# Patient Record
Sex: Female | Born: 1940 | Race: White | Hispanic: No | Smoking: Never smoker
Health system: Southern US, Community
[De-identification: ages and names within clinical notes are randomized; demographics above are authoritative.]

## PROBLEM LIST (undated history)

## (undated) DIAGNOSIS — E079 Disorder of thyroid, unspecified: Secondary | ICD-10-CM

## (undated) DIAGNOSIS — E785 Hyperlipidemia, unspecified: Secondary | ICD-10-CM

## (undated) DIAGNOSIS — J449 Chronic obstructive pulmonary disease, unspecified: Secondary | ICD-10-CM

## (undated) DIAGNOSIS — C55 Malignant neoplasm of uterus, part unspecified: Secondary | ICD-10-CM

## (undated) DIAGNOSIS — I10 Essential (primary) hypertension: Secondary | ICD-10-CM

## (undated) DIAGNOSIS — E039 Hypothyroidism, unspecified: Secondary | ICD-10-CM

## (undated) DIAGNOSIS — J45991 Cough variant asthma: Secondary | ICD-10-CM

## (undated) DIAGNOSIS — J45909 Unspecified asthma, uncomplicated: Secondary | ICD-10-CM

## (undated) DIAGNOSIS — J439 Emphysema, unspecified: Secondary | ICD-10-CM

## (undated) DIAGNOSIS — E119 Type 2 diabetes mellitus without complications: Secondary | ICD-10-CM

## (undated) HISTORY — PX: GANGLION CYST EXCISION: SHX1691

## (undated) HISTORY — PX: ABDOMINAL HYSTERECTOMY: SHX81

## (undated) HISTORY — DX: Type 2 diabetes mellitus without complications: E11.9

## (undated) HISTORY — DX: Unspecified asthma, uncomplicated: J45.909

## (undated) HISTORY — DX: Hyperlipidemia, unspecified: E78.5

## (undated) HISTORY — PX: CATARACT EXTRACTION, BILATERAL: SHX1313

## (undated) HISTORY — DX: Malignant neoplasm of uterus, part unspecified: C55

## (undated) HISTORY — PX: TONSILLECTOMY: SUR1361

## (undated) HISTORY — PX: TUBAL LIGATION: SHX77

## (undated) HISTORY — DX: Emphysema, unspecified: J43.9

## (undated) HISTORY — DX: Essential (primary) hypertension: I10

## (undated) HISTORY — DX: Cough variant asthma: J45.991

---

## 2005-07-07 ENCOUNTER — Other Ambulatory Visit: Payer: Self-pay

## 2005-07-07 ENCOUNTER — Ambulatory Visit: Payer: Self-pay | Admitting: Unknown Physician Specialty

## 2005-07-31 ENCOUNTER — Ambulatory Visit: Payer: Self-pay | Admitting: Unknown Physician Specialty

## 2006-12-15 IMAGING — MR MRI HEAD WITHOUT AND WITH CONTRAST
9 series · 48 of 48 positions shown · non-contrast
Comparison: none

REASON FOR EXAM: RIGHT-sided extremity numbness
COMMENTS:

[Series 2: t1_sag · axial · 10.0mm · 0.55mm/px · z∈[+0,+147]mm · 4 of 20 slices shown]
[im 1/20]
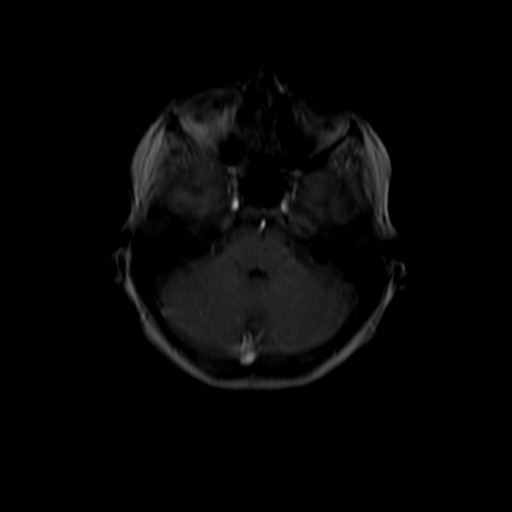
[im 7/20]
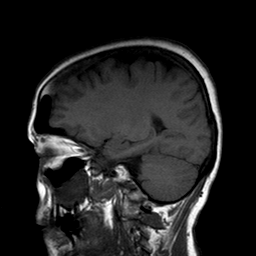
[im 13/20]
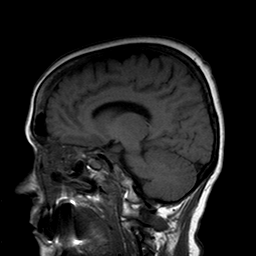
[im 20/20]
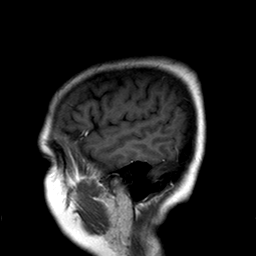

[Series 10: T1 · axial · 5.0mm · 0.90mm/px · z∈[-16,+147]mm · 5 of 24 slices shown (1 of 3)]
[im 1/24]
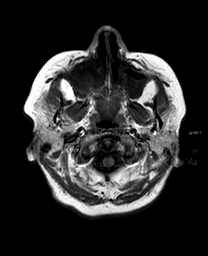
[im 6/24]
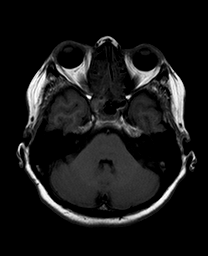
[im 12/24]
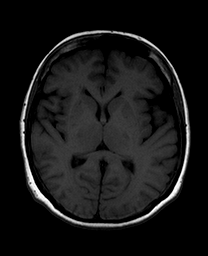
[im 18/24]
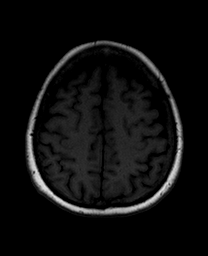
[im 24/24]
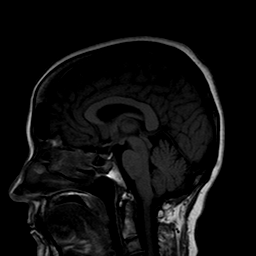

[Series 12: T2 · axial · 5.0mm · 0.45mm/px · z∈[-16,+147]mm · 5 of 24 slices shown]
[im 1/24]
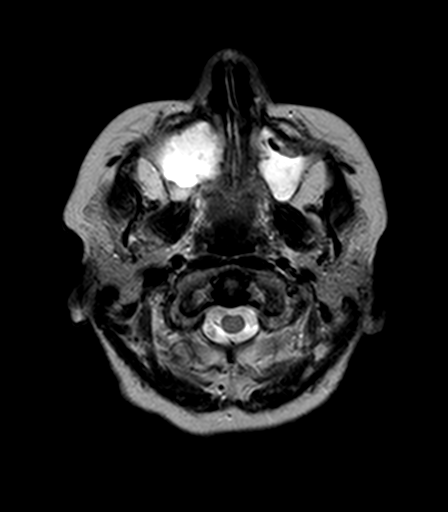
[im 6/24]
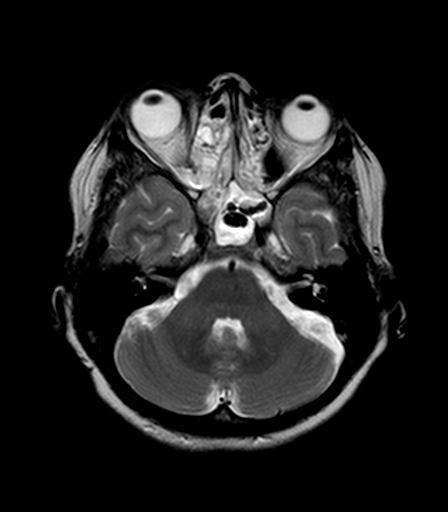
[im 12/24]
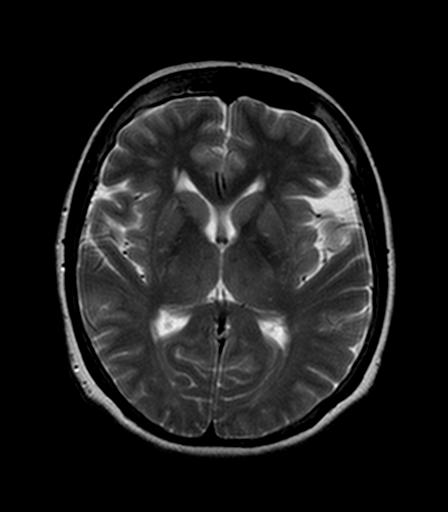
[im 18/24]
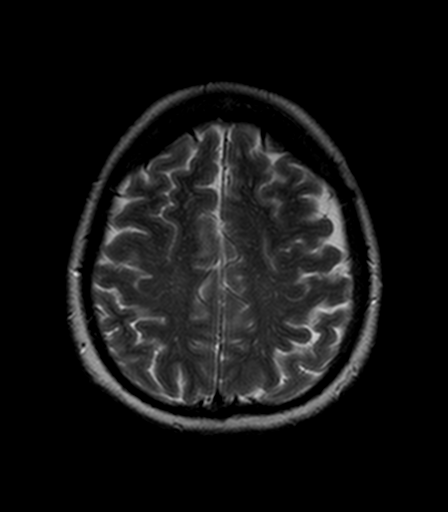
[im 24/24]
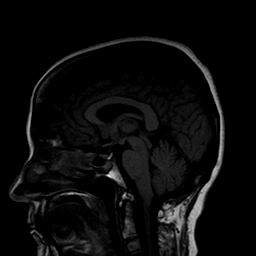

[Series 14: FLAIR · axial · 5.0mm · 0.90mm/px · z∈[-16,+147]mm · 6 of 24 slices shown]
[im 1/24]
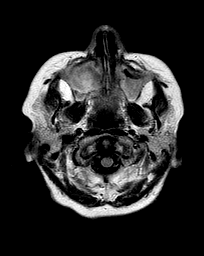
[im 5/24]
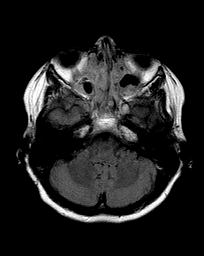
[im 10/24]
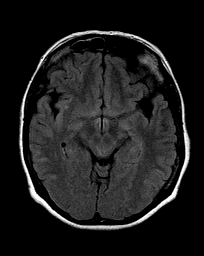
[im 14/24]
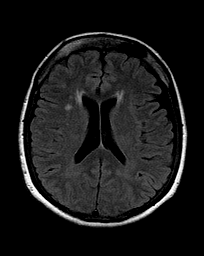
[im 19/24]
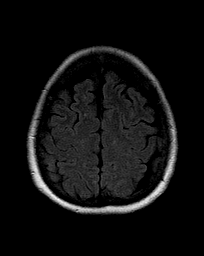
[im 24/24]
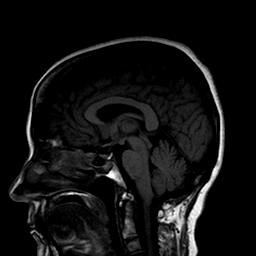

[Series 16: t1_cor · sagittal · 5.0mm · 0.90mm/px · 6 of 24 slices shown]
[im 1/24]
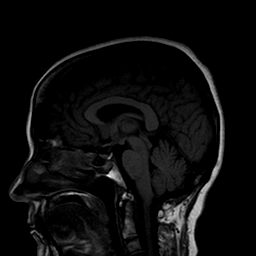
[im 5/24]
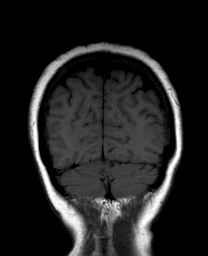
[im 10/24]
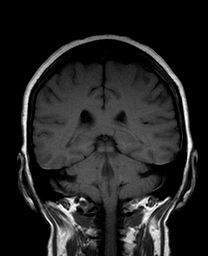
[im 14/24]
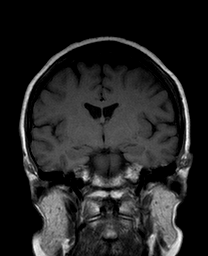
[im 19/24]
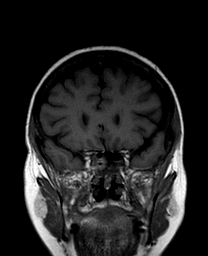
[im 24/24]
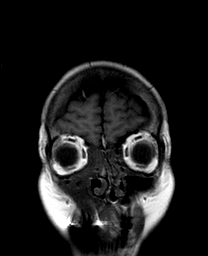

[Series 18: T1 · axial · 5.0mm · 0.45mm/px · z∈[-16,+147]mm · 6 of 24 slices shown (2 of 3)]
[im 1/24]
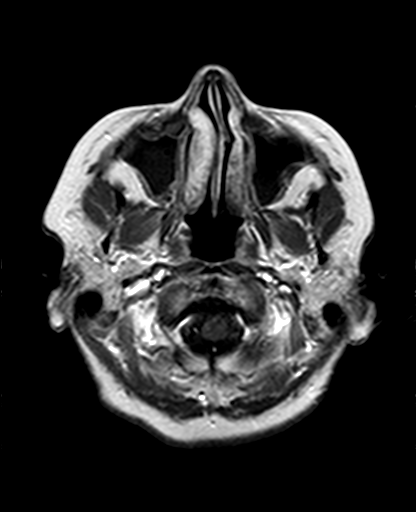
[im 5/24]
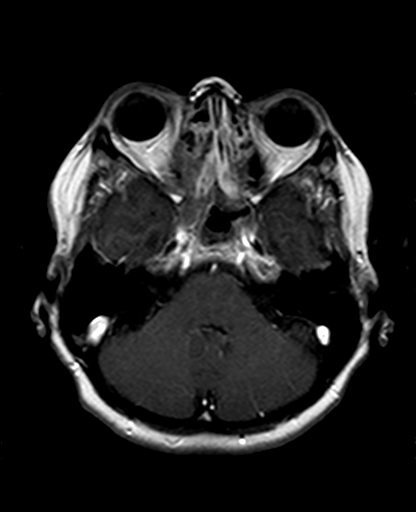
[im 10/24]
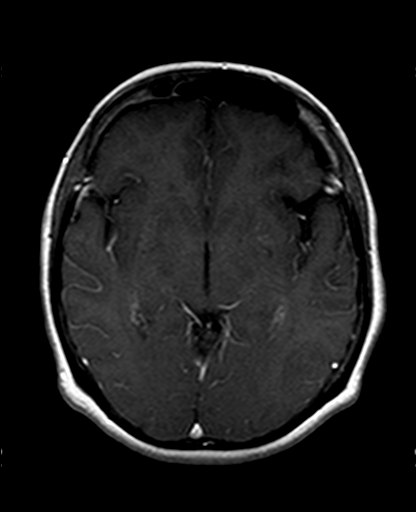
[im 14/24]
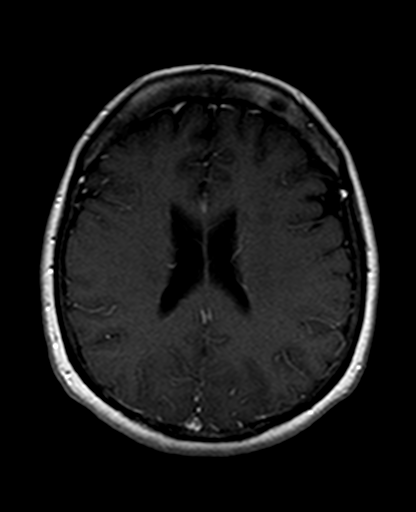
[im 19/24]
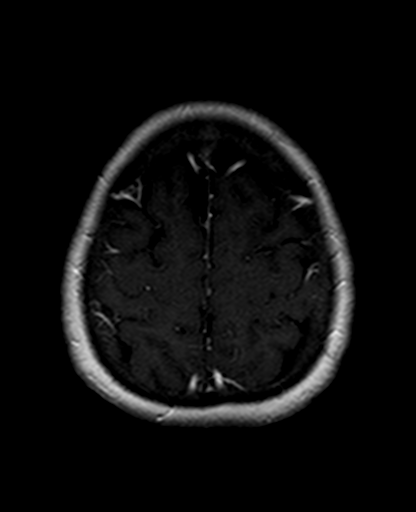
[im 24/24]
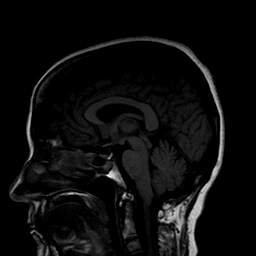

[Series 20: T1 · sagittal · 5.0mm · 0.90mm/px · 6 of 24 slices shown (3 of 3)]
[im 1/24]
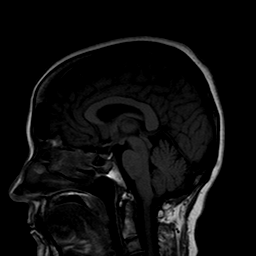
[im 5/24]
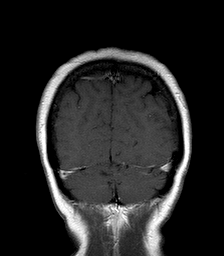
[im 10/24]
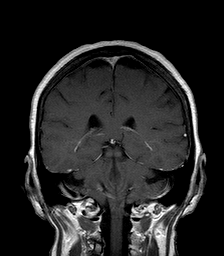
[im 14/24]
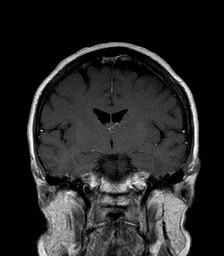
[im 19/24]
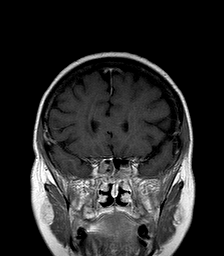
[im 24/24]
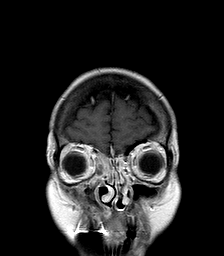

[Series 5002: DWI · axial · 5.0mm · 1.80mm/px · z∈[-16,+147]mm · 5 of 22 slices shown]
[im 1/22]
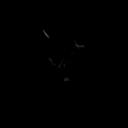
[im 6/22]
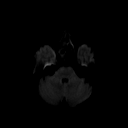
[im 11/22]
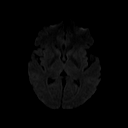
[im 16/22]
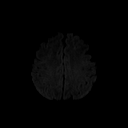
[im 22/22]
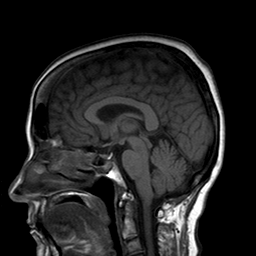

[Series 5003: ADC · axial · 5.0mm · 1.80mm/px · z∈[-16,+147]mm · 5 of 22 slices shown]
[im 1/22]
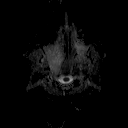
[im 6/22]
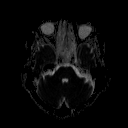
[im 11/22]
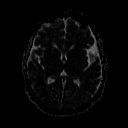
[im 16/22]
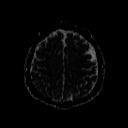
[im 22/22]
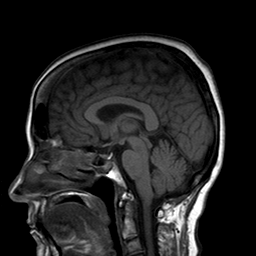

[48 of 48 positions shown; findings below may reference images not displayed]

PROCEDURE:     MR  - MR BRAIN WO/W CONTRAST  - July 07, 2005  [DATE]

RESULT:     Multiplanar/multisequence imaging of the brain was obtained
post-intravenous administration of 13 ml of IV Magnevist.

Evaluation of diffusion-weighted images demonstrates no evidence of
increased signal intensity to suggest sequela of an acute or subacute
infarct.  There is no evidence of intraaxial or extraaxial fluid collections
or evidence of abnormal parenchymal enhancement or enhancing masses.
Involutional changes are appreciated demonstrated by diffuse cortical
atrophy and areas of small vessel deep white matter and periventricular
white matter ischemia.  Considering the patient's age there does not appear
to be evidence of demyelinating or dysmyelinating disorders.  Diffuse T1
signal with concomitant decreased T2 signal is appreciated nearly completely
encompassing the RIGHT maxillary sinus.  A central area of enhancement is
identified and these findings appear to be consistent with RIGHT maxillary
sinusitis.  An air-fluid level is demonstrated within the LEFT maxillary
sinus with concomitant decreased T1, increased T2 signal and no evidence of
enhancement.  Air-fluid levels are also demonstrated within the sphenoid
sinuses and there is an opacification within the ethmoid air cells.

Evaluation of the sella and parasellar regions and structures demonstrate no
signal or enhancement abnormalities.  The cerebellum and midbrain regions as
well as cerebellopontine angle region and visualized portion of the seventh
and eighth cranial nerves demonstrate no signal or enhancement
abnormalities.  There is no evidence of free fluid or enhancing masses in
the region of the cerebellopontine angle regions. There does not appear to
be MR evidence to suggest sequela of mastoiditis.
IMPRESSION: Involutional changes as described above.

Findings which appear to be consistent with pansinusitis.

## 2011-04-02 DIAGNOSIS — H2510 Age-related nuclear cataract, unspecified eye: Secondary | ICD-10-CM | POA: Diagnosis not present

## 2011-04-13 ENCOUNTER — Ambulatory Visit: Payer: Self-pay | Admitting: Ophthalmology

## 2011-04-13 DIAGNOSIS — Z01810 Encounter for preprocedural cardiovascular examination: Secondary | ICD-10-CM | POA: Diagnosis not present

## 2011-04-13 DIAGNOSIS — I10 Essential (primary) hypertension: Secondary | ICD-10-CM

## 2011-04-13 DIAGNOSIS — Z01812 Encounter for preprocedural laboratory examination: Secondary | ICD-10-CM | POA: Diagnosis not present

## 2011-04-13 DIAGNOSIS — H2510 Age-related nuclear cataract, unspecified eye: Secondary | ICD-10-CM | POA: Diagnosis not present

## 2011-04-13 LAB — POTASSIUM: Potassium: 4.8 mmol/L (ref 3.5–5.1)

## 2011-04-21 ENCOUNTER — Ambulatory Visit: Payer: Self-pay | Admitting: Ophthalmology

## 2011-04-21 DIAGNOSIS — I10 Essential (primary) hypertension: Secondary | ICD-10-CM | POA: Diagnosis not present

## 2011-04-21 DIAGNOSIS — J45909 Unspecified asthma, uncomplicated: Secondary | ICD-10-CM | POA: Diagnosis not present

## 2011-04-21 DIAGNOSIS — Z9851 Tubal ligation status: Secondary | ICD-10-CM | POA: Diagnosis not present

## 2011-04-21 DIAGNOSIS — Z79899 Other long term (current) drug therapy: Secondary | ICD-10-CM | POA: Diagnosis not present

## 2011-04-21 DIAGNOSIS — Z9889 Other specified postprocedural states: Secondary | ICD-10-CM | POA: Diagnosis not present

## 2011-04-21 DIAGNOSIS — H269 Unspecified cataract: Secondary | ICD-10-CM | POA: Diagnosis not present

## 2011-04-21 DIAGNOSIS — H2510 Age-related nuclear cataract, unspecified eye: Secondary | ICD-10-CM | POA: Diagnosis not present

## 2011-06-02 DIAGNOSIS — H2510 Age-related nuclear cataract, unspecified eye: Secondary | ICD-10-CM | POA: Diagnosis not present

## 2011-06-04 DIAGNOSIS — J330 Polyp of nasal cavity: Secondary | ICD-10-CM | POA: Diagnosis not present

## 2011-06-04 DIAGNOSIS — R05 Cough: Secondary | ICD-10-CM | POA: Diagnosis not present

## 2011-06-04 DIAGNOSIS — R059 Cough, unspecified: Secondary | ICD-10-CM | POA: Diagnosis not present

## 2011-06-04 DIAGNOSIS — J45909 Unspecified asthma, uncomplicated: Secondary | ICD-10-CM | POA: Diagnosis not present

## 2011-06-09 ENCOUNTER — Ambulatory Visit: Payer: Self-pay | Admitting: Ophthalmology

## 2011-06-09 DIAGNOSIS — H2510 Age-related nuclear cataract, unspecified eye: Secondary | ICD-10-CM | POA: Diagnosis not present

## 2011-06-09 DIAGNOSIS — H269 Unspecified cataract: Secondary | ICD-10-CM | POA: Diagnosis not present

## 2011-06-09 DIAGNOSIS — I10 Essential (primary) hypertension: Secondary | ICD-10-CM | POA: Diagnosis not present

## 2011-06-09 DIAGNOSIS — I498 Other specified cardiac arrhythmias: Secondary | ICD-10-CM | POA: Diagnosis not present

## 2011-06-09 DIAGNOSIS — Z79899 Other long term (current) drug therapy: Secondary | ICD-10-CM | POA: Diagnosis not present

## 2011-06-09 DIAGNOSIS — Z9109 Other allergy status, other than to drugs and biological substances: Secondary | ICD-10-CM | POA: Diagnosis not present

## 2011-06-09 DIAGNOSIS — J438 Other emphysema: Secondary | ICD-10-CM | POA: Diagnosis not present

## 2011-06-11 DIAGNOSIS — R059 Cough, unspecified: Secondary | ICD-10-CM | POA: Diagnosis not present

## 2011-06-11 DIAGNOSIS — J330 Polyp of nasal cavity: Secondary | ICD-10-CM | POA: Diagnosis not present

## 2011-06-11 DIAGNOSIS — R05 Cough: Secondary | ICD-10-CM | POA: Diagnosis not present

## 2011-06-11 DIAGNOSIS — J45909 Unspecified asthma, uncomplicated: Secondary | ICD-10-CM | POA: Diagnosis not present

## 2011-06-18 DIAGNOSIS — L989 Disorder of the skin and subcutaneous tissue, unspecified: Secondary | ICD-10-CM | POA: Diagnosis not present

## 2011-06-18 DIAGNOSIS — B354 Tinea corporis: Secondary | ICD-10-CM | POA: Diagnosis not present

## 2011-06-18 DIAGNOSIS — D485 Neoplasm of uncertain behavior of skin: Secondary | ICD-10-CM | POA: Diagnosis not present

## 2011-06-25 DIAGNOSIS — S82853A Displaced trimalleolar fracture of unspecified lower leg, initial encounter for closed fracture: Secondary | ICD-10-CM | POA: Diagnosis not present

## 2011-06-26 ENCOUNTER — Inpatient Hospital Stay: Payer: Self-pay | Admitting: Orthopedic Surgery

## 2011-06-26 DIAGNOSIS — S82853A Displaced trimalleolar fracture of unspecified lower leg, initial encounter for closed fracture: Secondary | ICD-10-CM | POA: Diagnosis not present

## 2011-06-26 DIAGNOSIS — I10 Essential (primary) hypertension: Secondary | ICD-10-CM | POA: Diagnosis not present

## 2011-06-26 DIAGNOSIS — IMO0001 Reserved for inherently not codable concepts without codable children: Secondary | ICD-10-CM | POA: Diagnosis not present

## 2011-06-26 DIAGNOSIS — M25579 Pain in unspecified ankle and joints of unspecified foot: Secondary | ICD-10-CM | POA: Diagnosis not present

## 2011-06-26 DIAGNOSIS — Z79899 Other long term (current) drug therapy: Secondary | ICD-10-CM | POA: Diagnosis not present

## 2011-06-26 DIAGNOSIS — R269 Unspecified abnormalities of gait and mobility: Secondary | ICD-10-CM | POA: Diagnosis not present

## 2011-06-26 DIAGNOSIS — Z4789 Encounter for other orthopedic aftercare: Secondary | ICD-10-CM | POA: Diagnosis not present

## 2011-06-26 DIAGNOSIS — Z9889 Other specified postprocedural states: Secondary | ICD-10-CM | POA: Diagnosis not present

## 2011-06-26 DIAGNOSIS — M810 Age-related osteoporosis without current pathological fracture: Secondary | ICD-10-CM | POA: Diagnosis not present

## 2011-06-26 DIAGNOSIS — Z5189 Encounter for other specified aftercare: Secondary | ICD-10-CM | POA: Diagnosis not present

## 2011-06-26 DIAGNOSIS — J45909 Unspecified asthma, uncomplicated: Secondary | ICD-10-CM | POA: Diagnosis present

## 2011-06-26 DIAGNOSIS — S82899A Other fracture of unspecified lower leg, initial encounter for closed fracture: Secondary | ICD-10-CM | POA: Diagnosis not present

## 2011-06-26 DIAGNOSIS — M6281 Muscle weakness (generalized): Secondary | ICD-10-CM | POA: Diagnosis not present

## 2011-06-26 DIAGNOSIS — R112 Nausea with vomiting, unspecified: Secondary | ICD-10-CM | POA: Diagnosis not present

## 2011-06-26 DIAGNOSIS — Z043 Encounter for examination and observation following other accident: Secondary | ICD-10-CM | POA: Diagnosis not present

## 2011-06-26 DIAGNOSIS — Z9181 History of falling: Secondary | ICD-10-CM | POA: Diagnosis not present

## 2011-06-26 LAB — CBC WITH DIFFERENTIAL/PLATELET
Basophil #: 0 10*3/uL (ref 0.0–0.1)
Basophil %: 0.4 %
Eosinophil #: 0.1 10*3/uL (ref 0.0–0.7)
Eosinophil %: 2.3 %
HCT: 32.4 % — ABNORMAL LOW (ref 35.0–47.0)
HGB: 10.7 g/dL — ABNORMAL LOW (ref 12.0–16.0)
Lymphocyte #: 0.8 10*3/uL — ABNORMAL LOW (ref 1.0–3.6)
Lymphocyte %: 12.6 %
MCH: 29.1 pg (ref 26.0–34.0)
MCHC: 33.1 g/dL (ref 32.0–36.0)
MCV: 88 fL (ref 80–100)
Monocyte #: 0.5 x10 3/mm (ref 0.2–0.9)
Monocyte %: 7.5 %
Neutrophil #: 4.8 10*3/uL (ref 1.4–6.5)
Neutrophil %: 77.2 %
Platelet: 239 10*3/uL (ref 150–440)
RBC: 3.69 10*6/uL — ABNORMAL LOW (ref 3.80–5.20)
RDW: 14.3 % (ref 11.5–14.5)
WBC: 6.2 10*3/uL (ref 3.6–11.0)

## 2011-06-26 LAB — COMPREHENSIVE METABOLIC PANEL
Albumin: 3.1 g/dL — ABNORMAL LOW (ref 3.4–5.0)
Alkaline Phosphatase: 71 U/L (ref 50–136)
Anion Gap: 9 (ref 7–16)
BUN: 17 mg/dL (ref 7–18)
Bilirubin,Total: 0.3 mg/dL (ref 0.2–1.0)
Calcium, Total: 8.7 mg/dL (ref 8.5–10.1)
Chloride: 101 mmol/L (ref 98–107)
Co2: 26 mmol/L (ref 21–32)
Creatinine: 0.98 mg/dL (ref 0.60–1.30)
EGFR (African American): >60
EGFR (Non-African Amer.): 58 — ABNORMAL LOW
Glucose: 126 mg/dL — ABNORMAL HIGH (ref 65–99)
Osmolality: 275 (ref 275–301)
Potassium: 3.7 mmol/L (ref 3.5–5.1)
SGOT(AST): 20 U/L (ref 15–37)
SGPT (ALT): 24 U/L
Sodium: 136 mmol/L (ref 136–145)
Total Protein: 6.6 g/dL (ref 6.4–8.2)

## 2011-06-26 LAB — PROTIME-INR
INR: 0.8
Prothrombin Time: 11.7 secs (ref 11.5–14.7)

## 2011-06-29 DIAGNOSIS — S82899A Other fracture of unspecified lower leg, initial encounter for closed fracture: Secondary | ICD-10-CM | POA: Diagnosis not present

## 2011-06-29 DIAGNOSIS — M810 Age-related osteoporosis without current pathological fracture: Secondary | ICD-10-CM | POA: Diagnosis not present

## 2011-06-29 DIAGNOSIS — Z4789 Encounter for other orthopedic aftercare: Secondary | ICD-10-CM | POA: Diagnosis not present

## 2011-06-29 DIAGNOSIS — Z5189 Encounter for other specified aftercare: Secondary | ICD-10-CM | POA: Diagnosis not present

## 2011-06-29 DIAGNOSIS — J45902 Unspecified asthma with status asthmaticus: Secondary | ICD-10-CM | POA: Diagnosis not present

## 2011-06-29 DIAGNOSIS — Z9181 History of falling: Secondary | ICD-10-CM | POA: Diagnosis not present

## 2011-06-29 DIAGNOSIS — M6281 Muscle weakness (generalized): Secondary | ICD-10-CM | POA: Diagnosis not present

## 2011-06-29 DIAGNOSIS — I10 Essential (primary) hypertension: Secondary | ICD-10-CM | POA: Diagnosis not present

## 2011-06-29 DIAGNOSIS — J45909 Unspecified asthma, uncomplicated: Secondary | ICD-10-CM | POA: Diagnosis not present

## 2011-06-29 DIAGNOSIS — IMO0001 Reserved for inherently not codable concepts without codable children: Secondary | ICD-10-CM | POA: Diagnosis not present

## 2011-06-29 DIAGNOSIS — R269 Unspecified abnormalities of gait and mobility: Secondary | ICD-10-CM | POA: Diagnosis not present

## 2011-06-30 ENCOUNTER — Encounter: Payer: Self-pay | Admitting: Internal Medicine

## 2011-07-01 DIAGNOSIS — J45909 Unspecified asthma, uncomplicated: Secondary | ICD-10-CM | POA: Diagnosis not present

## 2011-07-01 DIAGNOSIS — M810 Age-related osteoporosis without current pathological fracture: Secondary | ICD-10-CM | POA: Diagnosis not present

## 2011-07-01 DIAGNOSIS — I10 Essential (primary) hypertension: Secondary | ICD-10-CM | POA: Diagnosis not present

## 2011-07-08 DIAGNOSIS — S82899A Other fracture of unspecified lower leg, initial encounter for closed fracture: Secondary | ICD-10-CM | POA: Diagnosis not present

## 2011-07-18 ENCOUNTER — Encounter: Payer: Self-pay | Admitting: Internal Medicine

## 2011-07-28 DIAGNOSIS — J45902 Unspecified asthma with status asthmaticus: Secondary | ICD-10-CM | POA: Diagnosis not present

## 2011-07-29 DIAGNOSIS — S82899A Other fracture of unspecified lower leg, initial encounter for closed fracture: Secondary | ICD-10-CM | POA: Diagnosis not present

## 2011-08-06 DIAGNOSIS — I10 Essential (primary) hypertension: Secondary | ICD-10-CM | POA: Diagnosis not present

## 2011-08-06 DIAGNOSIS — IMO0001 Reserved for inherently not codable concepts without codable children: Secondary | ICD-10-CM | POA: Diagnosis not present

## 2011-08-06 DIAGNOSIS — S8290XD Unspecified fracture of unspecified lower leg, subsequent encounter for closed fracture with routine healing: Secondary | ICD-10-CM | POA: Diagnosis not present

## 2011-08-06 DIAGNOSIS — R269 Unspecified abnormalities of gait and mobility: Secondary | ICD-10-CM | POA: Diagnosis not present

## 2011-08-11 DIAGNOSIS — I10 Essential (primary) hypertension: Secondary | ICD-10-CM | POA: Diagnosis not present

## 2011-08-11 DIAGNOSIS — S8290XD Unspecified fracture of unspecified lower leg, subsequent encounter for closed fracture with routine healing: Secondary | ICD-10-CM | POA: Diagnosis not present

## 2011-08-11 DIAGNOSIS — R269 Unspecified abnormalities of gait and mobility: Secondary | ICD-10-CM | POA: Diagnosis not present

## 2011-08-11 DIAGNOSIS — IMO0001 Reserved for inherently not codable concepts without codable children: Secondary | ICD-10-CM | POA: Diagnosis not present

## 2011-08-12 DIAGNOSIS — IMO0001 Reserved for inherently not codable concepts without codable children: Secondary | ICD-10-CM | POA: Diagnosis not present

## 2011-08-12 DIAGNOSIS — R269 Unspecified abnormalities of gait and mobility: Secondary | ICD-10-CM | POA: Diagnosis not present

## 2011-08-12 DIAGNOSIS — S8290XD Unspecified fracture of unspecified lower leg, subsequent encounter for closed fracture with routine healing: Secondary | ICD-10-CM | POA: Diagnosis not present

## 2011-08-12 DIAGNOSIS — I10 Essential (primary) hypertension: Secondary | ICD-10-CM | POA: Diagnosis not present

## 2011-08-12 DIAGNOSIS — S82899A Other fracture of unspecified lower leg, initial encounter for closed fracture: Secondary | ICD-10-CM | POA: Diagnosis not present

## 2011-08-14 DIAGNOSIS — IMO0001 Reserved for inherently not codable concepts without codable children: Secondary | ICD-10-CM | POA: Diagnosis not present

## 2011-08-14 DIAGNOSIS — I10 Essential (primary) hypertension: Secondary | ICD-10-CM | POA: Diagnosis not present

## 2011-08-14 DIAGNOSIS — S8290XD Unspecified fracture of unspecified lower leg, subsequent encounter for closed fracture with routine healing: Secondary | ICD-10-CM | POA: Diagnosis not present

## 2011-08-14 DIAGNOSIS — R269 Unspecified abnormalities of gait and mobility: Secondary | ICD-10-CM | POA: Diagnosis not present

## 2011-08-17 DIAGNOSIS — R269 Unspecified abnormalities of gait and mobility: Secondary | ICD-10-CM | POA: Diagnosis not present

## 2011-08-17 DIAGNOSIS — I10 Essential (primary) hypertension: Secondary | ICD-10-CM | POA: Diagnosis not present

## 2011-08-17 DIAGNOSIS — S8290XD Unspecified fracture of unspecified lower leg, subsequent encounter for closed fracture with routine healing: Secondary | ICD-10-CM | POA: Diagnosis not present

## 2011-08-17 DIAGNOSIS — IMO0001 Reserved for inherently not codable concepts without codable children: Secondary | ICD-10-CM | POA: Diagnosis not present

## 2011-08-19 DIAGNOSIS — S8290XD Unspecified fracture of unspecified lower leg, subsequent encounter for closed fracture with routine healing: Secondary | ICD-10-CM | POA: Diagnosis not present

## 2011-08-19 DIAGNOSIS — I10 Essential (primary) hypertension: Secondary | ICD-10-CM | POA: Diagnosis not present

## 2011-08-19 DIAGNOSIS — IMO0001 Reserved for inherently not codable concepts without codable children: Secondary | ICD-10-CM | POA: Diagnosis not present

## 2011-08-19 DIAGNOSIS — R269 Unspecified abnormalities of gait and mobility: Secondary | ICD-10-CM | POA: Diagnosis not present

## 2011-08-21 DIAGNOSIS — S8290XD Unspecified fracture of unspecified lower leg, subsequent encounter for closed fracture with routine healing: Secondary | ICD-10-CM | POA: Diagnosis not present

## 2011-08-21 DIAGNOSIS — R269 Unspecified abnormalities of gait and mobility: Secondary | ICD-10-CM | POA: Diagnosis not present

## 2011-08-21 DIAGNOSIS — I10 Essential (primary) hypertension: Secondary | ICD-10-CM | POA: Diagnosis not present

## 2011-08-21 DIAGNOSIS — IMO0001 Reserved for inherently not codable concepts without codable children: Secondary | ICD-10-CM | POA: Diagnosis not present

## 2011-08-26 DIAGNOSIS — S8290XD Unspecified fracture of unspecified lower leg, subsequent encounter for closed fracture with routine healing: Secondary | ICD-10-CM | POA: Diagnosis not present

## 2011-08-27 DIAGNOSIS — S8290XD Unspecified fracture of unspecified lower leg, subsequent encounter for closed fracture with routine healing: Secondary | ICD-10-CM | POA: Diagnosis not present

## 2011-08-27 DIAGNOSIS — R269 Unspecified abnormalities of gait and mobility: Secondary | ICD-10-CM | POA: Diagnosis not present

## 2011-08-27 DIAGNOSIS — IMO0001 Reserved for inherently not codable concepts without codable children: Secondary | ICD-10-CM | POA: Diagnosis not present

## 2011-08-27 DIAGNOSIS — I10 Essential (primary) hypertension: Secondary | ICD-10-CM | POA: Diagnosis not present

## 2011-11-27 DIAGNOSIS — Z0000 Encounter for general adult medical examination without abnormal findings: Secondary | ICD-10-CM | POA: Diagnosis not present

## 2011-11-27 DIAGNOSIS — M899 Disorder of bone, unspecified: Secondary | ICD-10-CM | POA: Diagnosis not present

## 2011-11-27 DIAGNOSIS — J45909 Unspecified asthma, uncomplicated: Secondary | ICD-10-CM | POA: Diagnosis not present

## 2011-11-27 DIAGNOSIS — Z01419 Encounter for gynecological examination (general) (routine) without abnormal findings: Secondary | ICD-10-CM | POA: Diagnosis not present

## 2011-11-27 DIAGNOSIS — E782 Mixed hyperlipidemia: Secondary | ICD-10-CM | POA: Diagnosis not present

## 2011-11-27 DIAGNOSIS — I10 Essential (primary) hypertension: Secondary | ICD-10-CM | POA: Diagnosis not present

## 2012-01-01 DIAGNOSIS — Z1231 Encounter for screening mammogram for malignant neoplasm of breast: Secondary | ICD-10-CM | POA: Diagnosis not present

## 2012-02-24 DIAGNOSIS — R0602 Shortness of breath: Secondary | ICD-10-CM | POA: Diagnosis not present

## 2012-02-24 DIAGNOSIS — J330 Polyp of nasal cavity: Secondary | ICD-10-CM | POA: Diagnosis not present

## 2012-02-24 DIAGNOSIS — J45909 Unspecified asthma, uncomplicated: Secondary | ICD-10-CM | POA: Diagnosis not present

## 2012-03-03 DIAGNOSIS — R0602 Shortness of breath: Secondary | ICD-10-CM | POA: Diagnosis not present

## 2012-03-03 DIAGNOSIS — H04129 Dry eye syndrome of unspecified lacrimal gland: Secondary | ICD-10-CM | POA: Diagnosis not present

## 2012-03-03 DIAGNOSIS — J45909 Unspecified asthma, uncomplicated: Secondary | ICD-10-CM | POA: Diagnosis not present

## 2012-04-14 DIAGNOSIS — J45909 Unspecified asthma, uncomplicated: Secondary | ICD-10-CM | POA: Diagnosis not present

## 2012-04-14 DIAGNOSIS — J328 Other chronic sinusitis: Secondary | ICD-10-CM | POA: Diagnosis not present

## 2012-04-14 DIAGNOSIS — R0602 Shortness of breath: Secondary | ICD-10-CM | POA: Diagnosis not present

## 2012-05-26 DIAGNOSIS — J330 Polyp of nasal cavity: Secondary | ICD-10-CM | POA: Diagnosis not present

## 2012-05-26 DIAGNOSIS — J328 Other chronic sinusitis: Secondary | ICD-10-CM | POA: Diagnosis not present

## 2012-05-26 DIAGNOSIS — J45909 Unspecified asthma, uncomplicated: Secondary | ICD-10-CM | POA: Diagnosis not present

## 2012-08-05 ENCOUNTER — Ambulatory Visit: Payer: Self-pay | Admitting: Internal Medicine

## 2012-08-05 DIAGNOSIS — Z79899 Other long term (current) drug therapy: Secondary | ICD-10-CM | POA: Diagnosis not present

## 2012-08-05 DIAGNOSIS — J45909 Unspecified asthma, uncomplicated: Secondary | ICD-10-CM | POA: Diagnosis not present

## 2012-08-05 DIAGNOSIS — I10 Essential (primary) hypertension: Secondary | ICD-10-CM | POA: Diagnosis not present

## 2012-08-05 DIAGNOSIS — K5289 Other specified noninfective gastroenteritis and colitis: Secondary | ICD-10-CM | POA: Diagnosis not present

## 2012-08-05 LAB — CBC WITH DIFFERENTIAL/PLATELET
Basophil #: 0.1 10*3/uL (ref 0.0–0.1)
Basophil %: 0.7 %
Eosinophil #: 0.5 10*3/uL (ref 0.0–0.7)
Eosinophil %: 5.1 %
HCT: 40 % (ref 35.0–47.0)
HGB: 13.1 g/dL (ref 12.0–16.0)
Lymphocyte #: 1.5 10*3/uL (ref 1.0–3.6)
Lymphocyte %: 16.4 %
MCH: 27.8 pg (ref 26.0–34.0)
MCHC: 32.6 g/dL (ref 32.0–36.0)
MCV: 85 fL (ref 80–100)
Monocyte #: 0.6 x10 3/mm (ref 0.2–0.9)
Monocyte %: 6.3 %
Neutrophil #: 6.5 10*3/uL (ref 1.4–6.5)
Neutrophil %: 71.5 %
Platelet: 479 10*3/uL — ABNORMAL HIGH (ref 150–440)
RBC: 4.7 10*6/uL (ref 3.80–5.20)
RDW: 15.2 % — ABNORMAL HIGH (ref 11.5–14.5)
WBC: 9.2 10*3/uL (ref 3.6–11.0)

## 2012-08-05 LAB — COMPREHENSIVE METABOLIC PANEL
Albumin: 3.6 g/dL (ref 3.4–5.0)
Alkaline Phosphatase: 97 U/L (ref 50–136)
Anion Gap: 11 (ref 7–16)
BUN: 16 mg/dL (ref 7–18)
Bilirubin,Total: 0.3 mg/dL (ref 0.2–1.0)
Calcium, Total: 9.2 mg/dL (ref 8.5–10.1)
Chloride: 101 mmol/L (ref 98–107)
Co2: 25 mmol/L (ref 21–32)
Creatinine: 0.93 mg/dL (ref 0.60–1.30)
EGFR (African American): >60
EGFR (Non-African Amer.): >60
Glucose: 113 mg/dL — ABNORMAL HIGH (ref 65–99)
Osmolality: 276 (ref 275–301)
Potassium: 3.4 mmol/L — ABNORMAL LOW (ref 3.5–5.1)
SGOT(AST): 20 U/L (ref 15–37)
SGPT (ALT): 24 U/L (ref 12–78)
Sodium: 137 mmol/L (ref 136–145)
Total Protein: 7.1 g/dL (ref 6.4–8.2)

## 2012-08-05 LAB — URINALYSIS, COMPLETE
Bilirubin,UR: NEGATIVE
Blood: NEGATIVE
Glucose,UR: NEGATIVE mg/dL (ref 0–75)
Ketone: NEGATIVE
Leukocyte Esterase: NEGATIVE
Nitrite: NEGATIVE
Ph: 6.5 (ref 4.5–8.0)
Specific Gravity: 1.025 (ref 1.003–1.030)

## 2012-08-07 LAB — URINE CULTURE

## 2012-08-09 ENCOUNTER — Ambulatory Visit: Payer: Self-pay | Admitting: Family Medicine

## 2012-08-09 DIAGNOSIS — R0602 Shortness of breath: Secondary | ICD-10-CM | POA: Diagnosis not present

## 2012-08-09 DIAGNOSIS — E860 Dehydration: Secondary | ICD-10-CM | POA: Diagnosis not present

## 2012-08-09 DIAGNOSIS — J45909 Unspecified asthma, uncomplicated: Secondary | ICD-10-CM | POA: Diagnosis not present

## 2012-08-09 DIAGNOSIS — I10 Essential (primary) hypertension: Secondary | ICD-10-CM | POA: Diagnosis not present

## 2012-08-09 DIAGNOSIS — E878 Other disorders of electrolyte and fluid balance, not elsewhere classified: Secondary | ICD-10-CM | POA: Diagnosis not present

## 2012-08-09 DIAGNOSIS — R197 Diarrhea, unspecified: Secondary | ICD-10-CM | POA: Diagnosis not present

## 2012-08-09 LAB — CBC WITH DIFFERENTIAL/PLATELET
Basophil #: 0 10*3/uL (ref 0.0–0.1)
Basophil %: 0.2 %
Eosinophil #: 1 10*3/uL — ABNORMAL HIGH (ref 0.0–0.7)
Eosinophil %: 9.2 %
HCT: 40.1 % (ref 35.0–47.0)
HGB: 13.2 g/dL (ref 12.0–16.0)
Lymphocyte #: 1 10*3/uL (ref 1.0–3.6)
Lymphocyte %: 9.2 %
MCH: 28 pg (ref 26.0–34.0)
MCHC: 32.9 g/dL (ref 32.0–36.0)
MCV: 85 fL (ref 80–100)
Monocyte #: 0.6 x10 3/mm (ref 0.2–0.9)
Monocyte %: 5.5 %
Neutrophil #: 8.3 10*3/uL — ABNORMAL HIGH (ref 1.4–6.5)
Neutrophil %: 75.9 %
Platelet: 417 10*3/uL (ref 150–440)
RBC: 4.71 10*6/uL (ref 3.80–5.20)
RDW: 15.2 % — ABNORMAL HIGH (ref 11.5–14.5)
WBC: 10.9 10*3/uL (ref 3.6–11.0)

## 2012-08-09 LAB — COMPREHENSIVE METABOLIC PANEL
Albumin: 3.8 g/dL (ref 3.4–5.0)
Alkaline Phosphatase: 129 U/L (ref 50–136)
Anion Gap: 14 (ref 7–16)
BUN: 19 mg/dL — ABNORMAL HIGH (ref 7–18)
Bilirubin,Total: 0.3 mg/dL (ref 0.2–1.0)
Calcium, Total: 10 mg/dL (ref 8.5–10.1)
Chloride: 97 mmol/L — ABNORMAL LOW (ref 98–107)
Co2: 21 mmol/L (ref 21–32)
Creatinine: 1.58 mg/dL — ABNORMAL HIGH (ref 0.60–1.30)
EGFR (African American): 37 — ABNORMAL LOW
EGFR (Non-African Amer.): 32 — ABNORMAL LOW
Glucose: 112 mg/dL — ABNORMAL HIGH (ref 65–99)
Osmolality: 268 (ref 275–301)
Potassium: 3.3 mmol/L — ABNORMAL LOW (ref 3.5–5.1)
SGOT(AST): 21 U/L (ref 15–37)
SGPT (ALT): 23 U/L (ref 12–78)
Sodium: 132 mmol/L — ABNORMAL LOW (ref 136–145)
Total Protein: 7.3 g/dL (ref 6.4–8.2)

## 2012-08-12 ENCOUNTER — Ambulatory Visit: Payer: Self-pay | Admitting: Family Medicine

## 2012-08-12 DIAGNOSIS — R197 Diarrhea, unspecified: Secondary | ICD-10-CM | POA: Diagnosis not present

## 2012-08-13 ENCOUNTER — Ambulatory Visit: Payer: Self-pay | Admitting: Family Medicine

## 2012-08-13 DIAGNOSIS — R197 Diarrhea, unspecified: Secondary | ICD-10-CM | POA: Diagnosis not present

## 2012-08-13 DIAGNOSIS — I10 Essential (primary) hypertension: Secondary | ICD-10-CM | POA: Diagnosis not present

## 2012-08-13 DIAGNOSIS — Z79899 Other long term (current) drug therapy: Secondary | ICD-10-CM | POA: Diagnosis not present

## 2012-08-13 DIAGNOSIS — J45909 Unspecified asthma, uncomplicated: Secondary | ICD-10-CM | POA: Diagnosis not present

## 2012-08-13 LAB — COMPREHENSIVE METABOLIC PANEL
Albumin: 3.5 g/dL (ref 3.4–5.0)
Alkaline Phosphatase: 84 U/L (ref 50–136)
Anion Gap: 11 (ref 7–16)
BUN: 23 mg/dL — ABNORMAL HIGH (ref 7–18)
Bilirubin,Total: 0.2 mg/dL (ref 0.2–1.0)
Calcium, Total: 9 mg/dL (ref 8.5–10.1)
Chloride: 102 mmol/L (ref 98–107)
Co2: 25 mmol/L (ref 21–32)
Creatinine: 0.98 mg/dL (ref 0.60–1.30)
EGFR (African American): >60
EGFR (Non-African Amer.): 57 — ABNORMAL LOW
Glucose: 95 mg/dL (ref 65–99)
Osmolality: 279 (ref 275–301)
Potassium: 3.6 mmol/L (ref 3.5–5.1)
SGOT(AST): 14 U/L — ABNORMAL LOW (ref 15–37)
SGPT (ALT): 23 U/L (ref 12–78)
Sodium: 138 mmol/L (ref 136–145)
Total Protein: 6.6 g/dL (ref 6.4–8.2)

## 2012-08-13 LAB — CBC WITH DIFFERENTIAL/PLATELET
Basophil #: 0.1 10*3/uL (ref 0.0–0.1)
Basophil %: 0.6 %
Eosinophil #: 0.4 10*3/uL (ref 0.0–0.7)
Eosinophil %: 3.8 %
HCT: 37.7 % (ref 35.0–47.0)
HGB: 12.3 g/dL (ref 12.0–16.0)
Lymphocyte #: 2.7 10*3/uL (ref 1.0–3.6)
Lymphocyte %: 28.8 %
MCH: 27.9 pg (ref 26.0–34.0)
MCHC: 32.5 g/dL (ref 32.0–36.0)
MCV: 86 fL (ref 80–100)
Monocyte #: 0.7 x10 3/mm (ref 0.2–0.9)
Monocyte %: 6.9 %
Neutrophil #: 5.7 10*3/uL (ref 1.4–6.5)
Neutrophil %: 59.9 %
Platelet: 360 10*3/uL (ref 150–440)
RBC: 4.39 10*6/uL (ref 3.80–5.20)
RDW: 15.1 % — ABNORMAL HIGH (ref 11.5–14.5)
WBC: 9.5 10*3/uL (ref 3.6–11.0)

## 2012-08-13 LAB — WBCS, STOOL

## 2012-08-15 LAB — STOOL CULTURE

## 2012-09-29 DIAGNOSIS — I10 Essential (primary) hypertension: Secondary | ICD-10-CM | POA: Diagnosis not present

## 2012-09-29 DIAGNOSIS — R0609 Other forms of dyspnea: Secondary | ICD-10-CM | POA: Diagnosis not present

## 2012-09-29 DIAGNOSIS — R0989 Other specified symptoms and signs involving the circulatory and respiratory systems: Secondary | ICD-10-CM | POA: Diagnosis not present

## 2012-09-29 DIAGNOSIS — J45901 Unspecified asthma with (acute) exacerbation: Secondary | ICD-10-CM | POA: Diagnosis not present

## 2012-09-30 DIAGNOSIS — F411 Generalized anxiety disorder: Secondary | ICD-10-CM | POA: Diagnosis not present

## 2012-09-30 DIAGNOSIS — R062 Wheezing: Secondary | ICD-10-CM | POA: Diagnosis not present

## 2012-09-30 DIAGNOSIS — J9600 Acute respiratory failure, unspecified whether with hypoxia or hypercapnia: Secondary | ICD-10-CM | POA: Diagnosis not present

## 2012-09-30 DIAGNOSIS — I059 Rheumatic mitral valve disease, unspecified: Secondary | ICD-10-CM | POA: Diagnosis not present

## 2012-09-30 DIAGNOSIS — R059 Cough, unspecified: Secondary | ICD-10-CM | POA: Diagnosis not present

## 2012-09-30 DIAGNOSIS — I447 Left bundle-branch block, unspecified: Secondary | ICD-10-CM | POA: Diagnosis not present

## 2012-09-30 DIAGNOSIS — Z9109 Other allergy status, other than to drugs and biological substances: Secondary | ICD-10-CM | POA: Diagnosis not present

## 2012-09-30 DIAGNOSIS — Z91010 Allergy to peanuts: Secondary | ICD-10-CM | POA: Diagnosis not present

## 2012-09-30 DIAGNOSIS — J209 Acute bronchitis, unspecified: Secondary | ICD-10-CM | POA: Diagnosis not present

## 2012-09-30 DIAGNOSIS — I10 Essential (primary) hypertension: Secondary | ICD-10-CM | POA: Diagnosis not present

## 2012-09-30 DIAGNOSIS — J449 Chronic obstructive pulmonary disease, unspecified: Secondary | ICD-10-CM | POA: Diagnosis not present

## 2012-09-30 DIAGNOSIS — R05 Cough: Secondary | ICD-10-CM | POA: Diagnosis not present

## 2012-09-30 DIAGNOSIS — I079 Rheumatic tricuspid valve disease, unspecified: Secondary | ICD-10-CM | POA: Diagnosis not present

## 2012-09-30 DIAGNOSIS — J45901 Unspecified asthma with (acute) exacerbation: Secondary | ICD-10-CM | POA: Diagnosis not present

## 2012-09-30 DIAGNOSIS — R0602 Shortness of breath: Secondary | ICD-10-CM | POA: Diagnosis not present

## 2012-09-30 DIAGNOSIS — I270 Primary pulmonary hypertension: Secondary | ICD-10-CM | POA: Diagnosis not present

## 2012-09-30 DIAGNOSIS — I2789 Other specified pulmonary heart diseases: Secondary | ICD-10-CM | POA: Diagnosis not present

## 2012-09-30 DIAGNOSIS — R011 Cardiac murmur, unspecified: Secondary | ICD-10-CM | POA: Diagnosis not present

## 2012-10-11 DIAGNOSIS — J330 Polyp of nasal cavity: Secondary | ICD-10-CM | POA: Diagnosis not present

## 2012-10-11 DIAGNOSIS — J45909 Unspecified asthma, uncomplicated: Secondary | ICD-10-CM | POA: Diagnosis not present

## 2012-11-10 ENCOUNTER — Ambulatory Visit: Payer: Self-pay

## 2012-11-10 DIAGNOSIS — I10 Essential (primary) hypertension: Secondary | ICD-10-CM | POA: Diagnosis not present

## 2012-11-10 DIAGNOSIS — J45909 Unspecified asthma, uncomplicated: Secondary | ICD-10-CM | POA: Diagnosis not present

## 2012-11-10 DIAGNOSIS — Z9849 Cataract extraction status, unspecified eye: Secondary | ICD-10-CM | POA: Diagnosis not present

## 2012-11-18 DIAGNOSIS — J45909 Unspecified asthma, uncomplicated: Secondary | ICD-10-CM | POA: Diagnosis not present

## 2012-11-18 DIAGNOSIS — R0609 Other forms of dyspnea: Secondary | ICD-10-CM | POA: Diagnosis not present

## 2012-12-02 DIAGNOSIS — J449 Chronic obstructive pulmonary disease, unspecified: Secondary | ICD-10-CM | POA: Diagnosis not present

## 2012-12-02 DIAGNOSIS — J45909 Unspecified asthma, uncomplicated: Secondary | ICD-10-CM | POA: Diagnosis not present

## 2012-12-02 DIAGNOSIS — J310 Chronic rhinitis: Secondary | ICD-10-CM | POA: Diagnosis not present

## 2012-12-02 IMAGING — CR RIGHT ANKLE - COMPLETE 3+ VIEW
1 series · 5 of 5 positions shown · non-contrast
Comparison: none

REASON FOR EXAM: gross deformity swelling sp fall
COMMENTS:

PROCEDURE:     DXR - DXR ANKLE RIGHT COMPLETE  - June 26, 2011  [DATE]
RESULT:     Five views of the right ankle are submitted. The patient has
sustained a complex comminuted trimalleolar fracture-dislocation. The
calcaneus and talus are grossly intact.

[Series 1: x ankle ap left · 0.14mm/px · 5 of 5 slices shown]
[im 1/5]
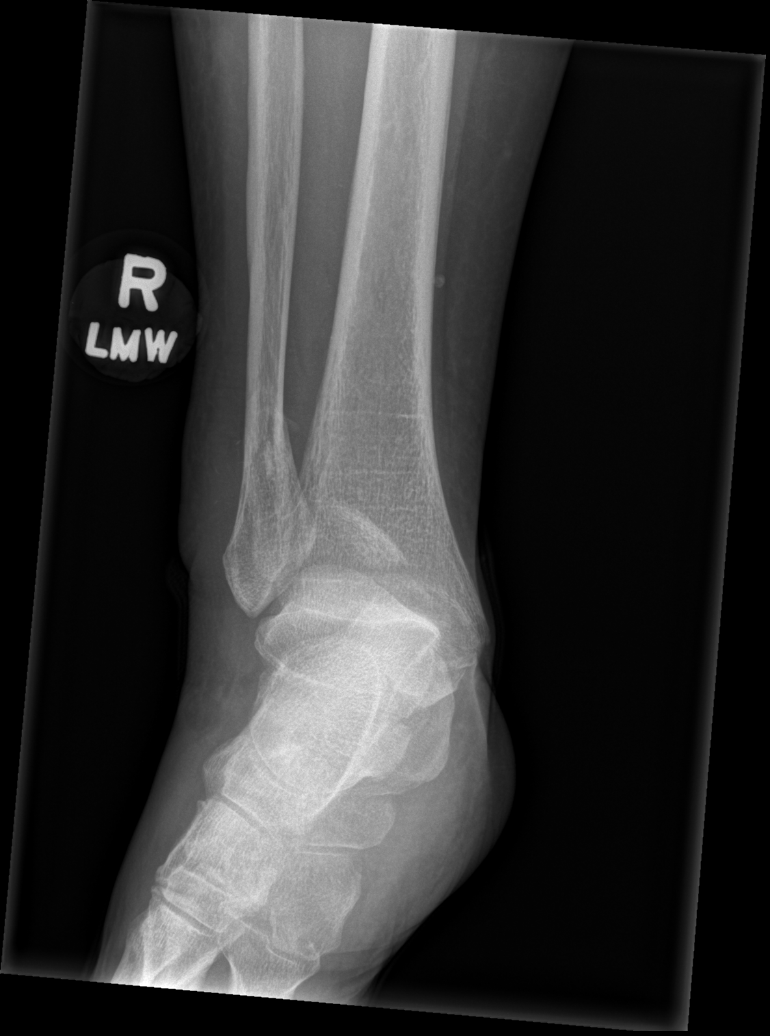
[im 2/5]
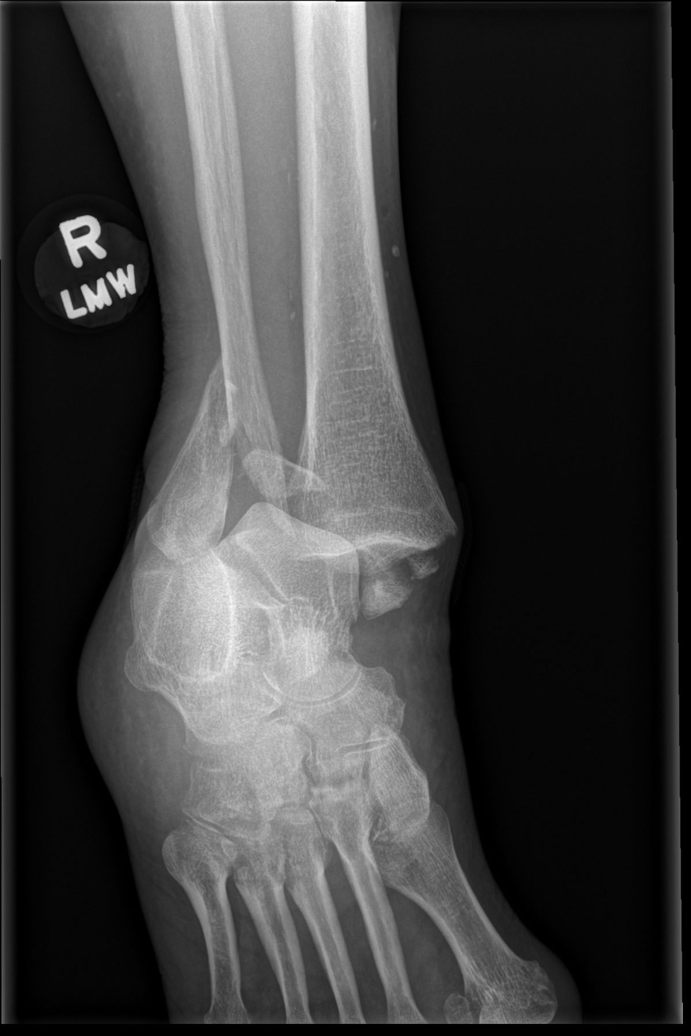
[im 3/5]
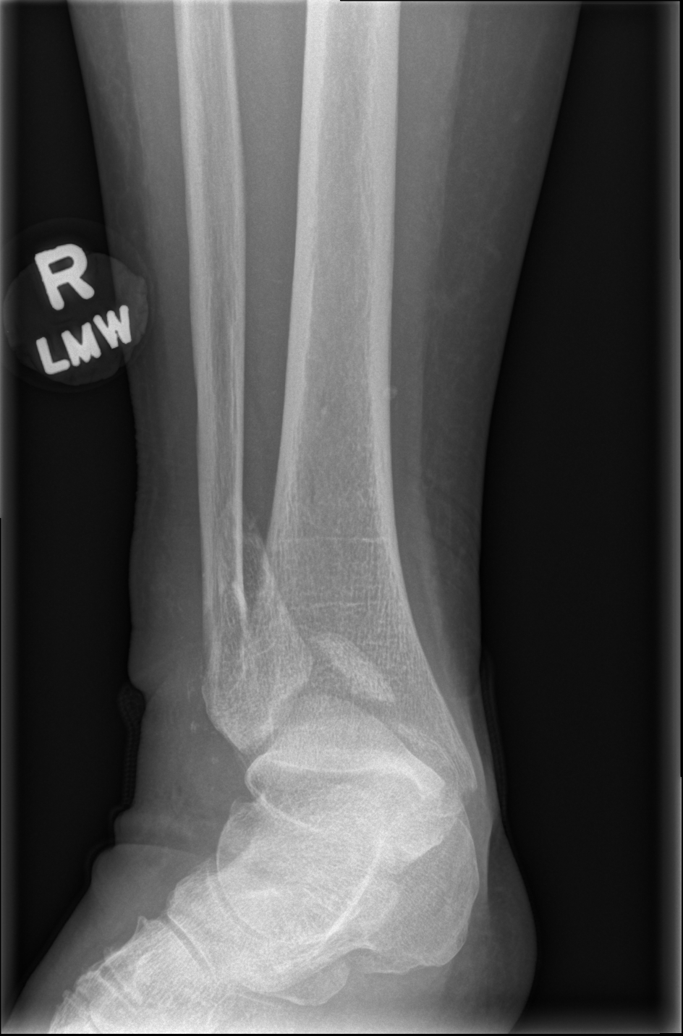
[im 4/5]
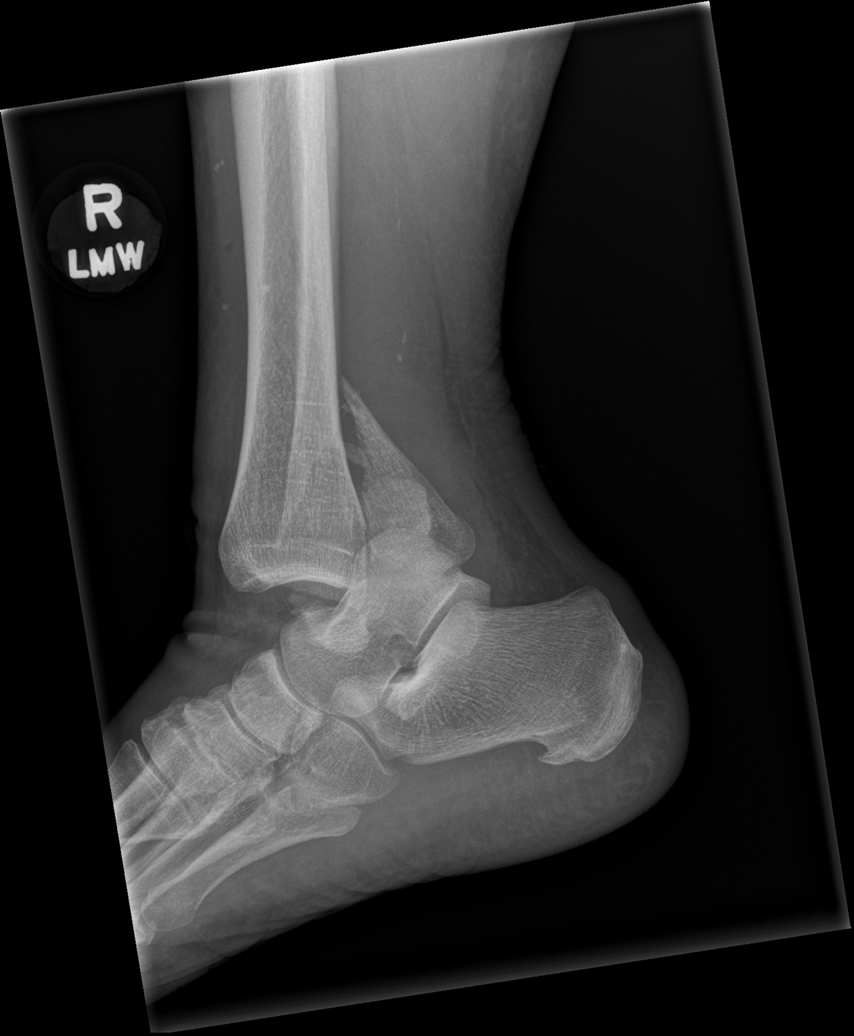
[im 5/5]
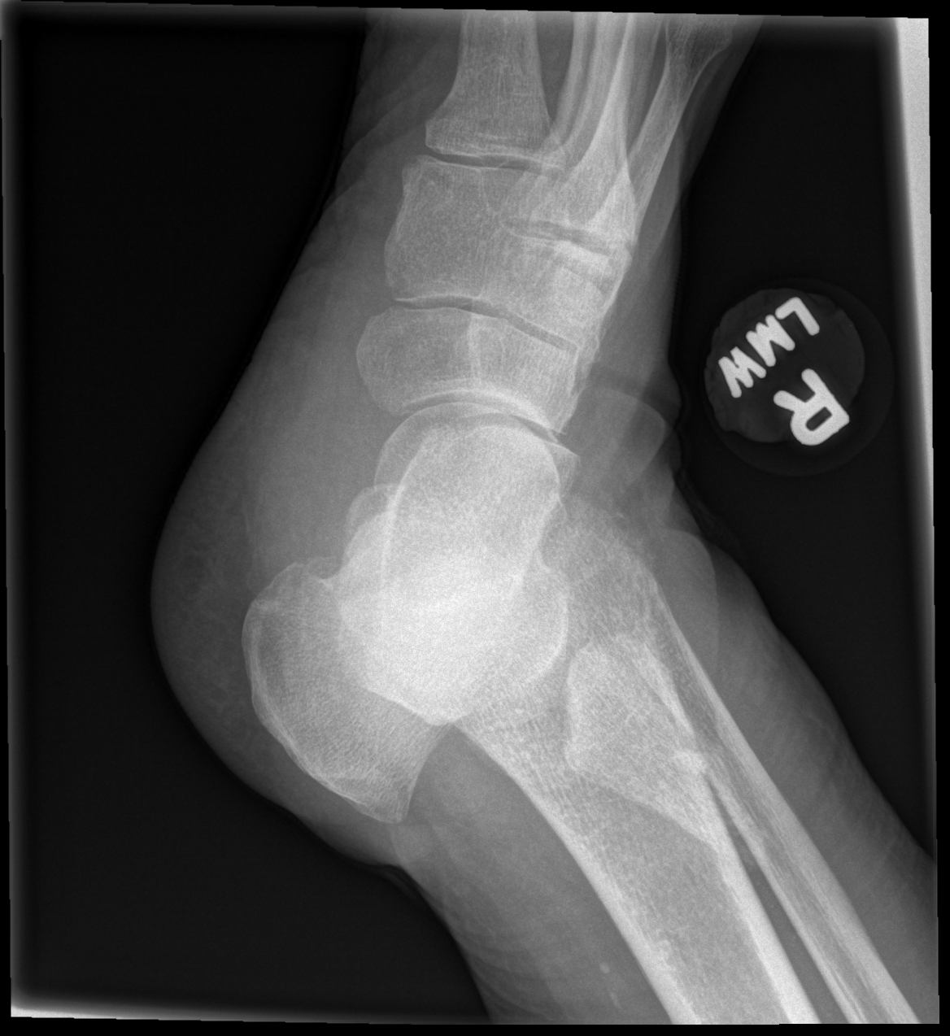

[5 of 5 positions shown; findings below may reference images not displayed]

IMPRESSION: The patient sustained a comminuted trimalleolar
fracture-dislocation of the right ankle.

## 2012-12-03 IMAGING — CR RIGHT TIBIA AND FIBULA - 2 VIEW
1 series · 2 of 2 positions shown · non-contrast
Comparison: none

REASON FOR EXAM: fx ankle
COMMENTS:

RESULT:     Comparison: None.

[Series 6: x tib-fib lat right · 0.14mm/px · 2 of 2 slices shown]
[im 1/2]
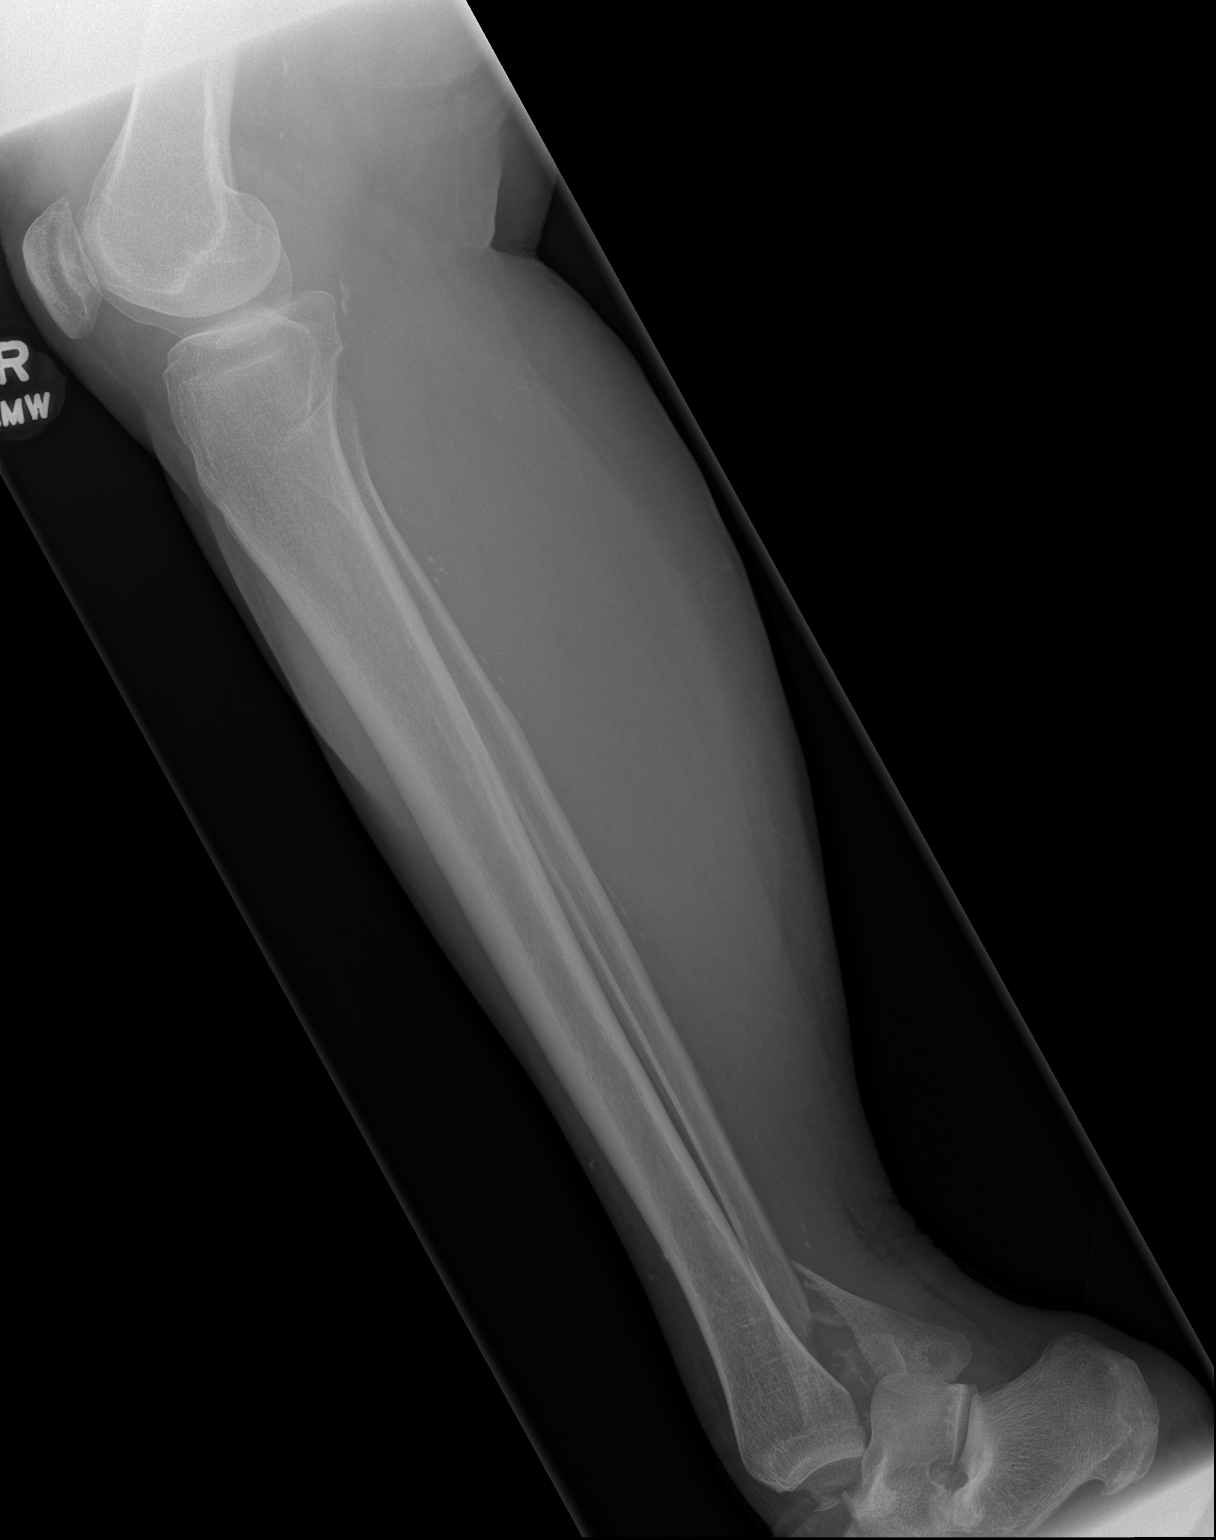
[im 2/2]
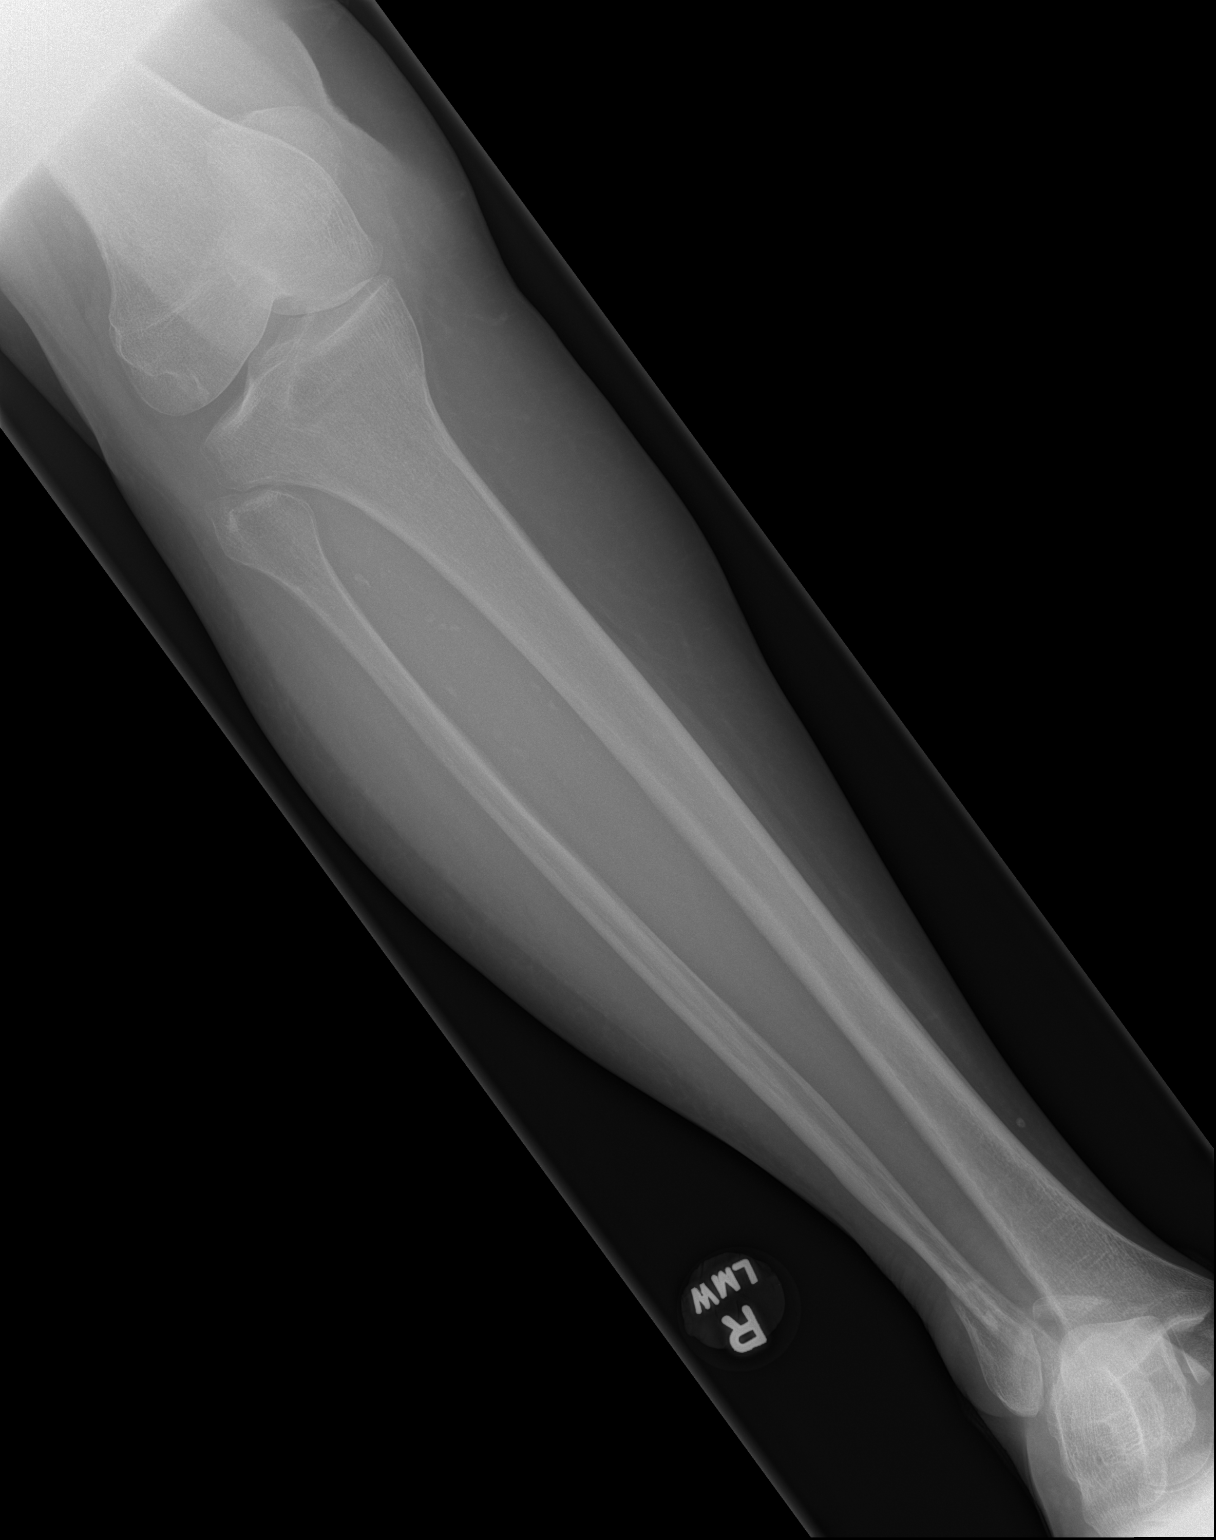

[2 of 2 positions shown; findings below may reference images not displayed]

FINDINGS: There is a complex fracture of the distal tibia and fibula, better
visualized on the dedicated ankle radiographs. Vascular calcifications are
present.
IMPRESSION: Please see above.

## 2012-12-03 IMAGING — CR RIGHT ANKLE - 2 VIEW
1 series · 3 of 3 positions shown · non-contrast
Comparison: none

REASON FOR EXAM: post op
COMMENTS:   LMP: Post-Menopausal

[Series 1: ap · 0.17mm/px · 3 of 3 slices shown]
[im 1/3]
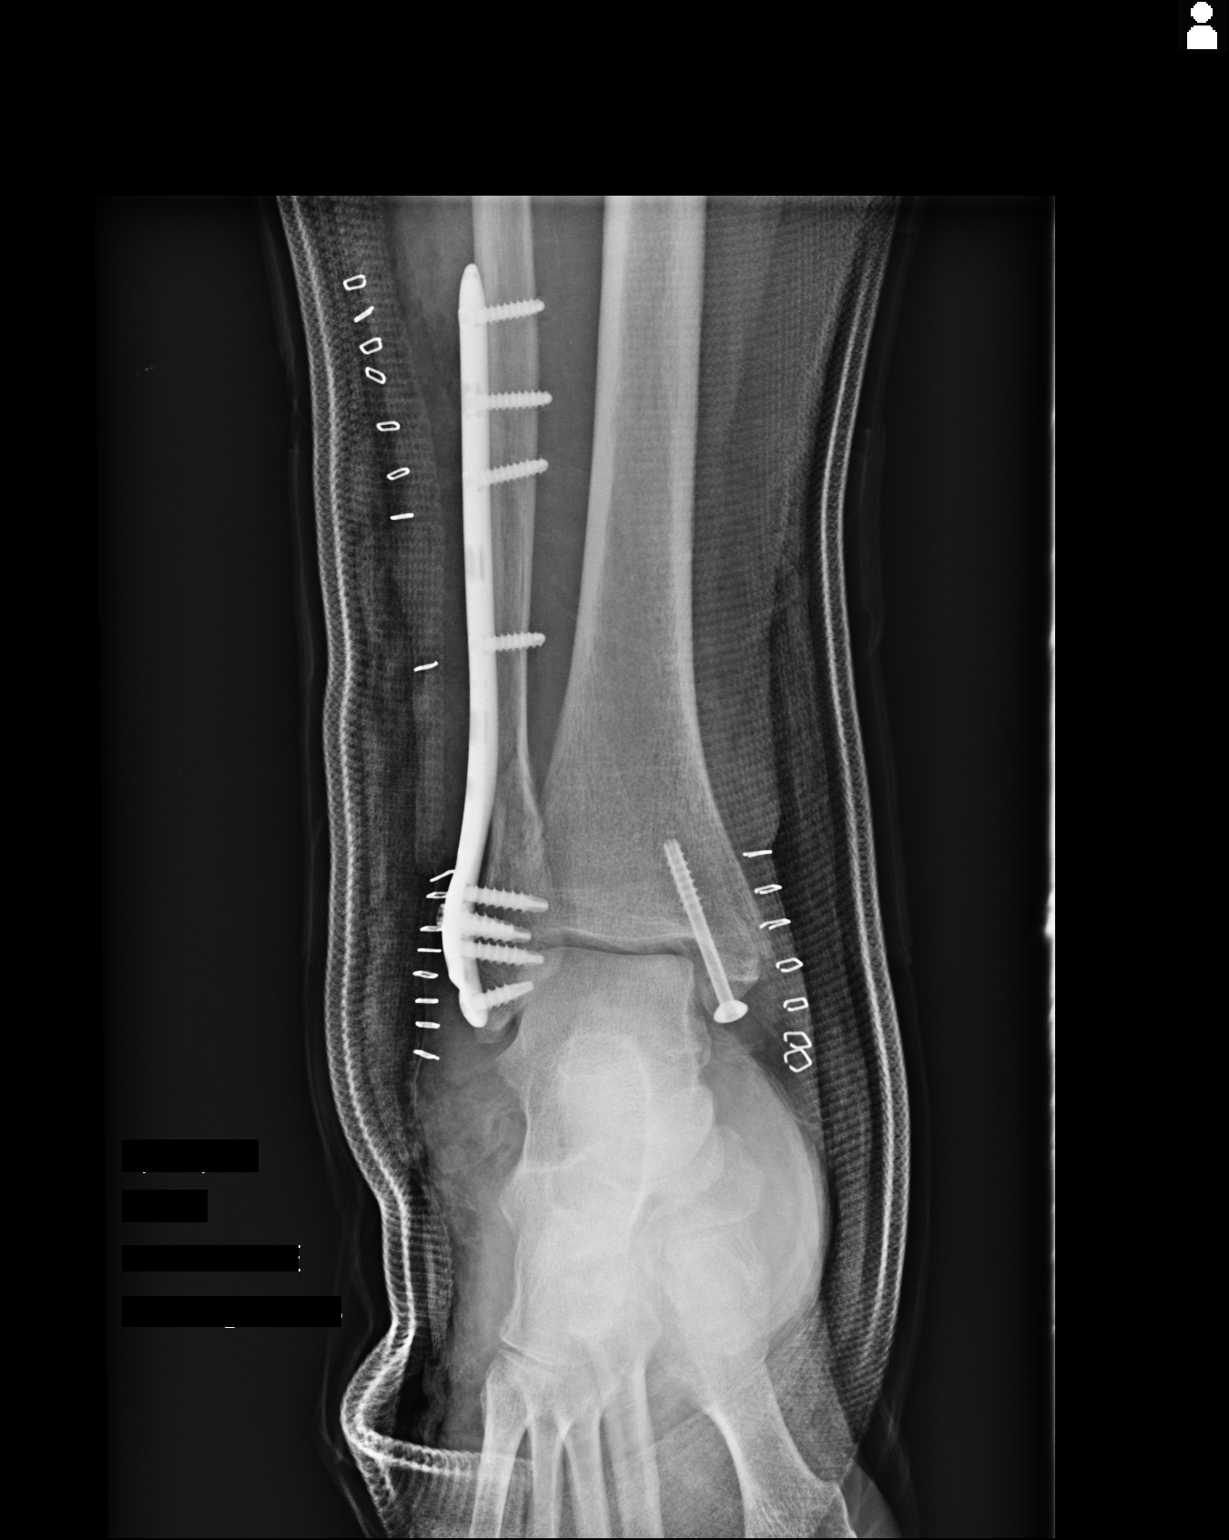
[im 2/3]
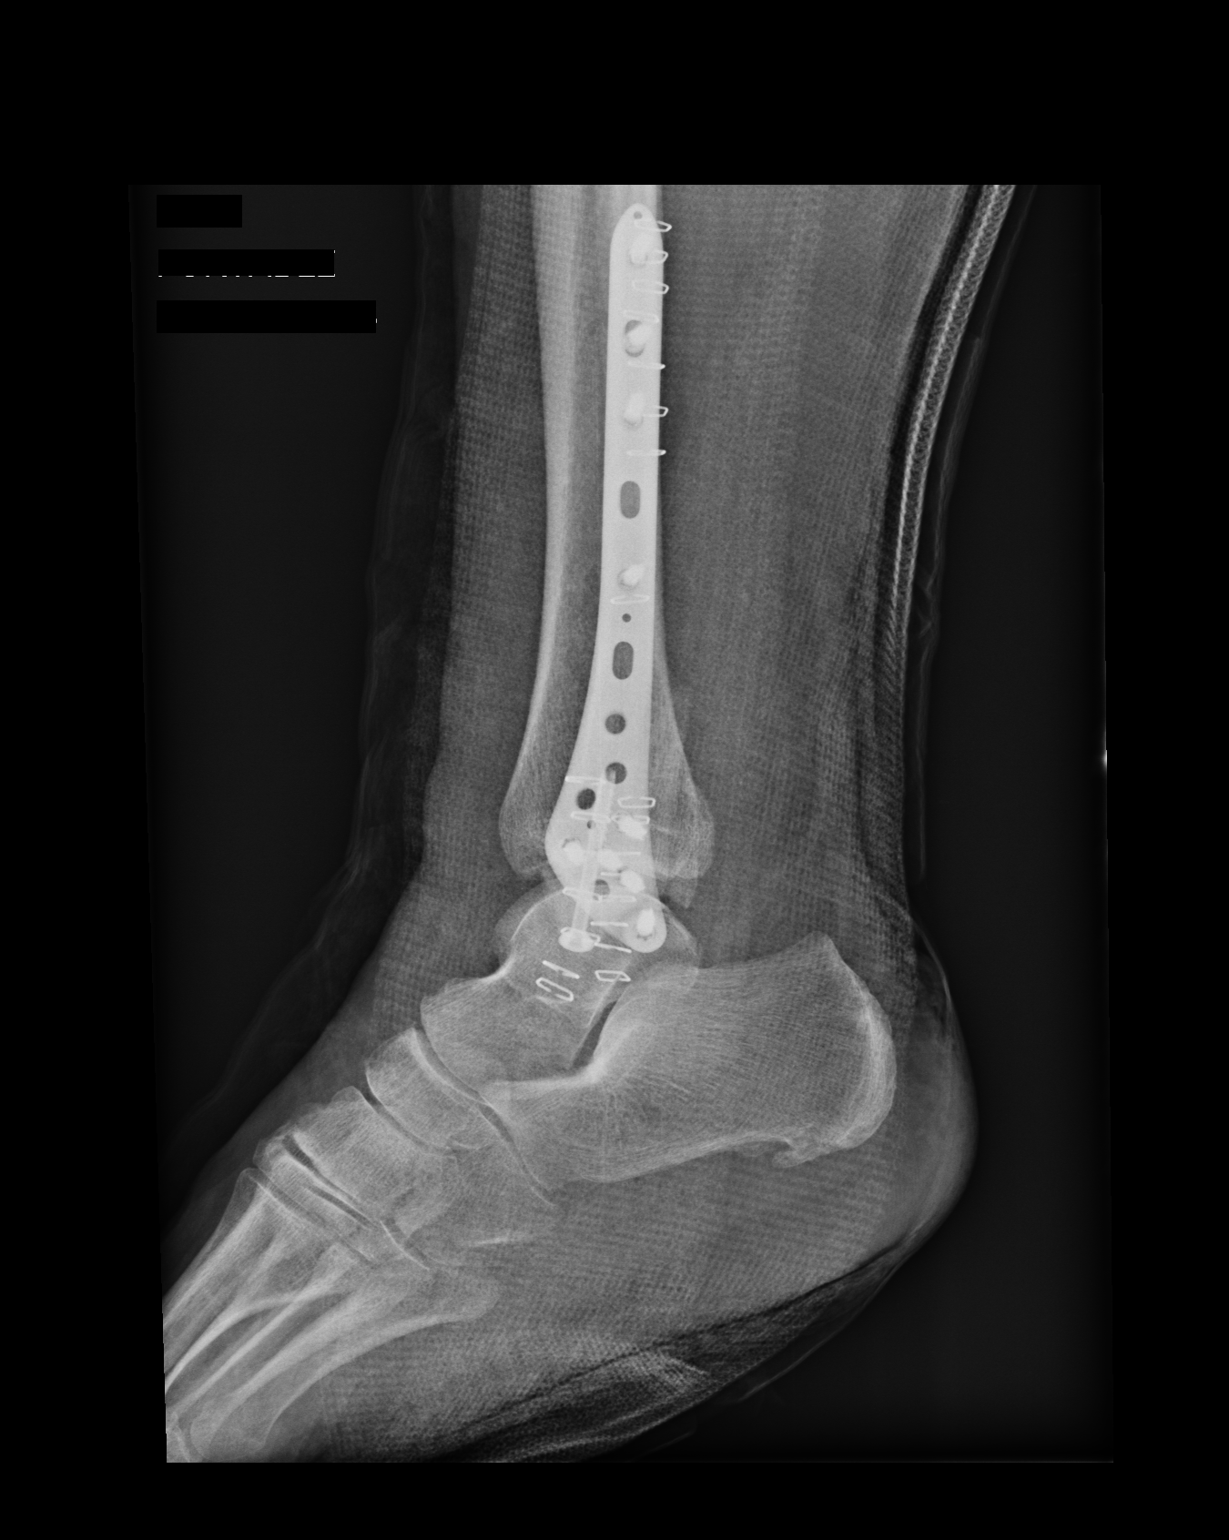
[im 3/3]
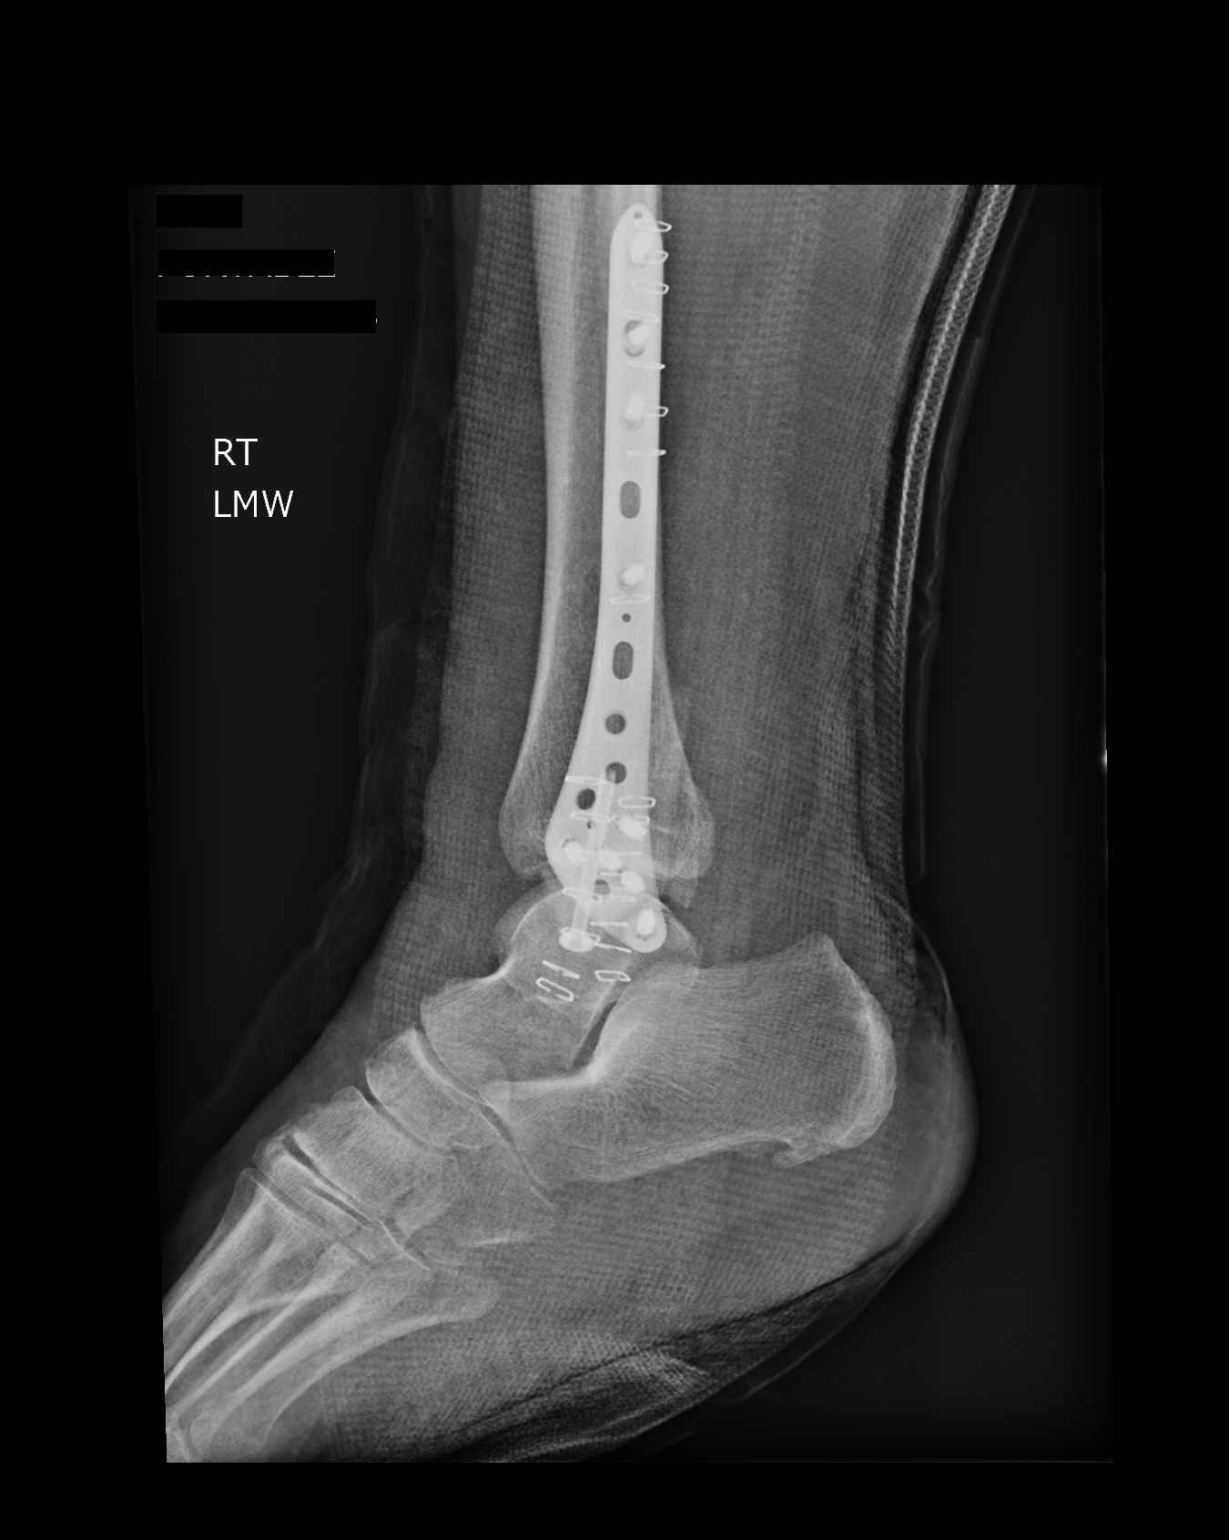

[3 of 3 positions shown; findings below may reference images not displayed]

PROCEDURE:     DXR - DXR ANKLE RIGHT AP AND LATERAL  - June 26, 2011  [DATE]

RESULT:     The patient has undergone ORIF of the fractures of the medial
and lateral malleoli with multiple screws and a lateral fibular plate. Skin
staples are present. Posterior malleolar fracture is present and appears
reduced. Anatomic alignment appears to be present.
IMPRESSION: Post operative changes with reduction and ORIF of the right
ankle fractures.

[REDACTED]

## 2012-12-03 IMAGING — CR RIGHT ANKLE - 2 VIEW
1 series · 2 of 2 positions shown · non-contrast
Comparison: none

REASON FOR EXAM: post reduction
COMMENTS:

[Series 1: ap · 0.17mm/px · 2 of 2 slices shown]
[im 1/2]
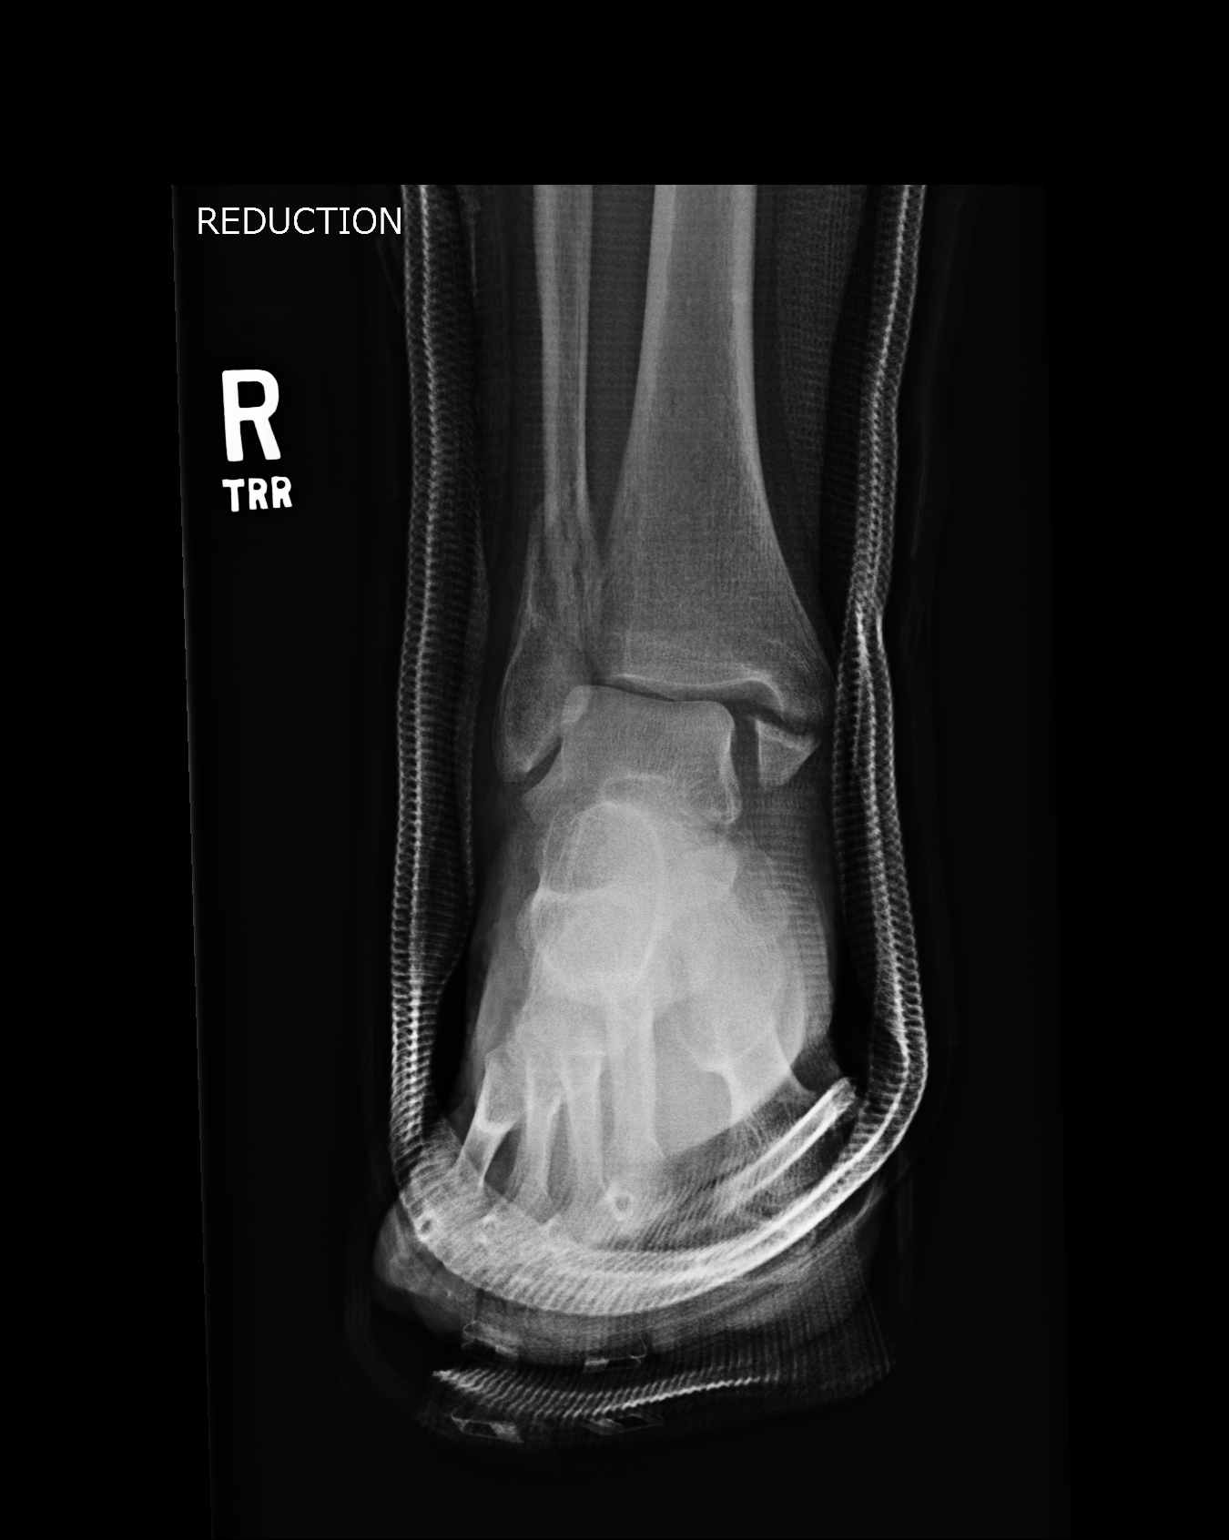
[im 2/2]
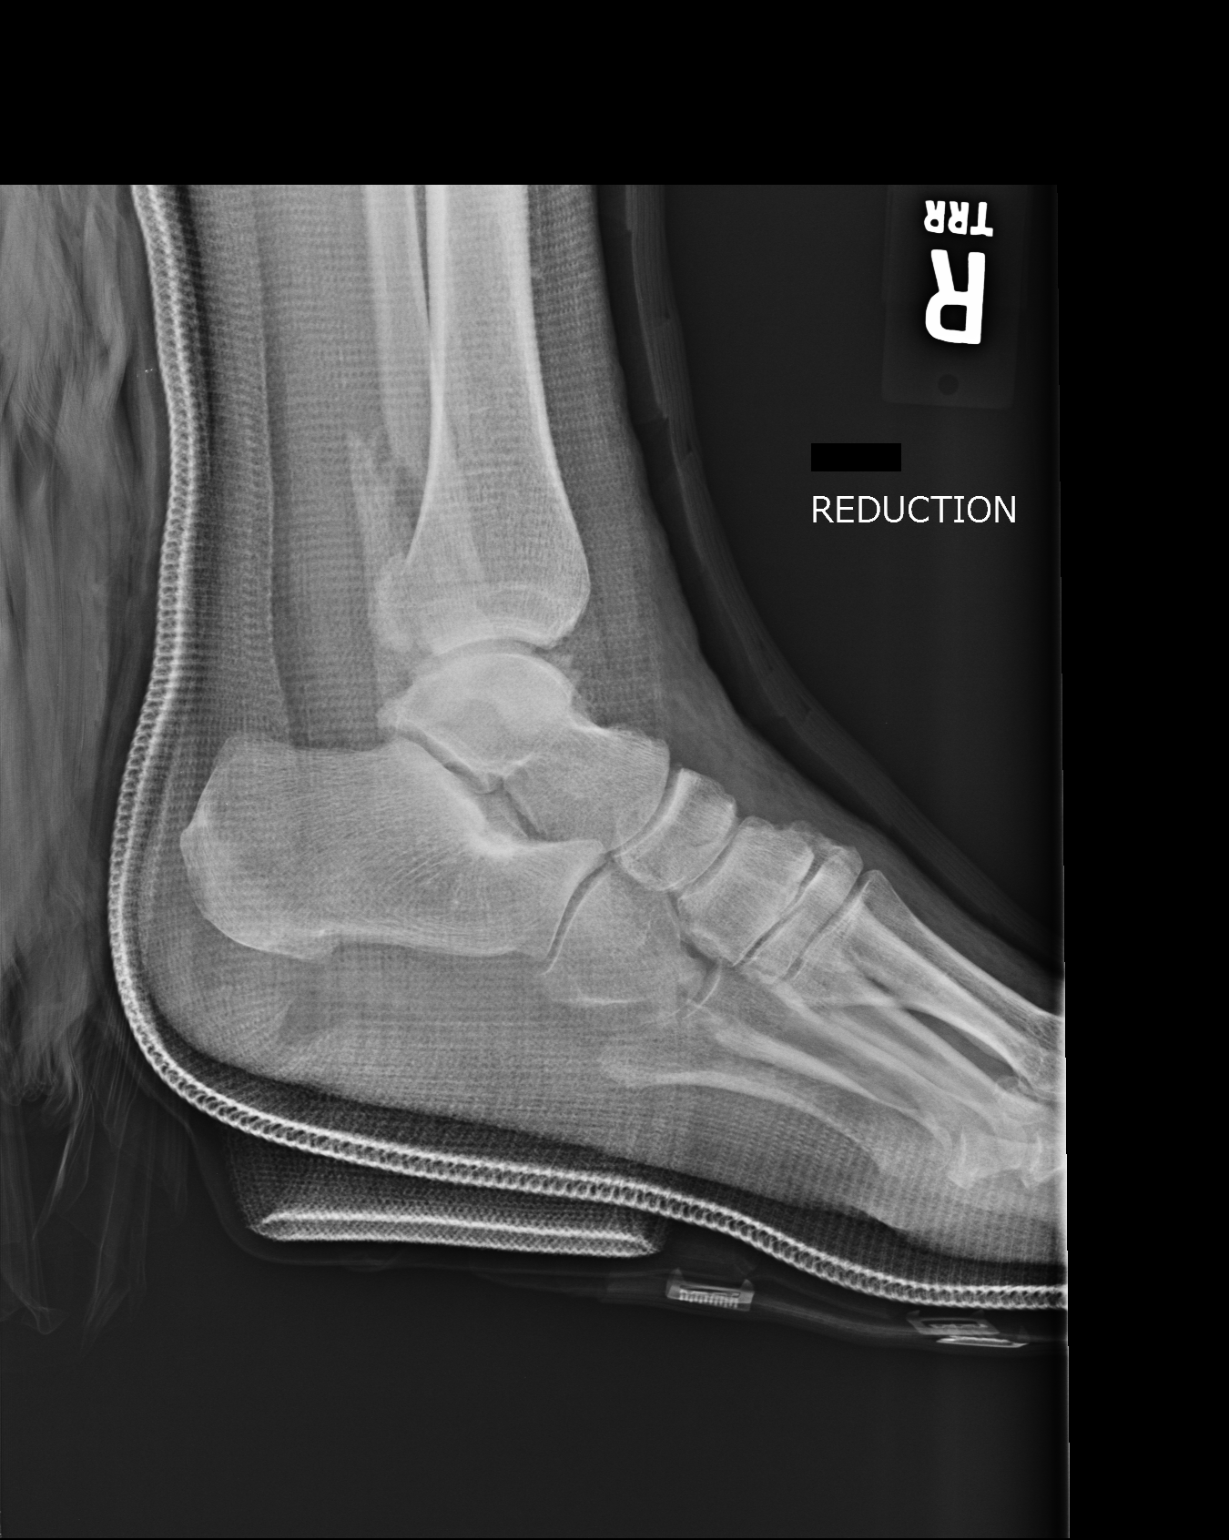

[2 of 2 positions shown; findings below may reference images not displayed]

PROCEDURE:     DXR - DXR ANKLE RIGHT AP AND LATERAL  - June 26, 2011  [DATE]

RESULT:     AP and lateral in plaster views reveal the patient to have
sustained a previous trimalleolar fracture-dislocation. The ankle joint
mortise remains disrupted. There is mild displacement of the distal fibular
fracture as well as of the medial malleolar fracture. The posterior
malleolar fracture is mildly displaced as well.
IMPRESSION: The patient has sustained a trimalleolar
fracture-dislocation and has undergone closed reduction. Further
interpretation is deferred to Dr. Indi.

[REDACTED]

## 2012-12-03 IMAGING — CR DG CHEST 1V
1 series · 1 of 1 positions shown · non-contrast
Comparison: none

REASON FOR EXAM: fall
COMMENTS:

PROCEDURE:     DXR - DXR CHEST 1 VIEWAP OR PA  - June 26, 2011 [DATE]
RESULT:     Comparison: None.

[t chest supine]
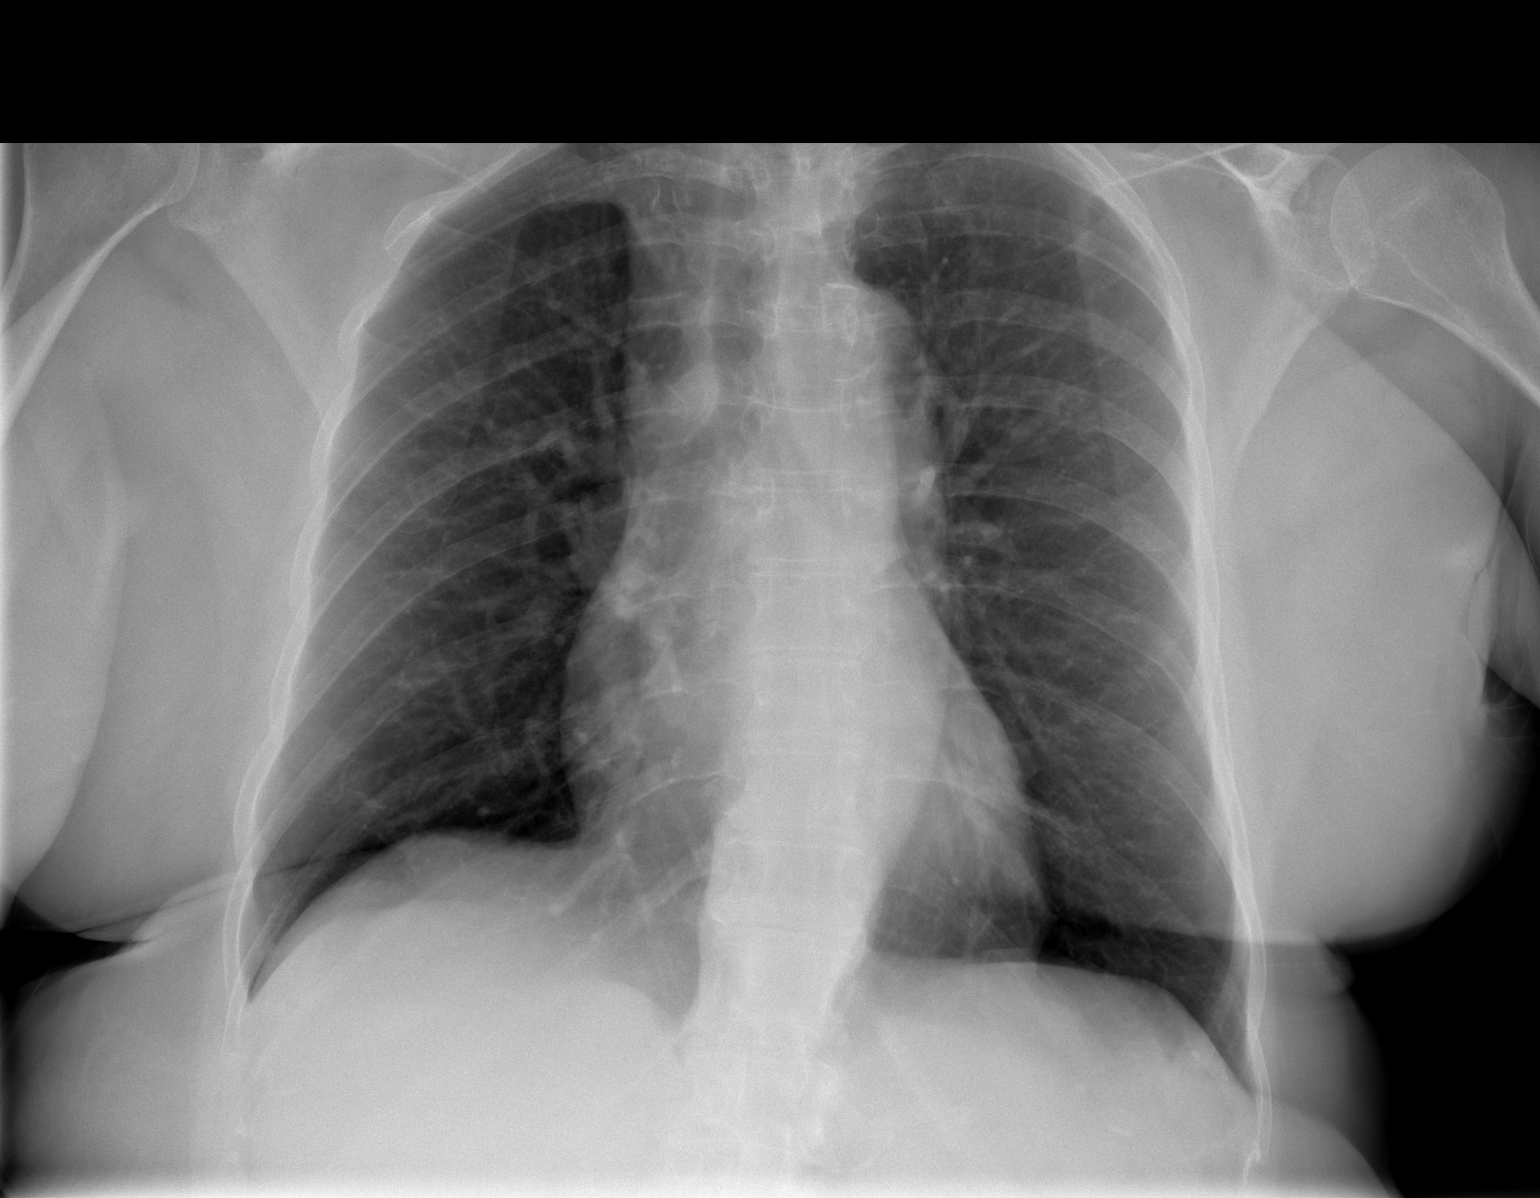

[1 of 1 positions shown; findings below may reference images not displayed]

FINDINGS: The heart and mediastinum are normal limits, given patient rotation to the
right. Mild prominence of the right superior mediastinum is likely secondary
to patient rotation. No focal pulmonary opacities.
IMPRESSION: 1. No acute cardio pulmonary disease.
2. Mild prominence of the right superior mediastinum is likely secondary to
patient rotation. However, followup PA and lateral chest radiographs are
recommended.

## 2012-12-19 DIAGNOSIS — J449 Chronic obstructive pulmonary disease, unspecified: Secondary | ICD-10-CM | POA: Diagnosis not present

## 2012-12-19 DIAGNOSIS — J310 Chronic rhinitis: Secondary | ICD-10-CM | POA: Diagnosis not present

## 2012-12-19 DIAGNOSIS — J45909 Unspecified asthma, uncomplicated: Secondary | ICD-10-CM | POA: Diagnosis not present

## 2013-01-19 DIAGNOSIS — R05 Cough: Secondary | ICD-10-CM | POA: Diagnosis not present

## 2013-01-19 DIAGNOSIS — R059 Cough, unspecified: Secondary | ICD-10-CM | POA: Diagnosis not present

## 2013-01-19 DIAGNOSIS — J310 Chronic rhinitis: Secondary | ICD-10-CM | POA: Diagnosis not present

## 2013-01-19 DIAGNOSIS — J45909 Unspecified asthma, uncomplicated: Secondary | ICD-10-CM | POA: Diagnosis not present

## 2013-02-28 DIAGNOSIS — J45909 Unspecified asthma, uncomplicated: Secondary | ICD-10-CM | POA: Diagnosis not present

## 2013-02-28 DIAGNOSIS — J330 Polyp of nasal cavity: Secondary | ICD-10-CM | POA: Diagnosis not present

## 2013-03-23 DIAGNOSIS — J45909 Unspecified asthma, uncomplicated: Secondary | ICD-10-CM | POA: Diagnosis not present

## 2013-03-23 DIAGNOSIS — H6120 Impacted cerumen, unspecified ear: Secondary | ICD-10-CM | POA: Diagnosis not present

## 2013-03-23 DIAGNOSIS — J330 Polyp of nasal cavity: Secondary | ICD-10-CM | POA: Diagnosis not present

## 2013-08-07 DIAGNOSIS — J018 Other acute sinusitis: Secondary | ICD-10-CM | POA: Diagnosis not present

## 2013-09-25 ENCOUNTER — Ambulatory Visit: Payer: Self-pay | Admitting: Family Medicine

## 2013-09-25 DIAGNOSIS — E876 Hypokalemia: Secondary | ICD-10-CM | POA: Diagnosis not present

## 2013-09-25 DIAGNOSIS — J45901 Unspecified asthma with (acute) exacerbation: Secondary | ICD-10-CM | POA: Diagnosis not present

## 2013-09-25 DIAGNOSIS — R0902 Hypoxemia: Secondary | ICD-10-CM | POA: Diagnosis not present

## 2013-09-25 DIAGNOSIS — R197 Diarrhea, unspecified: Secondary | ICD-10-CM | POA: Diagnosis not present

## 2013-09-25 DIAGNOSIS — K219 Gastro-esophageal reflux disease without esophagitis: Secondary | ICD-10-CM | POA: Diagnosis not present

## 2013-09-25 DIAGNOSIS — I700 Atherosclerosis of aorta: Secondary | ICD-10-CM | POA: Diagnosis not present

## 2013-09-25 LAB — BASIC METABOLIC PANEL
Anion Gap: 11 (ref 7–16)
BUN: 23 mg/dL — ABNORMAL HIGH (ref 7–18)
Calcium, Total: 9.6 mg/dL (ref 8.5–10.1)
Chloride: 100 mmol/L (ref 98–107)
Co2: 26 mmol/L (ref 21–32)
Creatinine: 1.02 mg/dL (ref 0.60–1.30)
EGFR (African American): >60
EGFR (Non-African Amer.): 54 — ABNORMAL LOW
Glucose: 138 mg/dL — ABNORMAL HIGH (ref 65–99)
Osmolality: 280 (ref 275–301)
Potassium: 3.2 mmol/L — ABNORMAL LOW (ref 3.5–5.1)
Sodium: 137 mmol/L (ref 136–145)

## 2013-09-25 LAB — CBC WITH DIFFERENTIAL/PLATELET
Basophil #: 0.1 10*3/uL (ref 0.0–0.1)
Basophil %: 0.5 %
Eosinophil #: 2.1 10*3/uL — ABNORMAL HIGH (ref 0.0–0.7)
Eosinophil %: 20.7 %
HCT: 37.9 % (ref 35.0–47.0)
HGB: 12.3 g/dL (ref 12.0–16.0)
Lymphocyte #: 1.4 10*3/uL (ref 1.0–3.6)
Lymphocyte %: 13.3 %
MCH: 28.1 pg (ref 26.0–34.0)
MCHC: 32.3 g/dL (ref 32.0–36.0)
MCV: 87 fL (ref 80–100)
Monocyte #: 0.7 x10 3/mm (ref 0.2–0.9)
Monocyte %: 7.3 %
Neutrophil #: 6 10*3/uL (ref 1.4–6.5)
Neutrophil %: 58.2 %
Platelet: 302 10*3/uL (ref 150–440)
RBC: 4.36 10*6/uL (ref 3.80–5.20)
RDW: 15.5 % — ABNORMAL HIGH (ref 11.5–14.5)
WBC: 10.2 10*3/uL (ref 3.6–11.0)

## 2013-10-12 DIAGNOSIS — H40009 Preglaucoma, unspecified, unspecified eye: Secondary | ICD-10-CM | POA: Diagnosis not present

## 2013-11-09 DIAGNOSIS — J42 Unspecified chronic bronchitis: Secondary | ICD-10-CM | POA: Diagnosis not present

## 2013-11-09 DIAGNOSIS — J329 Chronic sinusitis, unspecified: Secondary | ICD-10-CM | POA: Diagnosis not present

## 2013-11-09 DIAGNOSIS — J339 Nasal polyp, unspecified: Secondary | ICD-10-CM | POA: Diagnosis not present

## 2013-11-09 HISTORY — PX: OTHER SURGICAL HISTORY: SHX169

## 2013-11-29 DIAGNOSIS — J019 Acute sinusitis, unspecified: Secondary | ICD-10-CM | POA: Diagnosis not present

## 2013-11-29 DIAGNOSIS — J410 Simple chronic bronchitis: Secondary | ICD-10-CM | POA: Diagnosis not present

## 2013-11-29 DIAGNOSIS — J330 Polyp of nasal cavity: Secondary | ICD-10-CM | POA: Diagnosis not present

## 2013-11-29 DIAGNOSIS — J324 Chronic pansinusitis: Secondary | ICD-10-CM | POA: Diagnosis not present

## 2013-11-29 DIAGNOSIS — J339 Nasal polyp, unspecified: Secondary | ICD-10-CM | POA: Diagnosis not present

## 2013-12-25 ENCOUNTER — Ambulatory Visit: Payer: Self-pay | Admitting: Physician Assistant

## 2013-12-25 DIAGNOSIS — I10 Essential (primary) hypertension: Secondary | ICD-10-CM | POA: Diagnosis not present

## 2013-12-25 DIAGNOSIS — J45909 Unspecified asthma, uncomplicated: Secondary | ICD-10-CM | POA: Diagnosis not present

## 2014-01-04 DIAGNOSIS — Z79899 Other long term (current) drug therapy: Secondary | ICD-10-CM | POA: Diagnosis not present

## 2014-01-04 DIAGNOSIS — Z23 Encounter for immunization: Secondary | ICD-10-CM | POA: Diagnosis not present

## 2014-01-04 DIAGNOSIS — E559 Vitamin D deficiency, unspecified: Secondary | ICD-10-CM | POA: Diagnosis not present

## 2014-01-04 DIAGNOSIS — I10 Essential (primary) hypertension: Secondary | ICD-10-CM | POA: Diagnosis not present

## 2014-01-04 DIAGNOSIS — Z0000 Encounter for general adult medical examination without abnormal findings: Secondary | ICD-10-CM | POA: Diagnosis not present

## 2014-01-04 DIAGNOSIS — K219 Gastro-esophageal reflux disease without esophagitis: Secondary | ICD-10-CM | POA: Diagnosis not present

## 2014-01-04 DIAGNOSIS — Z8739 Personal history of other diseases of the musculoskeletal system and connective tissue: Secondary | ICD-10-CM | POA: Diagnosis not present

## 2014-01-04 DIAGNOSIS — J4550 Severe persistent asthma, uncomplicated: Secondary | ICD-10-CM | POA: Diagnosis not present

## 2014-01-17 IMAGING — CR DG CHEST 2V
1 series · 2 of 2 positions shown · non-contrast
Comparison: none

REASON FOR EXAM: SOB with exertion; low O2 sats
COMMENTS:   LMP: Post-Menopausal

[Series 1: pa · 0.17mm/px · 2 of 2 slices shown]
[im 1/2]
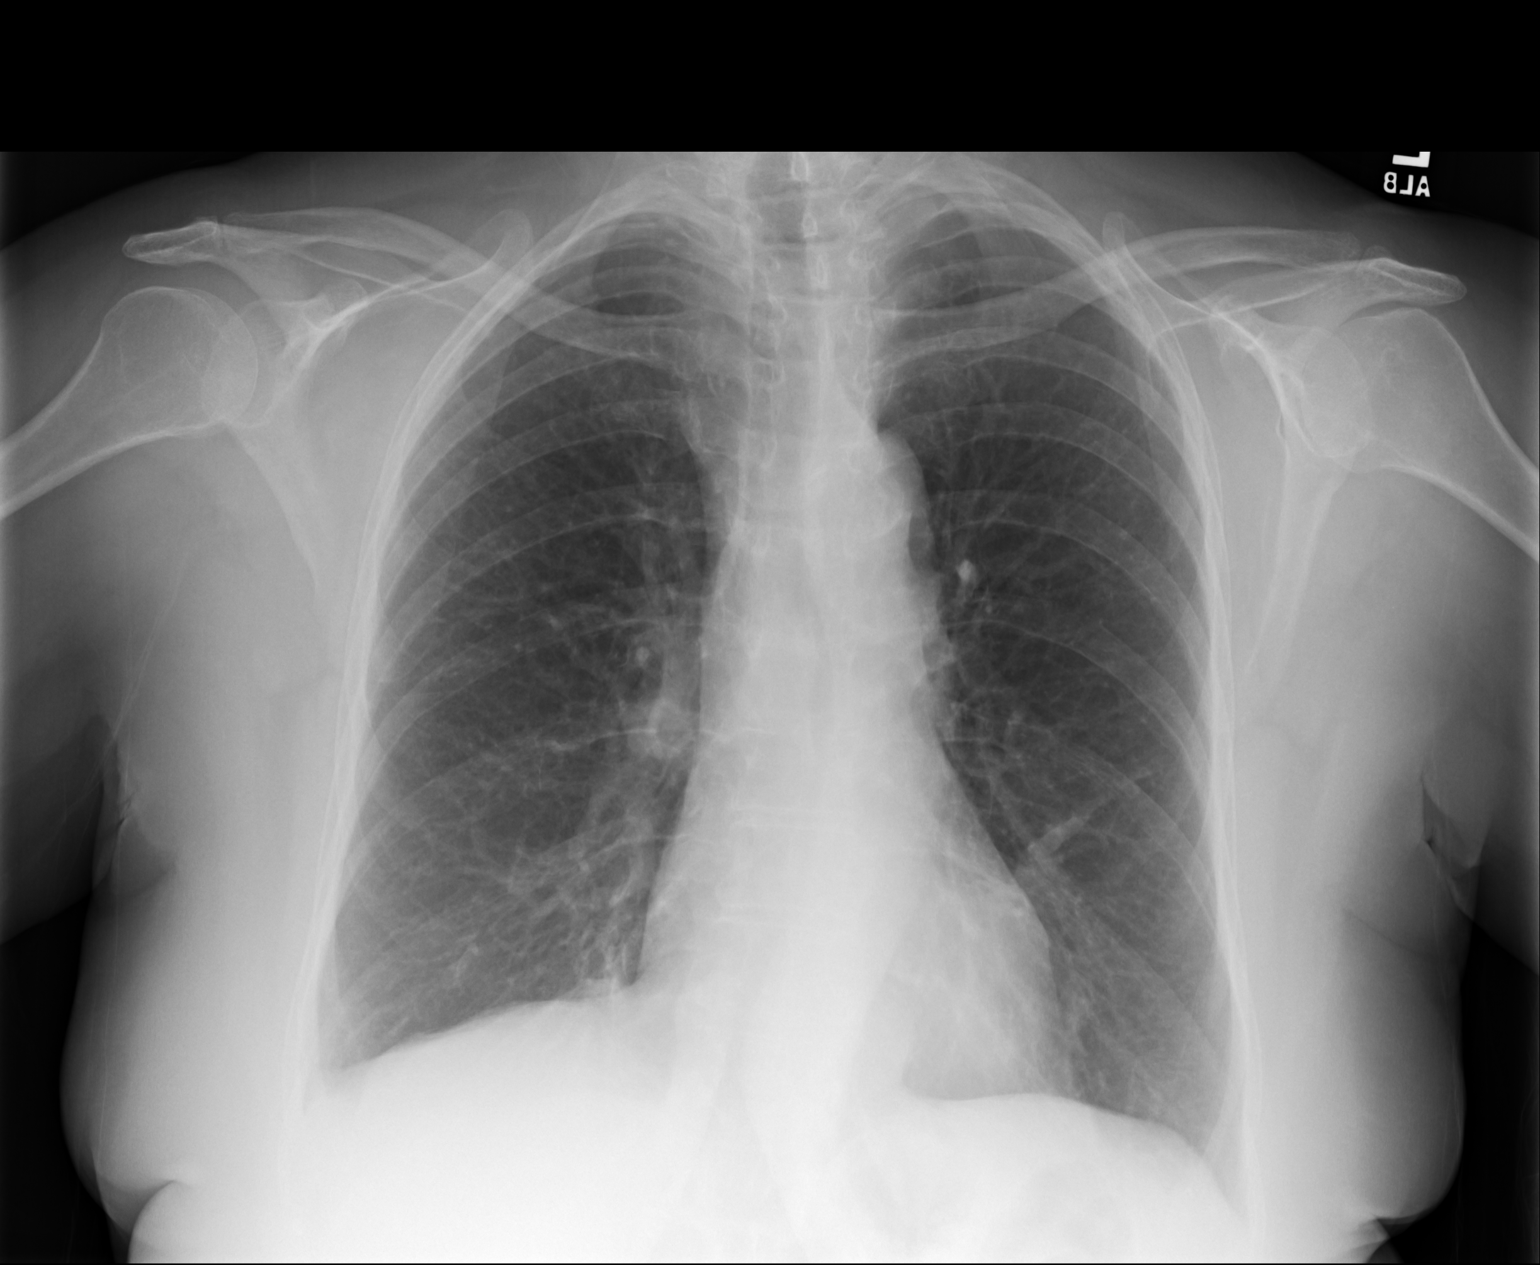
[im 2/2]
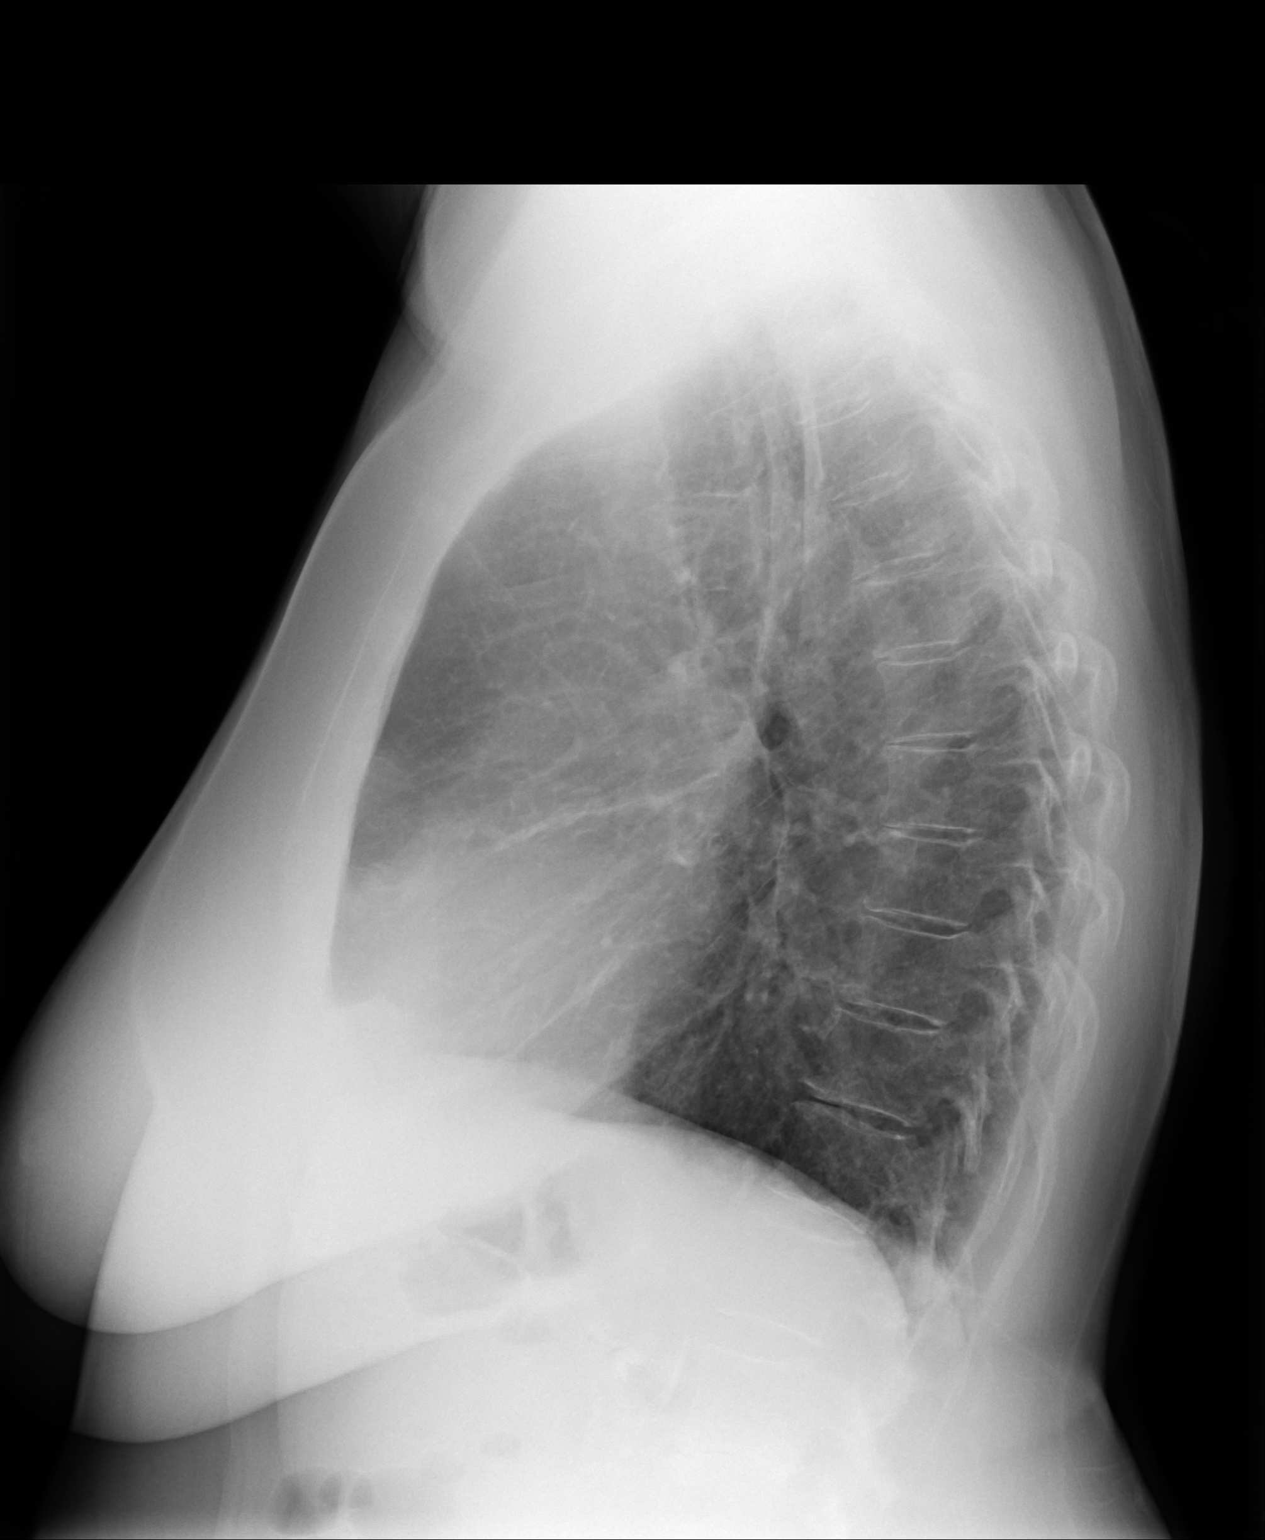

[2 of 2 positions shown; findings below may reference images not displayed]

PROCEDURE:     MDR - MDR CHEST PA(OR AP) AND LATERAL  - August 09, 2012  [DATE]

RESULT:     Comparison is made to the study of 06/26/2011.

There is mild hyperinflation. The lungs are clear. The heart and pulmonary
vessels are normal. The bony and mediastinal structures are unremarkable.
There is no effusion. There is no pneumothorax or evidence of congestive
failure.
IMPRESSION: No acute cardiopulmonary disease. Stable appearance.

[REDACTED]

## 2014-02-19 ENCOUNTER — Ambulatory Visit: Payer: Self-pay | Admitting: Physician Assistant

## 2014-02-19 DIAGNOSIS — E119 Type 2 diabetes mellitus without complications: Secondary | ICD-10-CM | POA: Diagnosis not present

## 2014-02-21 DIAGNOSIS — J4540 Moderate persistent asthma, uncomplicated: Secondary | ICD-10-CM | POA: Diagnosis not present

## 2014-02-21 DIAGNOSIS — J45991 Cough variant asthma: Secondary | ICD-10-CM | POA: Diagnosis not present

## 2014-02-22 DIAGNOSIS — E119 Type 2 diabetes mellitus without complications: Secondary | ICD-10-CM | POA: Diagnosis not present

## 2014-02-26 DIAGNOSIS — M67442 Ganglion, left hand: Secondary | ICD-10-CM | POA: Diagnosis not present

## 2014-02-27 DIAGNOSIS — Z780 Asymptomatic menopausal state: Secondary | ICD-10-CM | POA: Diagnosis not present

## 2014-02-27 DIAGNOSIS — Z7952 Long term (current) use of systemic steroids: Secondary | ICD-10-CM | POA: Diagnosis not present

## 2014-03-01 ENCOUNTER — Institutional Professional Consult (permissible substitution): Payer: PRIVATE HEALTH INSURANCE | Admitting: Internal Medicine

## 2014-03-01 DIAGNOSIS — E119 Type 2 diabetes mellitus without complications: Secondary | ICD-10-CM | POA: Diagnosis not present

## 2014-03-02 DIAGNOSIS — E782 Mixed hyperlipidemia: Secondary | ICD-10-CM | POA: Diagnosis not present

## 2014-03-02 DIAGNOSIS — I10 Essential (primary) hypertension: Secondary | ICD-10-CM | POA: Diagnosis not present

## 2014-03-02 DIAGNOSIS — R0602 Shortness of breath: Secondary | ICD-10-CM | POA: Diagnosis not present

## 2014-03-02 DIAGNOSIS — R9431 Abnormal electrocardiogram [ECG] [EKG]: Secondary | ICD-10-CM | POA: Diagnosis not present

## 2014-03-05 ENCOUNTER — Encounter: Payer: Self-pay | Admitting: Internal Medicine

## 2014-03-05 ENCOUNTER — Encounter: Payer: Self-pay | Admitting: *Deleted

## 2014-03-06 ENCOUNTER — Ambulatory Visit (INDEPENDENT_AMBULATORY_CARE_PROVIDER_SITE_OTHER): Payer: Medicare Other | Admitting: Internal Medicine

## 2014-03-06 ENCOUNTER — Encounter: Payer: Self-pay | Admitting: Internal Medicine

## 2014-03-06 VITALS — BP 126/66 | HR 71 | Temp 98.1°F | Ht 60.0 in | Wt 158.8 lb

## 2014-03-06 DIAGNOSIS — R05 Cough: Secondary | ICD-10-CM

## 2014-03-06 DIAGNOSIS — R059 Cough, unspecified: Secondary | ICD-10-CM | POA: Insufficient documentation

## 2014-03-06 DIAGNOSIS — J4540 Moderate persistent asthma, uncomplicated: Secondary | ICD-10-CM | POA: Diagnosis not present

## 2014-03-06 DIAGNOSIS — J45909 Unspecified asthma, uncomplicated: Secondary | ICD-10-CM | POA: Insufficient documentation

## 2014-03-06 NOTE — Assessment & Plan Note (Addendum)
Patient with known asthma, currently not optimized. Unknown etiology of her current asthma triggers, she has had the need for steroids at least 4 times in the past year.  Plan: -Further evaluate of obstruction with primary function testing, and obtain records from previous pulmonologist. -Continue control of allergic rhinitis/nasal congestion with as needed nasal steroid. -Frequent exacerbation of asthma is of concern for any underlying atypical residual infection from 2014 when she was hospitalized, will further evaluate with CAT scan of chest with contrast. -Obtain CBC with differential and IgE levels (possible eosinophilic component to her asthma exacerbation)  -obtain a 6 minute walk test. -Continue with Advair 250/50.  Patient educated on the use, administration, side effects, and proper technique of this medication -optimized her GERD treatment with Prilosec 20 mg one tab by mouth twice a day. -she is currently scheduled for a cardiac stress test, will follow up

## 2014-03-06 NOTE — Progress Notes (Signed)
Date: 03/06/2014  MRN# [id number] REYLENE STAUDER 1940-04-02  Referring Physician: Dr. Gemma Payor PMD - Dr. Alvira Philips [facility])  Anita Kent is a 74 y.o. old female seen in consultation for cough and asthma optimization  CC:  Chief Complaint  Patient presents with  . Advice Only    Referred by Dr. Carole Civil pt just finished round of Prednisone 10 mg. She denies cough,wheezing and chest tightness. She has sl. sob with exhertion. She has yellow mucus.    HPI:  Patient is a pleasant 74 year old female presents today for a established care visit for optimization of asthma. Patient states that over the past year her asthma has been difficult to control without the use of steroids almost every 3 months. Currently she is finishing a steroid was tapered for cough and wheeze, which is a typical asthma symptoms. His steroid tapers usually 10-12 days. These tapers with ease her symptoms for about 2-3 months until asthma that acting up again, and will be responsive to steroids. Shge is a vever smoker, but exposure to second hand smoke as a child until 66 yo. Usually has some wheezing about twice a year that clears with steroids prior to 2015. Patient started seen ENT (Dr. Tami Ribas) about 9 years ago for sinus congestion, had allergy testing done (was not a candidate for allergy shot), had nasal septal surgery done, currently on a nasal steroid as needed.  ENT also clinically diagnosed her with Asthma about 9 years ago, she has been on Advair since. Patient has tried [facility] in the past, but did not like the taste and felt it was not providing a benefit, so she continued with Advair.  Two years ago (2014) she was hospitalized for an asthma attack (visitng friends in the [place]), during the spring), admitted for 7 days (received antibiotics and steroids, no intubation).  Patient has never been intubated for respiratory issues. Since that asthma attack, she states that she  has had frequent episodes of her asthma acting up requiring steroids.  No pets at home. 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  Asthma Triggers - unknown No major life changes in the last year that would explain the increase asthma symptoms (no travel, no renovations to the house, no pets, no major illness). Currently able to perform 2 miles in the gym, with mild sob at the end, but will quickly recover. Has gain some weight in the last year due to the recurrent steroid use.  Patient states sometimes at night she will wake up with a sour taste in her mouth and sometime will have a dry cough at night, she is currently on Prilosec once daily. Patient has seen Dr. Raul Del ([facility]) in the past, no change at that time in her asthma regiment due to being well controlled.   2015 Asthma History Urgent Care visits - 2 times Prednisone rx - 4-5 (10-12 day taper) PMD visits for asthma symptoms - 3-4 times Antibiotics - 2 or 3 times Symptoms - wheezing, coughing (productive sputum, yellowish)  Obstructive Sleep Apnea Screening The patient was screened with the STOP-BANG questionnaire. >3 positive responses is considered a positive screen  Snoring  YES Tiredness  NO Observed Apnea YES Pressure (HTN) YES BMI >35  NO (31) Age > 50  YES Neck >16"  NO Gender (female) NO  Total: 4/8  Screen: Positive        PMHX:   Past Medical History  Diagnosis Date  . Asthma   . Cough variant asthma   . HTN (hypertension)   . Diabetes   . Dyslipidemia    Surgical Hx:  Past Surgical History  Procedure Laterality Date  . Primary open reduction procedure  11/09/2013    fragment of bone and fixation using screws   Family Hx:  No family history on file. Social Hx:   History  Substance Use Topics  . Smoking status: Never Smoker   .  Smokeless tobacco: Never Used  . Alcohol Use: No   Medication:   Current Outpatient Rx  Name  Route  Sig  Dispense  Refill  . albuterol (PROVENTIL HFA;VENTOLIN HFA) 108 (90 BASE) MCG/ACT inhaler   Inhalation   Inhale 108 mcg into the lungs every 6 (six) hours as needed.         . Calcium Carbonate-Vitamin D 600-400 MG-UNIT per tablet   Oral   Take 1 tablet by mouth 2 (two) times daily.         . Cholecalciferol (VITAMIN D3) 2000 UNITS capsule   Oral   Take 1 capsule by mouth daily.         . fluticasone (FLONASE) 50 MCG/ACT nasal spray   Each Nare   Place 2 sprays into both nostrils daily as needed.          . Fluticasone-Salmeterol (ADVAIR) 250-50 MCG/DOSE AEPB   Inhalation   Inhale 1 puff into the lungs 2 (two) times daily.         Marland Kitchen ibuprofen (ADVIL,MOTRIN) 200 MG tablet   Oral   Take 400 mg by mouth 2 (two) times daily.         Marland Kitchen LACTOBACILLUS ACID-PECTIN PO   Oral   Take 1 capsule by mouth daily.         Marland Kitchen losartan (COZAAR) 50 MG tablet   Oral   Take 50 mg by mouth daily.         Marland Kitchen losartan-hydrochlorothiazide (HYZAAR) 50-12.5 MG per tablet   Oral   Take 1 tablet by mouth daily.         . montelukast (SINGULAIR) 10 MG tablet   Oral   Take 19 mg by mouth daily.         Marland Kitchen omeprazole (PRILOSEC OTC) 20 MG tablet   Oral   Take 20 mg by mouth daily.             Allergies:  Amoxicillin; Asa; and Levaquin  Review of Systems: Gen:  Denies  fever, sweats, chills HEENT: Denies blurred vision, double vision, ear pain, eye pain, hearing loss, nose bleeds, sore throat Cvc:  No dizziness, chest pain or heaviness Resp:   Denies cough or sputum porduction, shortness of breath Gi: Denies swallowing difficulty, stomach pain, nausea or vomiting, diarrhea, constipation, bowel incontinence Gu:  Denies bladder incontinence, burning urine Ext:   No Joint pain, stiffness or swelling Skin: No skin rash, easy bruising or bleeding or hives Endoc:  No  polyuria, polydipsia , polyphagia or weight change Psych: No depression, insomnia or hallucinations  Other:  All other systems negative  Physical Examination:   VS: BP 126/66 mmHg  Pulse 71  Temp(Src) 98.1 F (36.7 C) (Oral)  Ht 5' (1.524 m)  Wt 158 lb 12.8 oz (72.031 kg)  BMI 31.01 kg/m2  SpO2 96%  General Appearance: No distress  Neuro:without  focal findings, mental status, speech normal, alert and oriented, cranial nerves 2-12 intact, reflexes normal and symmetric, sensation grossly normal  HEENT: PERRLA, EOM intact, no ptosis, no other lesions noticed; Mallampati 3 Pulmonary: normal breath sounds., diaphragmatic excursion normal.No wheezing, No rales;   Sputum Production:  none CardiovascularNormal S1,S2.  No m/r/g.  Abdominal aorta pulsation normal.    Abdomen: Benign, Soft, non-tender, No masses, hepatosplenomegaly, No lymphadenopathy Renal:  No costovertebral tenderness  GU:  No performed at this time. Endoc: No evident thyromegaly, no signs of acromegaly or Cushing features Skin:   warm, no rashes, no ecchymosis  Extremities: normal, no cyanosis, clubbing, no edema, warm with normal capillary refill. Other findings:none   Labs results:   Rad results: (The following images and results were reviewed by Dr. Stevenson Clinch). CXR 09/25/13 FINDINGS: The heart size and mediastinal contours are stable. There is aortic atherosclerosis and a small hiatal hernia. The lungs are clear. There is no pleural effusion or pneumothorax. Mild degenerative changes are present within the thoracic spine associated with a mild convex left scoliosis.  IMPRESSION: Stable chest. No acute cardiopulmonary process.      Assessment and Plan: Asthma, chronic Patient with known asthma, currently not optimized. Unknown etiology of her current asthma triggers, she has had the need for steroids at least 4 times in the past year.  Plan: -Further evaluate of obstruction with primary function testing, and  obtain records from previous pulmonologist. -Continue control of allergic rhinitis/nasal congestion with as needed nasal steroid. -Frequent exacerbation of asthma is of concern for any underlying atypical residual infection from 2014 when she was hospitalized, will further evaluate with CAT scan of chest with contrast. -Obtain CBC with differential and IgE levels (possible eosinophilic component to her asthma exacerbation)  -obtain a 6 minute walk test. -Continue with Advair 250/50.  Patient educated on the use, administration, side effects, and proper technique of this medication -optimized her GERD treatment with Prilosec 20 mg one tab by mouth twice a day. -she is currently scheduled for a cardiac stress test, will follow up   Cough Secondary to asthma. See plan as outlined for asthma.     Updated Medication List Outpatient Encounter Prescriptions as of 03/06/2014  Medication Sig  . albuterol (PROVENTIL HFA;VENTOLIN HFA) 108 (90 BASE) MCG/ACT inhaler Inhale 108 mcg into the lungs every 6 (six) hours as needed.  . Calcium Carbonate-Vitamin D 600-400 MG-UNIT per tablet Take 1 tablet by mouth 2 (two) times daily.  . Cholecalciferol (VITAMIN D3) 2000 UNITS capsule Take 1 capsule by mouth daily.  . fluticasone (FLONASE) 50 MCG/ACT nasal spray Place 2 sprays into both nostrils daily as needed.   . Fluticasone-Salmeterol (ADVAIR) 250-50 MCG/DOSE AEPB Inhale 1 puff into the lungs 2 (two) times daily.  Marland Kitchen ibuprofen (ADVIL,MOTRIN) 200 MG tablet Take 400 mg by mouth 2 (two) times daily.  Marland Kitchen LACTOBACILLUS ACID-PECTIN PO Take 1 capsule by mouth daily.  Marland Kitchen losartan (COZAAR) 50 MG tablet Take 50 mg by mouth daily.  Marland Kitchen losartan-hydrochlorothiazide (HYZAAR) 50-12.5 MG per tablet Take 1 tablet by mouth daily.  . montelukast (SINGULAIR) 10 MG tablet Take 19 mg by mouth daily.  Marland Kitchen omeprazole (PRILOSEC OTC) 20 MG tablet Take 20 mg by mouth daily.  . [DISCONTINUED] mometasone-formoterol (DULERA) 200-5 MCG/ACT  AERO Inhale 2 puffs into the lungs 2 (two) times daily.  . [DISCONTINUED] predniSONE (DELTASONE) 10 MG tablet Take 10 mg by mouth daily with breakfast.    Orders for this visit: Orders Placed This Encounter  Procedures  . CT Chest W Contrast    Standing Status: Future     Number of Occurrences:      Standing Expiration Date: 05/05/2015    Scheduling Instructions:     [place], [place]. Dr. Richmond Campbell or Waymon Amato to read.    Order Specific Question:  Reason for Exam (SYMPTOM  OR DIAGNOSIS REQUIRED)    Answer:  cough    Order Specific Question:  Preferred imaging location?    Answer:  External  . Allergen food profile specific IgE    Standing Status: Future     Number of Occurrences:      Standing Expiration Date: 03/07/2015  . CBC with Differential    Standing Status: Future     Number of Occurrences:      Standing Expiration Date: 03/07/2015  . Pulmonary function test    Standing Status: Future     Number of Occurrences:      Standing Expiration Date: 03/07/2015    Scheduling Instructions:     Already scheduled [place] [place]    Order Specific Question:  Where should this test be performed?    Answer:  [facility]    Order Specific Question:  Full PFT: includes the following: basic spirometry, spirometry pre & post bronchodilator, diffusion capacity (DLCO), lung volumes    Answer:  Full PFT    Order Specific Question:  MIP/MEP    Answer:  No    Order Specific Question:  6 minute walk    Answer:  Yes    Order Specific Question:  ABG    Answer:  No    Order Specific Question:  Diffusion capacity (DLCO)    Answer:  No    Order Specific Question:  Lung volumes    Answer:  No    Order Specific Question:  Methacholine challenge    Answer:  No     Thank  you for the consultation and for allowing [facility] to assist in the care of your patient. Our recommendations are noted above.  Please contact us if we can be of further  service.   Vilinda Boehringer, MD  Pulmonary and Critical Care Office Number: [phone]

## 2014-03-06 NOTE — Assessment & Plan Note (Signed)
Secondary to asthma. See plan as outlined for asthma.

## 2014-03-06 NOTE — Patient Instructions (Addendum)
We will schedule you for lab work at [facility] on [address]. We will set you up to have a CT scan. We will schedule you for breathing test and walk test. Please take Prilosec 20 mg twice daily. Dr. Stevenson Clinch would like to see you back in 1 month.

## 2014-03-07 ENCOUNTER — Encounter: Payer: Self-pay | Admitting: *Deleted

## 2014-03-08 DIAGNOSIS — E119 Type 2 diabetes mellitus without complications: Secondary | ICD-10-CM | POA: Diagnosis not present

## 2014-03-15 ENCOUNTER — Other Ambulatory Visit: Payer: Self-pay | Admitting: Internal Medicine

## 2014-03-15 DIAGNOSIS — Z01812 Encounter for preprocedural laboratory examination: Secondary | ICD-10-CM

## 2014-03-16 DIAGNOSIS — E782 Mixed hyperlipidemia: Secondary | ICD-10-CM | POA: Diagnosis not present

## 2014-03-16 DIAGNOSIS — R0602 Shortness of breath: Secondary | ICD-10-CM | POA: Diagnosis not present

## 2014-03-16 DIAGNOSIS — R9431 Abnormal electrocardiogram [ECG] [EKG]: Secondary | ICD-10-CM | POA: Diagnosis not present

## 2014-03-19 ENCOUNTER — Ambulatory Visit: Payer: Self-pay | Admitting: Physician Assistant

## 2014-03-21 ENCOUNTER — Other Ambulatory Visit: Payer: Self-pay | Admitting: Internal Medicine

## 2014-03-21 DIAGNOSIS — R05 Cough: Secondary | ICD-10-CM

## 2014-03-21 DIAGNOSIS — R059 Cough, unspecified: Secondary | ICD-10-CM

## 2014-03-22 ENCOUNTER — Telehealth: Payer: Self-pay | Admitting: Internal Medicine

## 2014-03-22 NOTE — Telephone Encounter (Signed)
ATC x 2 line busy, [facility]

## 2014-03-23 ENCOUNTER — Ambulatory Visit: Payer: Self-pay | Admitting: Internal Medicine

## 2014-03-23 DIAGNOSIS — R9431 Abnormal electrocardiogram [ECG] [EKG]: Secondary | ICD-10-CM | POA: Diagnosis not present

## 2014-03-23 DIAGNOSIS — J45909 Unspecified asthma, uncomplicated: Secondary | ICD-10-CM | POA: Diagnosis not present

## 2014-03-23 DIAGNOSIS — K449 Diaphragmatic hernia without obstruction or gangrene: Secondary | ICD-10-CM | POA: Diagnosis not present

## 2014-03-23 DIAGNOSIS — K7689 Other specified diseases of liver: Secondary | ICD-10-CM | POA: Diagnosis not present

## 2014-03-23 DIAGNOSIS — J439 Emphysema, unspecified: Secondary | ICD-10-CM | POA: Diagnosis not present

## 2014-03-23 DIAGNOSIS — J432 Centrilobular emphysema: Secondary | ICD-10-CM | POA: Diagnosis not present

## 2014-03-23 DIAGNOSIS — E782 Mixed hyperlipidemia: Secondary | ICD-10-CM | POA: Diagnosis not present

## 2014-03-23 DIAGNOSIS — R0602 Shortness of breath: Secondary | ICD-10-CM | POA: Diagnosis not present

## 2014-03-23 DIAGNOSIS — I10 Essential (primary) hypertension: Secondary | ICD-10-CM | POA: Diagnosis not present

## 2014-03-23 NOTE — Telephone Encounter (Signed)
lmtcb x1 

## 2014-03-26 NOTE — Telephone Encounter (Signed)
Pt states that she has already had her bloodwork and CT done. Nothing further was needed.

## 2014-03-27 ENCOUNTER — Other Ambulatory Visit (INDEPENDENT_AMBULATORY_CARE_PROVIDER_SITE_OTHER): Payer: Medicare Other

## 2014-03-27 DIAGNOSIS — R059 Cough, unspecified: Secondary | ICD-10-CM

## 2014-03-27 DIAGNOSIS — Z01812 Encounter for preprocedural laboratory examination: Secondary | ICD-10-CM

## 2014-03-27 DIAGNOSIS — R05 Cough: Secondary | ICD-10-CM | POA: Diagnosis not present

## 2014-03-28 LAB — BASIC METABOLIC PANEL
BUN/Creatinine Ratio: 14 (ref 11–26)
BUN: 14 mg/dL (ref 8–27)
CO2: 23 mmol/L (ref 18–29)
Calcium: 9.7 mg/dL (ref 8.7–10.3)
Chloride: 102 mmol/L (ref 97–108)
Creatinine, Ser: 0.98 mg/dL (ref 0.57–1.00)
GFR calc Af Amer: 65 mL/min/{1.73_m2} (ref >59)
GFR calc non Af Amer: 57 mL/min/{1.73_m2} — ABNORMAL LOW (ref >59)
Glucose: 106 mg/dL — ABNORMAL HIGH (ref 65–99)
Potassium: 4.2 mmol/L (ref 3.5–5.2)
Sodium: 141 mmol/L (ref 134–144)

## 2014-03-29 LAB — ALLERGEN FOOD PROFILE SPECIFIC IGE
Allergen Apple, IgE: <0.1 kU/L
Allergen Corn, IgE: <0.1 kU/L
Allergen Tomato, IgE: <0.1 kU/L
Chicken IgE: <0.1 kU/L
Codfish IgE: <0.1 kU/L
Egg White IgE: <0.1 kU/L
IgE (Immunoglobulin E), Serum: 60 IU/mL (ref 0–100)
Milk IgE: <0.1 kU/L
Orange: <0.1 kU/L
Peanut IgE: <0.1 kU/L
Shrimp IgE: <0.1 kU/L
Soybean IgE: <0.1 kU/L
Tuna: <0.1 kU/L
Wheat IgE: <0.1 kU/L

## 2014-03-29 LAB — CBC WITH DIFFERENTIAL/PLATELET
Basophils Absolute: 0 10*3/uL (ref 0.0–0.2)
Basos: 0 %
Eos: 11 %
Eosinophils Absolute: 0.9 10*3/uL — ABNORMAL HIGH (ref 0.0–0.4)
HCT: 36.4 % (ref 34.0–46.6)
Hemoglobin: 12.2 g/dL (ref 11.1–15.9)
Immature Grans (Abs): 0 10*3/uL (ref 0.0–0.1)
Immature Granulocytes: 0 %
Lymphocytes Absolute: 1.2 10*3/uL (ref 0.7–3.1)
Lymphs: 15 %
MCH: 28.6 pg (ref 26.6–33.0)
MCHC: 33.5 g/dL (ref 31.5–35.7)
MCV: 85 fL (ref 79–97)
Monocytes Absolute: 0.6 10*3/uL (ref 0.1–0.9)
Monocytes: 8 %
Neutrophils Absolute: 5.2 10*3/uL (ref 1.4–7.0)
Neutrophils Relative %: 66 %
Platelets: 499 10*3/uL — ABNORMAL HIGH (ref 150–379)
RBC: 4.27 x10E6/uL (ref 3.77–5.28)
RDW: 15.4 % (ref 12.3–15.4)
WBC: 7.9 10*3/uL (ref 3.4–10.8)

## 2014-04-03 ENCOUNTER — Ambulatory Visit: Payer: PRIVATE HEALTH INSURANCE | Admitting: Internal Medicine

## 2014-04-03 ENCOUNTER — Ambulatory Visit: Payer: Medicare Other

## 2014-04-05 ENCOUNTER — Encounter: Payer: Self-pay | Admitting: Internal Medicine

## 2014-04-09 DIAGNOSIS — M67442 Ganglion, left hand: Secondary | ICD-10-CM | POA: Diagnosis not present

## 2014-04-10 ENCOUNTER — Ambulatory Visit: Payer: PRIVATE HEALTH INSURANCE | Admitting: Internal Medicine

## 2014-04-16 ENCOUNTER — Ambulatory Visit: Payer: Self-pay | Admitting: Orthopedic Surgery

## 2014-04-16 ENCOUNTER — Telehealth: Payer: Self-pay | Admitting: Internal Medicine

## 2014-04-16 NOTE — Telephone Encounter (Signed)
Lab work has been faxed to [facility]. She is aware and needed nothing further

## 2014-04-19 ENCOUNTER — Ambulatory Visit: Payer: Self-pay | Admitting: Orthopedic Surgery

## 2014-04-19 DIAGNOSIS — M71342 Other bursal cyst, left hand: Secondary | ICD-10-CM | POA: Diagnosis not present

## 2014-04-19 DIAGNOSIS — M67442 Ganglion, left hand: Secondary | ICD-10-CM | POA: Diagnosis not present

## 2014-04-19 DIAGNOSIS — Z881 Allergy status to other antibiotic agents status: Secondary | ICD-10-CM | POA: Diagnosis not present

## 2014-04-19 DIAGNOSIS — I10 Essential (primary) hypertension: Secondary | ICD-10-CM | POA: Diagnosis not present

## 2014-04-19 DIAGNOSIS — J45909 Unspecified asthma, uncomplicated: Secondary | ICD-10-CM | POA: Diagnosis not present

## 2014-04-19 DIAGNOSIS — Z888 Allergy status to other drugs, medicaments and biological substances status: Secondary | ICD-10-CM | POA: Diagnosis not present

## 2014-04-19 DIAGNOSIS — E119 Type 2 diabetes mellitus without complications: Secondary | ICD-10-CM | POA: Diagnosis not present

## 2014-04-19 DIAGNOSIS — R011 Cardiac murmur, unspecified: Secondary | ICD-10-CM | POA: Diagnosis not present

## 2014-04-19 DIAGNOSIS — K219 Gastro-esophageal reflux disease without esophagitis: Secondary | ICD-10-CM | POA: Diagnosis not present

## 2014-04-19 DIAGNOSIS — M6748 Ganglion, other site: Secondary | ICD-10-CM | POA: Diagnosis not present

## 2014-04-25 ENCOUNTER — Ambulatory Visit: Payer: PRIVATE HEALTH INSURANCE | Admitting: Internal Medicine

## 2014-04-25 ENCOUNTER — Ambulatory Visit: Payer: Medicare Other

## 2014-05-01 ENCOUNTER — Ambulatory Visit: Payer: PRIVATE HEALTH INSURANCE | Admitting: Internal Medicine

## 2014-05-01 ENCOUNTER — Ambulatory Visit: Payer: Medicare Other

## 2014-05-08 ENCOUNTER — Ambulatory Visit (INDEPENDENT_AMBULATORY_CARE_PROVIDER_SITE_OTHER): Payer: Medicare Other | Admitting: Internal Medicine

## 2014-05-08 VITALS — BP 142/72 | HR 76 | Ht 60.0 in | Wt 153.0 lb

## 2014-05-08 DIAGNOSIS — J4540 Moderate persistent asthma, uncomplicated: Secondary | ICD-10-CM

## 2014-05-08 DIAGNOSIS — R05 Cough: Secondary | ICD-10-CM | POA: Diagnosis not present

## 2014-05-08 DIAGNOSIS — R059 Cough, unspecified: Secondary | ICD-10-CM

## 2014-05-08 DIAGNOSIS — J432 Centrilobular emphysema: Secondary | ICD-10-CM

## 2014-05-08 DIAGNOSIS — J439 Emphysema, unspecified: Secondary | ICD-10-CM | POA: Insufficient documentation

## 2014-05-08 LAB — PULMONARY FUNCTION TEST
DL/VA % pred: 81 %
DL/VA: 3.46 ml/min/mmHg/L
DLCO unc % pred: 67 %
DLCO unc: 12.75 ml/min/mmHg
FEF 25-75 Post: 1.38 L/sec
FEF 25-75 Pre: 1.14 L/sec
FEF2575-%Change-Post: 20 %
FEF2575-%Pred-Post: 96 %
FEF2575-%Pred-Pre: 80 %
FEV1-%Change-Post: 12 %
FEV1-%Pred-Post: 92 %
FEV1-%Pred-Pre: 82 %
FEV1-Post: 1.6 L
FEV1-Pre: 1.43 L
FEV1FVC-%Change-Post: 9 %
FEV1FVC-%Pred-Pre: 92 %
FEV6-%Change-Post: 6 %
FEV6-%Pred-Post: 96 %
FEV6-%Pred-Pre: 90 %
FEV6-Post: 2.12 L
FEV6-Pre: 1.99 L
FEV6FVC-%Pred-Post: 106 %
FEV6FVC-%Pred-Pre: 106 %
FVC-%Change-Post: 2 %
FVC-%Pred-Post: 90 %
FVC-%Pred-Pre: 88 %
FVC-Post: 2.12 L
FVC-Pre: 2.07 L
Post FEV1/FVC ratio: 76 %
Post FEV6/FVC ratio: 100 %
Pre FEV1/FVC ratio: 69 %
Pre FEV6/FVC Ratio: 100 %
RV % pred: 103 %
RV: 2.16 L
TLC % pred: 97 %
TLC: 4.34 L

## 2014-05-08 MED ORDER — TIOTROPIUM BROMIDE MONOHYDRATE 1.25 MCG/ACT IN AERS: 2.0000 | INHALATION_SPRAY | Freq: Every day | RESPIRATORY_TRACT | Status: DC

## 2014-05-08 MED ORDER — ALBUTEROL SULFATE HFA 108 (90 BASE) MCG/ACT IN AERS: 2.0000 | INHALATION_SPRAY | Freq: Four times a day (QID) | RESPIRATORY_TRACT | Status: DC | PRN

## 2014-05-08 NOTE — Patient Instructions (Addendum)
Follow up with Dr. Stevenson Clinch in 1 month - start spiriva respimat (1.25/actuation) - 2 puff once a day - increase exercise as tolerated - avoid smoke exposure (espeically second hand) - your Pulmonary function test show mild obstruction and your CT Chest shows mild emphysema - cont with advair as directed - rinse after each use - diet and wt loss  - we will refill your albuterol

## 2014-05-08 NOTE — Assessment & Plan Note (Signed)
Patient with known asthma, currently Advair 250/50. Unknown etiology of her current asthma triggers, she has had the need for steroids at least 4 times in the past year. 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  Plan: -Continue control of allergic rhinitis/nasal congestion with as needed nasal steroid. -absolute eosinophil count 0.9, while this is elevated before starting patient on another steroid dose will try to optimize her current underlying lung disease with both Advair and Spiriva. -Continue with Advair 250/50.  Patient educated on the use, administration, side effects, and proper technique of this medication -Spiriva added to the current regiment. -continue with Prilosec 20 mg one tab by mouth twice a day.

## 2014-05-08 NOTE — Progress Notes (Signed)
MRN# [id number] Anita Kent 01/07/1941   CC: Chief Complaint  Patient presents with  . Follow-up    Pt here f/u PFT.      Brief History: 03/06/2014 HPI Patient is a pleasant 74 year old female presents today for a established care visit for optimization of asthma. Patient states that over the past year her asthma has been difficult to control without the use of steroids almost every 3 months. These tapers with ease her symptoms for about 2-3 months until asthma that acting up again, and will be responsive to steroids. She is a Never smoker, but exposure to second hand smoke as a child until 72 yo. Usually has some wheezing about twice a year that clears with steroids prior to 2015. Patient started seen ENT (Dr. Tami Ribas) about 9 years ago for sinus congestion, had allergy testing done (was not a candidate for allergy shot), had nasal septal surgery done, currently on a nasal steroid as needed.   ENT also clinically diagnosed her with Asthma about 9 years ago, she has been on Advair since. Patient has tried [facility] [address] [address] in the past, but did not like the taste and felt it was not providing a benefit, so she continued with Advair.   Two years ago (2014) she was hospitalized for an asthma attack (visitng friends in the [place] during the [place]), admitted for 7 days (received antibiotics and steroids, no intubation).  Patient has never been intubated for respiratory issues. Since that asthma attack, she states that she has had frequent episodes of her asthma acting up requiring steroids.   No pets at home. 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 Currently today she states she still having dry cough, and dyspnea on exertion Rarely using albuterol - current inhaler is approaching expiration date.  Overall she states that she is much improved since her last visit with [place]. Today she likes to the results of her labs, CT chest scan, pulmonary function testing, 6 minute walk test. At her last visit STOP BANG was positive.    PMHX:   Past Medical History  Diagnosis Date  . Asthma   . Cough variant asthma   . HTN (hypertension)   . Diabetes   . Dyslipidemia    Surgical Hx:  Past Surgical History  Procedure Laterality Date  . Primary open reduction procedure  11/09/2013    fragment of bone and fixation using screws   Family Hx:  No family history on file. Social Hx:   History  Substance Use Topics  . Smoking status: Never Smoker   . Smokeless tobacco: Never Used  . Alcohol Use: No   Medication:   Current Outpatient Rx  Name  Route  Sig  Dispense  Refill  . albuterol (PROVENTIL HFA;VENTOLIN HFA) 108 (90 BASE) MCG/ACT inhaler   Inhalation   Inhale 108 mcg into the lungs every 6 (six) hours as needed.         . Calcium Carbonate-Vitamin D 600-400 MG-UNIT per tablet   Oral   Take 1 tablet by mouth 2 (two) times daily.         Marland Kitchen  Cholecalciferol (VITAMIN D3) 2000 UNITS capsule   Oral   Take 1 capsule by mouth daily.         . fluticasone (FLONASE) 50 MCG/ACT nasal spray   Each Nare   Place 2 sprays into both nostrils daily as needed.          . Fluticasone-Salmeterol (ADVAIR) 250-50 MCG/DOSE AEPB   Inhalation   Inhale 1 puff into the lungs 2 (two) times daily.         Marland Kitchen ibuprofen (ADVIL,MOTRIN) 200 MG tablet   Oral   Take 400 mg by mouth 2 (two) times daily.         Marland Kitchen LACTOBACILLUS ACID-PECTIN PO   Oral   Take 1 capsule by mouth daily.         Marland Kitchen losartan (COZAAR) 50 MG tablet   Oral   Take 50 mg by mouth daily.          Marland Kitchen losartan-hydrochlorothiazide (HYZAAR) 50-12.5 MG per tablet   Oral   Take 1 tablet by mouth daily.         . montelukast (SINGULAIR) 10 MG tablet   Oral   Take 19 mg by mouth daily.         Marland Kitchen omeprazole (PRILOSEC OTC) 20 MG tablet   Oral   Take 20 mg by mouth daily.            Review of Systems: Gen:  Denies  fever, sweats, chills HEENT: Denies blurred vision, double vision, ear pain, eye pain, hearing loss, nose bleeds, sore throat Cvc:  No dizziness, chest pain or heaviness Resp:   Mild dyspnea on exertion Gi: Denies swallowing difficulty, stomach pain, nausea or vomiting, diarrhea, constipation, bowel incontinence Gu:  Denies bladder incontinence, burning urine Ext:   No Joint pain, stiffness or swelling Skin: No skin rash, easy bruising or bleeding or hives Endoc:  No polyuria, polydipsia , polyphagia or weight change Psych: No depression, insomnia or hallucinations  Other:  All other systems negative  Allergies:  Amoxicillin; Asa; and Levaquin  Physical Examination:  VS: There were no vitals taken for this visit.  General Appearance: No distress  Neuro: EXAM: without focal findings, mental status, speech normal, alert and oriented, cranial nerves 2-12 grossly normal  HEENT: PERRLA, EOM intact, no ptosis, no other lesions noticed Pulmonary:Exam: normal breath sounds., diaphragmatic excursion normal.No wheezing, No rales   Cardiovascular:@ Exam:  Normal S1,S2.  No m/r/g.     Abdomen:Exam: Benign, Soft, non-tender, No masses  Skin:   warm, no rashes, no ecchymosis  Extremities: normal, no cyanosis, clubbing, no edema, warm with normal capillary refill.   Labs results:  BMP Lab Results  Component Value Date   NA 141 03/27/2014   K 4.2 03/27/2014   CL 102 03/27/2014   CO2 23 03/27/2014   GLUCOSE 106* 03/27/2014   BUN 14 03/27/2014   CREATININE 0.98 03/27/2014     CBC CBC Latest Ref Rng 03/27/2014  WBC 3.4 - 10.8 x10E3/uL 7.9  Hemoglobin 11.1 - 15.9 g/dL  12.2  Hematocrit 34.0 - 46.6 % 36.4  Platelets 150 - 379 x10E3/uL 499(H)     Rad results: (The following images and results were reviewed by Dr. Stevenson Clinch). CT chest February 2016 CLINICAL DATA: Increased cough and wheezing. History of asthma  EXAM: CT CHEST WITH CONTRAST  TECHNIQUE: Multidetector CT imaging of the chest was performed during intravenous contrast administration.  CONTRAST: 75 mL Omnipaque 300 nonionic  COMPARISON: Chest radiograph September 25, 2013  FINDINGS: There is underlying centrilobular emphysematous change. There is no edema or consolidation. There is rather minimal scarring in the lung bases. On axial slice 39 series 3, there is a 2 mm nodular opacity in the lateral segment of the right middle lobe. No larger nodular opacities are identified.  There is atherosclerotic change in the aorta without aneurysm or dissection. No demonstrable pulmonary embolus. There is no appreciable thoracic adenopathy. There are multiple foci of coronary artery calcification.  There is a moderate hiatal hernia.  In the visualized upper abdomen, there are multiple cysts throughout the liver ranging in size from as small as 3 mm to as large as 2.7 x 2.5 cm. There is atherosclerotic change in the visualized upper abdominal aorta and splenic artery.  There are no blastic or lytic bone lesions. Thyroid appears unremarkable.  IMPRESSION: Underlying emphysema. No edema or consolidation.  2 mm nodular opacity lateral segment right middle lobe. Followup of this nodular opacity should be based on Fleischner Society guidelines. If the patient is at high risk for bronchogenic carcinoma, follow-up chest CT at 1 year is recommended. If the patient is at low risk, no follow-up is needed. This recommendation follows the consensus statement: Guidelines for Management of Small Pulmonary Nodules Detected on CT Scans: A Statement from the [place] as published in Radiology  2005; 237:395-400.  No adenopathy. Moderate hiatal hernia. Multiple cysts in liver. Multiple foci of atherosclerotic change.  Pulmonary function tests 05/08/2014 FEV1 82% FEV1/FVC 69% ERV 31 DLCO 67 Post bronchodilator FEV1 12% change Impression: Mild obstructive disease with good bronchodilator response, severe decrease in ERV most likely secondary to abdominal obesity and hiatal hernia, moderate decrease in DLCO.  6 minute walk test: 285 m/935 feet, lowest saturation 95%.   CAT scan of chest February 2016 -Underlying emphysematous changes with scarring at the bases bilaterally.     Assessment and Plan:74 year old female past medical history of asthma, recently seen due to having for asthma exacerbations requiring steroids in the past one year, presents today for followup of results of CT chest, pulmonary function testing, 6 minute walk test. Emphysema lung Noted to have mild to moderate emphysematous changes on CT chest with mild scarring at the bases bilaterally. Given her current history of asthma this could also be adding to her coughing dyspnea. 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  Plan: -Currently on Advair for her asthma will add Spiriva for further optimization of the COPD component for her underlying structural lung disease. -Patient educated on the use, administration, side effects of Spiriva. -Patient's centrilobular emphysema more on the mild side and she was started on lower dose Spiriva respimat.    Asthma, chronic Patient with known asthma, currently Advair 250/50. Unknown etiology of her current asthma triggers, she has had the need for steroids at least 4 times in the past year. 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  Plan: -Continue control of allergic rhinitis/nasal  congestion with as needed nasal steroid. -absolute eosinophil count 0.9, while this is elevated before starting patient on another steroid dose will try to optimize her current underlying lung disease with both Advair and Spiriva. -Continue with Advair 250/50.  Patient educated on the use, administration, side effects, and proper technique of this medication -Spiriva added to the current regiment. -continue with Prilosec 20 mg one tab by  mouth twice a day.      Cough Currently resolving well, still with a mild "tickle" in throat. See plan for asthma and emphysema.     Updated Medication List Outpatient Encounter Prescriptions as of 05/08/2014  Medication Sig  . albuterol (PROVENTIL HFA;VENTOLIN HFA) 108 (90 BASE) MCG/ACT inhaler Inhale 108 mcg into the lungs every 6 (six) hours as needed.  . Calcium Carbonate-Vitamin D 600-400 MG-UNIT per tablet Take 1 tablet by mouth 2 (two) times daily.  . Cholecalciferol (VITAMIN D3) 2000 UNITS capsule Take 1 capsule by mouth daily.  . fluticasone (FLONASE) 50 MCG/ACT nasal spray Place 2 sprays into both nostrils daily as needed.   . Fluticasone-Salmeterol (ADVAIR) 250-50 MCG/DOSE AEPB Inhale 1 puff into the lungs 2 (two) times daily.  Marland Kitchen ibuprofen (ADVIL,MOTRIN) 200 MG tablet Take 400 mg by mouth 2 (two) times daily.  Marland Kitchen LACTOBACILLUS ACID-PECTIN PO Take 1 capsule by mouth daily.  Marland Kitchen losartan (COZAAR) 50 MG tablet Take 50 mg by mouth daily.  Marland Kitchen losartan-hydrochlorothiazide (HYZAAR) 50-12.5 MG per tablet Take 1 tablet by mouth daily.  . montelukast (SINGULAIR) 10 MG tablet Take 19 mg by mouth daily.  Marland Kitchen omeprazole (PRILOSEC OTC) 20 MG tablet Take 20 mg by mouth daily.    Orders for this visit: No orders of the defined types were placed in this encounter.    Thank  you for the visitation and for allowing  [facility] to assist in the care of your patient. Our recommendations are noted above.  Please contact us if we can be of  further service.  Vilinda Boehringer, MD [facility] Office Number: [phone]

## 2014-05-08 NOTE — Progress Notes (Signed)
SMW performed today. 

## 2014-05-08 NOTE — Assessment & Plan Note (Signed)
Currently resolving well, still with a mild "tickle" in throat. See plan for asthma and emphysema.

## 2014-05-08 NOTE — Progress Notes (Signed)
PFT performed.

## 2014-05-08 NOTE — Assessment & Plan Note (Signed)
Noted to have mild to moderate emphysematous changes on CT chest with mild scarring at the bases bilaterally. Given her current history of asthma this could also be adding to her coughing dyspnea. 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  Plan: -Currently on Advair for her asthma will add Spiriva for further optimization of the COPD component for her underlying structural lung disease. -Patient educated on the use, administration, side effects of Spiriva. -Patient's centrilobular emphysema more on the mild side and she was started on lower dose Spiriva respimat.

## 2014-05-15 ENCOUNTER — Telehealth: Payer: Self-pay | Admitting: *Deleted

## 2014-05-15 NOTE — Telephone Encounter (Signed)
Received PA from [facility] [address]. Called [phone] to initiate PA for Proventil HFA Aer for #1 with 11 additional refills. Was asked if patient had built up tolerance to other forms of medication? Answered No.  Pt ID # [id number] Pt has Medicare AARP.  Spoke with Rise Paganini and gave asthma (J45.909)and emphysema (J43.9) as dx for medication.  Medication will be up for clinical review and decision will be faxed in 24 hrs triage fax # [phone].

## 2014-05-16 MED ORDER — ALBUTEROL SULFATE 108 (90 BASE) MCG/ACT IN AEPB: 2.0000 | INHALATION_SPRAY | Freq: Four times a day (QID) | RESPIRATORY_TRACT | Status: DC | PRN

## 2014-05-16 NOTE — Telephone Encounter (Signed)
[facility] also submitted a request for [employer]. Per Imari no PA is needed for this medication. Pt has already p/u on 05/14/14 LT#[id number] [place]

## 2014-05-16 NOTE — Telephone Encounter (Signed)
Denial from patient insurance for her Proventil, HFA inhaler. Left a detailed message on patient machine letting her know. Also letting her know Proair Respiclick would be sent to her pharmacy. She would have to try and fail this one before ins will approve Proventil.

## 2014-05-16 NOTE — Telephone Encounter (Signed)
Prior authorization was started and denied for Proventil HFA. This medication is not a covered medication by Medicare part D. The patient must first try and fail Proair Respiclick.

## 2014-05-17 ENCOUNTER — Telehealth: Payer: Self-pay | Admitting: Internal Medicine

## 2014-05-17 MED ORDER — PREDNISONE 10 MG PO TABS: ORAL_TABLET | ORAL | Status: DC

## 2014-05-17 NOTE — Telephone Encounter (Signed)
Spoke with the pt and notified of recs per Dr. Annamaria Boots  She verbalized understanding  Rx sent to [facility]

## 2014-05-17 NOTE — Telephone Encounter (Signed)
Offer prednisone 10 mg, # 30   4 x 3 days, 3 x 3 days, 2 x 3 days, 1 x 3 days

## 2014-05-17 NOTE — Telephone Encounter (Signed)
Spoke with pt, c/o sinus congestion, PND, prod cough with yellow mucus Xfew days.  Denies fever, sob.   Is requesting a 12 day pred taper.   Pt uses [facility] on [address].    Sending to doc of day as VM is out of the office.  Dr. Annamaria Boots please advise.  Thanks!  Allergies  Allergen Reactions  . Amoxicillin Hives  . Asa [Aspirin] Hives  . Levaquin [Levofloxacin In D5w] Hives   Current Outpatient Prescriptions on File Prior to Visit  Medication Sig Dispense Refill  . Albuterol Sulfate (PROAIR RESPICLICK) 315 (90 BASE) MCG/ACT AEPB Inhale 2 puffs into the lungs every 6 (six) hours as needed. 1 each 11  . Calcium Carbonate-Vitamin D 600-400 MG-UNIT per tablet Take 1 tablet by mouth 2 (two) times daily.    . Cholecalciferol (VITAMIN D3) 2000 UNITS capsule Take 1 capsule by mouth daily.    . Fluticasone-Salmeterol (ADVAIR) 250-50 MCG/DOSE AEPB Inhale 1 puff into the lungs 2 (two) times daily.    Marland Kitchen ibuprofen (ADVIL,MOTRIN) 200 MG tablet Take 400 mg by mouth 2 (two) times daily.    Marland Kitchen LACTOBACILLUS ACID-PECTIN PO Take 1 capsule by mouth daily.    Marland Kitchen loratadine (CLARITIN) 10 MG tablet Take 10 mg by mouth daily.    Marland Kitchen losartan-hydrochlorothiazide (HYZAAR) 50-12.5 MG per tablet Take 1 tablet by mouth daily.    Marland Kitchen omeprazole (PRILOSEC OTC) 20 MG tablet Take 20 mg by mouth 2 (two) times daily.     . Tiotropium Bromide Monohydrate (SPIRIVA RESPIMAT) 1.25 MCG/ACT AERS Inhale 2 puffs into the lungs daily. 4 g 3   No current facility-administered medications on file prior to visit.

## 2014-06-10 NOTE — Discharge Summary (Signed)
PATIENT NAME:  Anita Kent, Anita Kent MR#:  [id number] DATE OF BIRTH:  1940-12-30  DATE OF ADMISSION:  06/26/2011 DATE OF DISCHARGE:  06/29/2011  ADMISSION DIAGNOSIS: Trimalleolar right ankle fracture dislocation.   DISCHARGE DIAGNOSIS: Trimalleolar right ankle fracture dislocation.   PROCEDURE: Open reduction, internal fixation medial and lateral malleoli. Anesthesia: Spinal. Surgeon: Laurene Footman, MD. Implants: Biomet six-hole anatomic fibular locking plate with multiple locking screws and nonlocking screws as well as a 4.0 cannulated 36 mm screw from the Bi[employer] small fragment tray. Complications: None. Estimated blood loss 25 mL.  The patient was stabilized, brought to the recovery room, and brought down to the orthopedic floor where she was treated by physical therapy and for pain control.   HISTORY: The patient is a 74 year old white female who was at her home, stepped out on her porch and missed a step, injuring her right ankle. She had immediate pain and was unable to bear weight. She was brought to the Emergency Room where reduction of a trimalleolar fracture-dislocation was performed and a splint applied. She was admitted for treatment of this. 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   PHYSICAL EXAMINATION: GENERAL: Well-developed, well-nourished, white female who appears her stated age, in mild distress. HEENT is unremarkable. LUNGS: Clear. No wheezing noted. HEART: Regular rate and rhythm. ABDOMEN: Soft, nontender. EXTREMITIES: The right lower extremity shows abrasion to the anterior knee. She is in a posterior splint. She has intact sensation in the toes and is able to flex and extend the toes.   HOSPITAL COURSE: The patient was admitted to the hospital on 06/26/2011 through the hospital. On that same day she underwent open reduction and internal fixation of the medial and lateral malleoli. The patient had good pain control afterwards and was brought to the Orthopedic floor  from the PACU. The patient tolerated physical therapy very well. Her pain was under control. The patient had a normal bowel movement, and by 06/29/2011 the patient was stable and ready to go home. A cast was applied. Condition at discharge was stable.   DISPOSITION: The patient was sent to a rehab facility.   DISCHARGE INSTRUCTIONS:  1. The patient may put some weight on the affected extremity. She is to do toe-touch weight-bearing.  2. She may elevate the affected foot/leg on 1 or 2 pillows with the foot higher than the knee.  3. She needs to resume a regular diet as tolerated.  4. Resume her typical home medications.  5. She is to take Vicodin 1 to 2 tablets every 4 hours as needed for pain and oxycodone 5 to 10 mg every 4 hours as needed for pain.  6. She is to keep ice on the affected area.  7. Do not get the dressing or cast wet or dirty. She is to call [facility] if the dressing gets water under it. She is to leave the dressing on and not place any objects under the cast.  8. Physical therapy per protocol and occupational therapy is to be ordered for her.   DISCHARGE MEDICATIONS:  1. Advair Diskus 250 mcg, 50 mcg inhalation powder, 1 puff inhaled 2 times a day.  2. Hydrochlorothiazide/losartan 12.5 mg/50 mg oral tablet, 1 tablet orally once a day.  3. Terbinafine 250 mg oral tablet, 1 tablet once a day.   ____________________________ Duanne Guess, PA-C tcg:cbb D: 06/29/2011 12:17:15 ET T: 06/29/2011 12:28:37 ET JOB#: [id number]  cc: Duanne Guess, PA-C, <Dictator> Duanne Guess PA ELECTRONICALLY  SIGNED 07/06/2011 13:46

## 2014-06-10 NOTE — Op Note (Signed)
PATIENT NAME:  Anita Kent, RECKTENWALD MR#:  [id number] DATE OF BIRTH:  1940-02-19  DATE OF PROCEDURE:  06/26/2011  PREOPERATIVE DIAGNOSIS: Trimalleolar right ankle fracture-dislocation.   POSTOPERATIVE DIAGNOSIS: Trimalleolar right ankle fracture-dislocation.   PROCEDURE: Open reduction internal fixation medial and lateral malleoli.   SURGEON: Laurene Footman, MD  ANESTHESIA: Spinal.   DESCRIPTION OF PROCEDURE: Patient brought to the Operating Room and after adequate spinal anesthesia was obtained, a bump was placed underneath the right buttock to internally rotate the leg. The leg was prepped and draped in the usual sterile fashion with a tourniquet applied to the upper thigh after patient identification and timeout procedures were completed. The tourniquet was raised to 300 mmHg. A distal fibular incision was made with the aid of mini C-arm and the subcutaneous tissue spread exposing the distal fibula fracture. A 6-hole anatomic fibular locking plate for the right from Biomet was then placed subcutaneously up the shaft of the fibula over the distal fibula, again checking position on the mini C-arm. A K wire was used to fix it distally. Going proximally, the proximal end of the plate was exposed making a proximal incision and retracting the peroneal muscle posteriorly. The plate was then applied to the fibular shaft and three proximal nonlocking screws were inserted. Following this one additional screw was placed percutaneously and again in a nonlocking fashion to get better compression of the fracture site. Essentially anatomic alignment was obtained by this means. Locking screws were then placed in the more distal portion of the locking plate, drilling, measuring and placing the locking screws. Essentially anatomic alignment was obtained without opening the fracture site. The wounds were irrigated then closed with 2-0 Vicryl subcutaneously and staples. Going medially, an anteromedial approach was made and  tissue was spread. The fracture was identified and reduced, however, after placing a 35 mm screw the C-arm did not show the fracture to be reduced. There was in fact a small cortical fragment that had broken off from the main fragment and was covering it. After discovering this, the main distal fragment was held in a reduced position. A K wire was inserted followed by a cannulated screw, 36 mm in length. It gave very good compression and stability. The K wires removed. On stress views the fracture was well aligned without any evidence of displacement and on the lateral view the posterior malleolus was near anatomically reduced and did not require additional fixation. At this point the wound was irrigated and closed with 2-0 Vicryl and staples. Xeroform, 4 x 4's, Webril, and stirrup splint applied followed by an Ace wrap. Tourniquet was let down. Tourniquet time was 67 minutes at 300 mmHg.   IMPLANTS: Biomet 6-hole anatomic fibular locking plate with multiple locking screws and nonlocking screws as well as a 4.0 cannulated 36 mm screw from the Biomet small fragment tray .   COMPLICATIONS: None.   ESTIMATED BLOOD LOSS: 25 mL.   ____________________________ Laurene Footman, MD mjm:cms D: 06/26/2011 20:38:39 ET T: 06/27/2011 14:45:47 ET JOB#: [id number]  cc: Laurene Footman, MD, <Dictator> Laurene Footman MD ELECTRONICALLY SIGNED 06/28/2011 7:30

## 2014-06-10 NOTE — H&P (Signed)
PATIENT NAME:  Anita Kent, Anita Kent MR#:  [id number] DATE OF BIRTH:  01/03/1941  DATE OF ADMISSION:  06/26/2011  CHIEF COMPLAINT:  Right ankle pain and deformity.   HISTORY OF PRESENT ILLNESS: The patient is a 74 year old white female who was at her home. She stepped out on her porch and missed a step, injuring her right ankle. She had immediate pain and was unable to bear weight. She was brought to the Emergency Room where reduction of a trimalleolar ankle fracture-dislocation was performed and a splint applied. She is being admitted for treatment of this. 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  SOCIAL HISTORY: She lives alone, widowed and retired. She is a nondrinker, nonsmoker.   PAST MEDICAL HISTORY: 1. Reactive airway disease/ asthma, particularly when working out in the yard. 2. Hypertension.  No other chronic medical problems.   MEDICATIONS ON ADMISSION:  1. Terbinafine 250 mg daily.  2. Hydrochlorothiazide/losartan 12.5/50 daily. 3. Advair Diskus 250/50 1 puff inhaled twice a day.   PAST SURGICAL HISTORY:  1. Cataract surgery bilateral. 2. Prior sinus surgery.  REVIEW OF SYSTEMS:  Positive just for the ankle pain.   PHYSICAL EXAMINATION:  GENERAL: Well-developed, well-nourished white female who appears her stated age in mild distress.   HEENT: Unremarkable.   LUNGS: Clear. No wheezing noted.   HEART: Regular rate and rhythm.   ABDOMEN: Soft, nontender.   EXTREMITIES: The right lower extremity shows abrasion to the anterior knee. She is in a posterior splint. She has intact sensation to the toes and able to flex and extend the toes.   X-RAYS: Pre- and postreduction x-rays have been reviewed. She has a distal fibular fracture, long oblique, medial malleolar displaced fracture, small posterior malleolar fracture.   CLINICAL IMPRESSION: Right ankle trimalleolar ankle fracture-dislocation post closed reduction by Emergency Room staff.   PLAN: Open  reduction and internal fixation of right ankle today. Discussed the risks, benefits, possible complications, and alternatives. This is the accepted treatment for this condition. She understands this and we will hopefully get that done early today and get her into physical therapy tomorrow. She lives alone, but she does feel like she will be able to return home, that she will be able to get help to stay with her.    ____________________________ Laurene Footman, MD mjm:bjt D: 06/26/2011 07:57:37 ET T: 06/26/2011 08:32:20 ET JOB#: [id number]  cc: Laurene Footman, MD, <Dictator> Laurene Footman MD ELECTRONICALLY SIGNED 06/26/2011 17:05

## 2014-06-10 NOTE — Op Note (Signed)
PATIENT NAME:  Anita Kent, Anita Kent MR#:  [id number] DATE OF BIRTH:  [birth year]  DATE OF PROCEDURE:  06/09/2011  PREOPERATIVE DIAGNOSIS: Visually significant cataract of the right eye.   POSTOPERATIVE DIAGNOSIS: Visually significant cataract of the right eye.   OPERATIVE PROCEDURE: Cataract extraction by phacoemulsification with implant of intraocular lens to right eye.   SURGEON: Birder Robson, MD.   ANESTHESIA:  1. Managed anesthesia care.  2. Topical tetracaine drops followed by 2% Xylocaine jelly applied in the preoperative holding area.   COMPLICATIONS: None.   TECHNIQUE:  Stop and chop.   DESCRIPTION OF PROCEDURE: The patient was examined and consented in the preoperative holding area where the aforementioned topical anesthesia was applied to the right eye and then brought back to the Operating Room where the right eye was prepped and draped in the usual sterile ophthalmic fashion and a lid speculum was placed. A paracentesis was created with the side port blade and the anterior chamber was filled with viscoelastic. A near clear corneal incision was performed with the steel keratome. A continuous curvilinear capsulorrhexis was performed with a cystotome followed by the capsulorrhexis forceps. Hydrodissection and hydrodelineation were carried out with BSS on a blunt cannula. The lens was removed in a stop and chop technique and the remaining cortical material was removed with the irrigation-aspiration handpiece. The capsular bag was inflated with viscoelastic and the Technis ZCB00 21.5-diopter lens, serial number [id number] was placed in the capsular bag without complication. The remaining viscoelastic was removed from the eye with the irrigation-aspiration handpiece. The wounds were hydrated. The anterior chamber was flushed with Miostat and the eye was inflated to physiologic pressure. The wounds were found to be water tight. The eye was dressed with Vigamox. The patient was given protective  glasses to wear throughout the day and a shield with which to sleep tonight. The patient was also given drops with which to begin a drop regimen today and will follow-up with me in one day.   ____________________________ Livingston Diones. Tisheena Maguire, MD wlp:cms D: 06/09/2011 12:28:26 ET T: 06/09/2011 12:37:19 ET JOB#: [id number]  cc: Warren Kugelman L. Grant Henkes, MD, <Dictator> Livingston Diones Wilton Thrall MD ELECTRONICALLY SIGNED 06/16/2011 11:42

## 2014-06-10 NOTE — Op Note (Signed)
PATIENT NAME:  Anita Kent, SARIN MR#:  [id number] DATE OF BIRTH:  1940-04-03  DATE OF PROCEDURE:  04/21/2011  PREOPERATIVE DIAGNOSIS: Visually significant cataract of the left eye.   POSTOPERATIVE DIAGNOSIS: Visually significant cataract of the left eye.   OPERATIVE PROCEDURE: Cataract extraction by phacoemulsification with implant of intraocular lens to left eye.   SURGEON: Birder Robson, MD.   ANESTHESIA:  1. Managed anesthesia care.  2. Topical tetracaine drops followed by 2% Xylocaine jelly applied in the preoperative holding area.   COMPLICATIONS: None.   TECHNIQUE:  Stop and chop.  DESCRIPTION OF PROCEDURE: The patient was examined and consented in the preoperative holding area where the aforementioned topical anesthesia was applied to the left eye and then brought back to the Operating Room where the left eye was prepped and draped in the usual sterile ophthalmic fashion and a lid speculum was placed. A paracentesis was created with the side port blade and the anterior chamber was filled with viscoelastic. A near clear corneal incision was performed with the steel keratome. A continuous curvilinear capsulorrhexis was performed with a cystotome followed by the capsulorrhexis forceps. Hydrodissection and hydrodelineation were carried out with BSS on a blunt cannula. The lens was removed in a stop and chop technique and the remaining cortical material was removed with the irrigation-aspiration handpiece. The capsular bag was inflated with viscoelastic and the Technis ZCBOO 21.5-diopter lens, serial number [id number], was placed in the capsular bag without complication. The remaining viscoelastic was removed from the eye with the irrigation-aspiration handpiece. The wounds were hydrated. The anterior chamber was flushed with Miostat and the eye was inflated to physiologic pressure. The wounds were found to be water tight. The eye was dressed with Vigamox. The patient was given protective  glasses to wear throughout the day and a shield with which to sleep tonight. The patient was also given drops with which to begin a drop regimen today and will follow-up with me in one day.   ____________________________ Livingston Diones. Vishruth Seoane, MD wlp:cbb D: 04/21/2011 15:37:52 ET T: 04/21/2011 16:01:05 ET JOB#: [id number]  cc: Delan Ksiazek L. Esli Jernigan, MD, <Dictator> Livingston Diones Farhaan Mabee MD ELECTRONICALLY SIGNED 04/22/2011 17:09

## 2014-06-11 LAB — SURGICAL PATHOLOGY

## 2014-06-12 ENCOUNTER — Ambulatory Visit: Payer: Medicare Other | Admitting: Internal Medicine

## 2014-06-17 NOTE — Op Note (Signed)
PATIENT NAME:  Anita Kent, FEGGINS MR#:  [id number] DATE OF BIRTH:  1940-09-18  DATE OF PROCEDURE:  04/19/2014  PREOPERATIVE DIAGNOSIS: Ganglion of the flexor tendon sheath left thumb.   POSTOPERATIVE DIAGNOSIS: Ganglion of the flexor tendon sheath left thumb.   PROCEDURE: Excision of cyst, left thumb.   ANESTHESIA: General.   SURGEON: Hessie Knows, M.D.   DESCRIPTION OF PROCEDURE: The patient was brought to the operating room and after adequate anesthesia was obtained, the left arm was prepped and draped in the usual sterile fashion with a tourniquet applied to the upper arm. After patient identification and timeout procedure were completed, the tourniquet was raised to 250 mmHg. A curvilinear incision was made centered over the mass, which measured approximately 2.5 cm diameter. The subcutaneous tissue was spread. The cyst wall was identified and separated from the surrounding soft tissue. The cyst was incised and there were thick gelatinous contents present which were removed with the use of a rongeur. Next, the cyst was removed without difficulty separating from the adjacent tissue. The flexor tendon sheath was then opened and the area of the original cause of the cyst excised. The wound was irrigated and then some of the excess skin was removed. Skin was closed with simple interrupted 4-0 nylon. Then, 10 mL of 0.5% Sensorcaine without epinephrine infiltrated. Compressive dressing of Xeroform, 4 x 4's, Webril, and Ace wrap applied. Tourniquet time was minutes. Specimen was cyst. There were no complications.   ____________________________ Laurene Footman, MD mjm:am D: 04/19/2014 18:10:52 ET T: 04/20/2014 03:19:05 ET JOB#: [id number]  cc: Laurene Footman, MD, <Dictator> Laurene Footman MD ELECTRONICALLY SIGNED 04/20/2014 7:55

## 2014-08-13 ENCOUNTER — Other Ambulatory Visit
Admission: RE | Admit: 2014-08-13 | Discharge: 2014-08-13 | Disposition: A | Discharge status: Home / Self Care | Payer: Medicare Other | Source: Ambulatory Visit | Attending: Internal Medicine | Admitting: Internal Medicine

## 2014-08-13 ENCOUNTER — Ambulatory Visit (INDEPENDENT_AMBULATORY_CARE_PROVIDER_SITE_OTHER): Payer: Medicare Other | Admitting: Internal Medicine

## 2014-08-13 VITALS — BP 152/70 | HR 98 | Temp 97.6°F | Ht 60.0 in | Wt 156.0 lb

## 2014-08-13 DIAGNOSIS — J4540 Moderate persistent asthma, uncomplicated: Secondary | ICD-10-CM | POA: Diagnosis not present

## 2014-08-13 DIAGNOSIS — J9801 Acute bronchospasm: Secondary | ICD-10-CM | POA: Diagnosis not present

## 2014-08-13 DIAGNOSIS — R05 Cough: Secondary | ICD-10-CM

## 2014-08-13 DIAGNOSIS — R059 Cough, unspecified: Secondary | ICD-10-CM

## 2014-08-13 DIAGNOSIS — R0602 Shortness of breath: Secondary | ICD-10-CM | POA: Diagnosis not present

## 2014-08-13 DIAGNOSIS — J019 Acute sinusitis, unspecified: Secondary | ICD-10-CM

## 2014-08-13 LAB — CBC WITH DIFFERENTIAL/PLATELET
Basophils Absolute: 0 10*3/uL (ref 0–0.1)
Basophils Relative: 1 %
Eosinophils Absolute: 1.1 10*3/uL — ABNORMAL HIGH (ref 0–0.7)
Eosinophils Relative: 16 %
HCT: 36.6 % (ref 35.0–47.0)
Hemoglobin: 11.9 g/dL — ABNORMAL LOW (ref 12.0–16.0)
Lymphocytes Relative: 19 %
Lymphs Abs: 1.4 10*3/uL (ref 1.0–3.6)
MCH: 28.1 pg (ref 26.0–34.0)
MCHC: 32.5 g/dL (ref 32.0–36.0)
MCV: 86.3 fL (ref 80.0–100.0)
Monocytes Absolute: 0.5 10*3/uL (ref 0.2–0.9)
Monocytes Relative: 7 %
Neutro Abs: 4 10*3/uL (ref 1.4–6.5)
Neutrophils Relative %: 57 %
Platelets: 401 10*3/uL (ref 150–440)
RBC: 4.24 MIL/uL (ref 3.80–5.20)
RDW: 14.9 % — ABNORMAL HIGH (ref 11.5–14.5)
WBC: 7.1 10*3/uL (ref 3.6–11.0)

## 2014-08-13 MED ORDER — AMOXICILLIN-POT CLAVULANATE ER 1000-62.5 MG PO TB12: 1.0000 | ORAL_TABLET | Freq: Two times a day (BID) | ORAL | Status: DC

## 2014-08-13 NOTE — Progress Notes (Signed)
MRN# [id number] Anita Kent 1940/12/15   CC: Chief Complaint  Patient presents with  . Follow-up    pt reports sob with exertion; She is having some nasal congestion; ear clogged up. She is cough yellow mucus worsening last couple of weeks.      Brief History: 03/06/2014 HPI Patient is a pleasant 74 year old female presents today for a established care visit for optimization of asthma. Patient states that over the past year her asthma has been difficult to control without the use of steroids almost every 3 months. These tapers with ease her symptoms for about 2-3 months until asthma that acting up again, and will be responsive to steroids. She is a Never smoker, but exposure to second hand smoke as a child until 74 yo. Usually has some wheezing about twice a year that clears with steroids prior to 2015. Patient started seen ENT (Dr. Tami Ribas) about 9 years ago for sinus congestion, had allergy testing done (was not a candidate for allergy shot), had nasal septal surgery done, currently on a nasal steroid as needed.  ENT also clinically diagnosed her with Asthma about 9 years ago, she has been on Advair since. Patient has tried [facility] in the past, but did not like the taste and felt it was not providing a benefit, so she continued with Advair.  Two years ago (2014) she was hospitalized for an asthma attack (visitng friends in the [place]), during the spring), admitted for 7 days (received antibiotics and steroids, no intubation). Patient has never been intubated for respiratory issues. Since that asthma attack, she states that she has had frequent episodes of her asthma acting up requiring steroids.  No pets at home. 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 Currently today she states she still having dry cough, and dyspnea on exertion Rarely using albuterol - current inhaler is approaching expiration date.  Overall she states that she is much improved since her last visit with [place]. Today she likes to the results of her labs, CT chest scan, pulmonary function testing, 6 minute walk test. At her last visit STOP [facility]ANG was positive.  Plan: -Continue control of allergic rhinitis/nasal congestion with as needed nasal steroid. -absolute eosinophil count 0.9, while this is elevated before starting patient on another steroid dose will try to optimize her current underlying lung disease with both Advair and Spiriva. -Continue with Advair 250/50. Patient educated on the use, administration, side effects, and proper technique of this medication -Spiriva added to the current regiment. -continue with Prilosec 20 mg one tab by mouth twice a day. -Currently on Advair for her asthma will add Spiriva for further optimization of the COPD component for her underlying structural lung disease.  Events since last clinic visit: Patient presents today for follow-up visit of asthma/COPD/chronic cough, she states that she's been in a mother for the past 6 weeks, and has noticed that over the past week she's had more "head congestion". This is also associated with cough, which is productive, yellowish in color. Head congestion, cough is also making her more short of breath especially with exertion;  she has a nebulizer at home which she is currently using one nebulized actuation daily for the past 1 week. She has not had to use her rescue inhaler. Patient also stated she's had a hoarse voice.   Medication:   Current Outpatient Rx  Name  Route  Sig  Dispense  Refill  . albuterol (PROVENTIL) (2.5  MG/3ML) 0.083% nebulizer solution   Nebulization   Take 2.5 mg by nebulization every 6 (six) hours as needed for wheezing or shortness of breath.         . Albuterol Sulfate (PROAIR RESPICLICK) 400 (90 BASE) MCG/ACT AEPB   Inhalation   Inhale 2 puffs into the lungs every 6 (six) hours as needed.   1 each   11   . Calcium Carbonate-Vitamin D 600-400 MG-UNIT per tablet   Oral   Take 1 tablet by mouth 2 (two) times daily.         . Cholecalciferol (VITAMIN D3) 2000 UNITS capsule   Oral   Take 1 capsule by mouth daily.         . Fluticasone-Salmeterol (ADVAIR) 250-50 MCG/DOSE AEPB   Inhalation   Inhale 1 puff into the lungs 2 (two) times daily.         Marland Kitchen ibuprofen (ADVIL,MOTRIN) 200 MG tablet   Oral   Take 400 mg by mouth as needed.          Marland Kitchen LACTOBACILLUS ACID-PECTIN PO   Oral   Take 1 capsule by mouth daily.         Marland Kitchen loratadine (CLARITIN) 10 MG tablet   Oral   Take 10 mg by mouth daily.         Marland Kitchen losartan-hydrochlorothiazide (HYZAAR) 50-12.5 MG per tablet   Oral   Take 1 tablet by mouth daily.         Marland Kitchen omeprazole (PRILOSEC OTC) 20 MG tablet   Oral   Take 20 mg by mouth daily.          . Tiotropium Bromide Monohydrate (SPIRIVA RESPIMAT) 1.25 MCG/ACT AERS   Inhalation   Inhale 2 puffs into the lungs daily.   4 g   3      Review of Systems: Gen:  Denies  fever, sweats, chills HEENT: Denies blurred vision, double vision, ear pain, eye pain, hearing loss, nose bleeds, sore throat Cvc:  No dizziness, chest pain or heaviness Resp:   Admits to:sob with exertion, hoarse voice, productive sputum  Gi: Denies swallowing difficulty, stomach pain, nausea or vomiting, diarrhea, constipation, bowel incontinence Gu:  Denies bladder incontinence, burning urine Ext:   No Joint pain, stiffness or swelling Skin: No skin rash, easy bruising or bleeding or hives Endoc:  No polyuria, polydipsia , polyphagia or weight change Other:  All other systems  negative  Allergies:  Amoxicillin; Asa; and Levaquin  Physical Examination:  VS: BP 152/70 mmHg  Pulse 98  Temp(Src) 97.6 F (36.4 C) (Oral)  Ht 5' (1.524 m)  Wt 156 lb (70.761 kg)  BMI 30.47 kg/m2  SpO2 94%  General Appearance: No distress  HEENT: PERRLA, no ptosis, no other lesions noticed, moderate erythema of the posterior pharynx Pulmonary:coarse upper airway sounds, no wheezes, no stridor, no rales\ronchi, good airway movement.  Cardiovascular:  Normal S1,S2.  No m/r/g.     Abdomen:Exam: Benign, Soft, non-tender, No masses  Skin:   warm, no rashes, no ecchymosis  Extremities: normal, no cyanosis, clubbing, warm with normal capillary refill.     Assessment and Plan:  74 year old female with chronic sinusitis, asthma/COPD seen in follow-up visit today. Sinusitis, acute I believe that today the patient's symptoms are most system from acute on chronic sinusitis. At this time will treat conservatively with only biotics and now with steroids unless her symptoms are not improving with steroids. This was discussed with the patient, and she is in agreement.  Plan: -Augmentin XR, 1 tab twice a day for 10 days, if no improvement after 4 days will consider starting a prednisone prescription. -Continue with allergy and asthma/COPD regiment. -Avoid triggers.  Asthma, chronic Patient with known asthma, currently Advair 250/50. Unknown etiology of her current asthma triggers, she has had the need for steroids at least 4 times in the past year. 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 Today, I believe the majority of her symptoms are worse due to an acute on chronic sinusitis.   Plan: -Continue control of allergic rhinitis/nasal congestion with as needed nasal steroid and allergy control. -previous absolute eosinophil  count 0.9, while this is elevated before starting patient on another steroid dose will try to optimize her current underlying lung disease with both Advair and Spiriva.  Today will repeat CBC with differential, IgE level, absolute eosinophil count. -Continue with Advair 250/50.  Patient educated on the use, administration, side effects, and proper technique of this medication -Continue with Spiriva  -continue with Prilosec 20 mg one tab by mouth twice a day.       Cough Currently resolving well, still with a mild "tickle" in throat. See plan for asthma and emphysema and acute sinusitis.      Updated Medication List Outpatient Encounter Prescriptions as of 08/13/2014  Medication Sig  . albuterol (PROVENTIL) (2.5 MG/3ML) 0.083% nebulizer solution Take 2.5 mg by nebulization every 6 (six) hours as needed for wheezing or shortness of breath.  . Albuterol Sulfate (PROAIR RESPICLICK) 518 (90 BASE) MCG/ACT AEPB Inhale 2 puffs into the lungs every 6 (six) hours as needed.  . Calcium Carbonate-Vitamin D 600-400 MG-UNIT per tablet Take 1 tablet by mouth 2 (two) times daily.  . Cholecalciferol (VITAMIN D3) 2000 UNITS capsule Take 1 capsule by mouth daily.  . Fluticasone-Salmeterol (ADVAIR) 250-50 MCG/DOSE AEPB Inhale 1 puff into the lungs 2 (two) times daily.  Marland Kitchen ibuprofen (ADVIL,MOTRIN) 200 MG tablet Take 400 mg by mouth as needed.   Marland Kitchen LACTOBACILLUS ACID-PECTIN PO Take 1 capsule by mouth daily.  Marland Kitchen loratadine (CLARITIN) 10 MG tablet Take 10 mg by mouth daily.  Marland Kitchen losartan-hydrochlorothiazide (HYZAAR) 50-12.5 MG per tablet Take 1 tablet by mouth daily.  Marland Kitchen omeprazole (PRILOSEC OTC) 20 MG tablet Take 20 mg by mouth daily.   . Tiotropium Bromide Monohydrate (SPIRIVA RESPIMAT) 1.25 MCG/ACT AERS Inhale 2 puffs into the lungs daily.  . [DISCONTINUED] predniSONE (DELTASONE) 10 MG tablet 4 x 3 days, 3 x 3 days, 2 x 3 days, 1 x 3 days then stop (Patient not taking: Reported on 08/13/2014)   No  facility-administered encounter medications on file as of 08/13/2014.    Orders for this visit: No orders of the defined types were placed in this encounter.    Thank  you for the visitation and for allowing  [facility] to assist in the care of your patient. Our recommendations are noted above.  Please contact us if we can be of further service.  Vilinda Boehringer, MD [facility] Office Number: [phone]

## 2014-08-13 NOTE — Assessment & Plan Note (Signed)
Patient with known asthma, currently Advair 250/50. Unknown etiology of her current asthma triggers, she has had the need for steroids at least 4 times in the past year. 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 Today, I believe the majority of her symptoms are worse due to an acute on chronic sinusitis.   Plan: -Continue control of allergic rhinitis/nasal congestion with as needed nasal steroid and allergy control. -previous absolute eosinophil count 0.9, while this is elevated before starting patient on another steroid dose will try to optimize her current underlying lung disease with both Advair and Spiriva.  Today will repeat CBC with differential, IgE level, absolute eosinophil count. -Continue with Advair 250/50.  Patient educated on the use, administration, side effects, and proper technique of this medication -Continue with Spiriva  -continue with Prilosec 20 mg one tab by mouth twice a day.

## 2014-08-13 NOTE — Assessment & Plan Note (Signed)
I believe that today the patient's symptoms are most system from acute on chronic sinusitis. At this time will treat conservatively with only biotics and now with steroids unless her symptoms are not improving with steroids. This was discussed with the patient, and she is in agreement.  Plan: -Augmentin XR, 1 tab twice a day for 10 days, if no improvement after 4 days will consider starting a prednisone prescription. -Continue with allergy and asthma/COPD regiment. -Avoid triggers.

## 2014-08-13 NOTE — Addendum Note (Signed)
Addended by: Devona Konig on: 08/13/2014 02:54 PM   Modules accepted: Orders

## 2014-08-13 NOTE — Patient Instructions (Signed)
Follow up with Dr. Stevenson Clinch  In 3 months.  - Augmentin XR - 1 tab by mouth, twice a day for 10 days - CBC with differential, IgE level, absolute eosinophil count-obtain these labs before starting anti-biotics -If no improvement after 4 days about a biotics, then call office, and we may consider starting prednisone for a short period. -Continue with allergy regiment, continue with COPD/asthma regiment.

## 2014-08-13 NOTE — Assessment & Plan Note (Signed)
Currently resolving well, still with a mild "tickle" in throat. See plan for asthma and emphysema and acute sinusitis.

## 2014-08-14 ENCOUNTER — Telehealth: Payer: Self-pay | Admitting: Internal Medicine

## 2014-08-14 MED ORDER — CLARITHROMYCIN 500 MG PO TABS: 500.0000 mg | ORAL_TABLET | Freq: Two times a day (BID) | ORAL | Status: DC

## 2014-08-14 NOTE — Telephone Encounter (Signed)
Spoke with pt and advised of lab results and recs per Dr Stevenson Clinch

## 2014-08-14 NOTE — Telephone Encounter (Signed)
We can try Biaxin 500mg  - 1 tab po BID x 10 days.

## 2014-08-14 NOTE — Telephone Encounter (Signed)
Patient can start antibiotic. Her labs were within normal limits.

## 2014-08-14 NOTE — Telephone Encounter (Signed)
Called patient, she says that she has taken Biaxin before without any complications. Rx sent to pharmacy.  Patient requesting lab results.  Advised patient that labs are still pending. Patient wants to know if she should go ahead and start the antibiotics now or await lab results before taking antibiotics.  VM - please advise.

## 2014-08-14 NOTE — Telephone Encounter (Signed)
Patient saw Dr. Stevenson Clinch yesterday, he was going to call in prescription for Augmentin, patient looked it up online and it said that Augmentin has Amoxicillin in it.  Patient is allergic to Amoxicillin and Levaquin and she needs a different antibiotic called in.   Allergies  Allergen Reactions  . Amoxicillin Hives  . Asa [Aspirin] Hives  . Levaquin [Levofloxacin In D5w] Hives   VM - please advise.

## 2014-08-15 ENCOUNTER — Telehealth: Payer: Self-pay | Admitting: Internal Medicine

## 2014-08-15 LAB — ALLERGEN PANEL (27) + IGE
Alternaria Alternata IgE: <0.1 kU/L
Aspergillus Fumigatus IgE: <0.1 kU/L
Bahia Grass IgE: <0.1 kU/L
Bermuda Grass IgE: <0.1 kU/L
Cat Dander IgE: <0.1 kU/L
Cedar, Mountain IgE: <0.1 kU/L
Cladosporium Herbarum IgE: <0.1 kU/L
Cocklebur IgE: <0.1 kU/L
Cockroach, American IgE: <0.1 kU/L
Common Silver Birch IgE: <0.1 kU/L
D Farinae IgE: <0.1 kU/L
D Pteronyssinus IgE: <0.1 kU/L
Dog Dander IgE: <0.1 kU/L
Elm, American IgE: <0.1 kU/L
Hickory, White IgE: <0.1 kU/L
IgE (Immunoglobulin E), Serum: 58 IU/mL (ref 0–100)
Johnson Grass IgE: <0.1 kU/L
Kentucky Bluegrass IgE: <0.1 kU/L
Maple/Box Elder IgE: <0.1 kU/L
Mucor Racemosus IgE: <0.1 kU/L
Oak, White IgE: <0.1 kU/L
Penicillium Chrysogen IgE: <0.1 kU/L
Pigweed, Rough IgE: <0.1 kU/L
Plantain, English IgE: <0.1 kU/L
Ragweed, Short IgE: <0.1 kU/L
Setomelanomma Rostrat: <0.1 kU/L
Timothy Grass IgE: <0.1 kU/L
White Mulberry IgE: <0.1 kU/L

## 2014-08-15 NOTE — Telephone Encounter (Signed)
Pt is wanting the Prednisone to be called in ahead of time to have on hand in case she does need to start it 3-4 days in to abx. Pt is going out of town x 1 week on Monday July 4th to [place] and will not be here to pick up Prednisone if needed. Wants to know if this can just be called in and she can take it if needed.  Please advise Dr Stevenson Clinch. Thanks.

## 2014-08-15 NOTE — Telephone Encounter (Signed)
Per 6.28.16 phone note: rx for Biaxin was sent electronically to [facility], spoke with Velna Hatchet who verified receipt of rx but that there is a drug interaction b/t the Biaxin and Advair > this was faxed to the office on 6/28 per Brandi.  Requested Brandi please send fax again to check the interaction.  Fax received   Called spoke with patient and discussed the above.  Pt voiced her understanding and reported she is aware of the possible drug interaction between the Biaxin and Advair (was informed by pharmacy per pt) but she has taken Biaxin in the past while on the Advair with no issues.  Advised pt that we would need authorization from VM for this.  "Pharmacologic effects of Advair Diskis Inhalation may be increased by Clarithromycin Oral.  Elevated salmeterol plasma concentrations with cardiovascular toxicity, such as QT prolongation, palpitations, and tachycardia may occur."  Also, pt stated that she was told that if the abx therapy is ineffective then prednisone would be next course of action.  Pt stated she is leaving for [place] on 7/5 and would like to know if VM would go ahead and send prednisone to take with her in case it is needed while she is out of town.    Dr Stevenson Clinch please advise on the Biaxin and prednisone taper.  Thank you.  Patient Instructions     Follow up with Dr. Stevenson Clinch In 3 months.  - Augmentin XR - 1 tab by mouth, twice a day for 10 days - CBC with differential, IgE level, absolute eosinophil count-obtain these labs before starting anti-biotics -If no improvement after 4 days about a biotics, then call office, and we may consider starting prednisone for a short period. -Continue with allergy regiment, continue with COPD/asthma regiment.

## 2014-08-15 NOTE — Telephone Encounter (Signed)
FYI  I have called and spoke with patient. Advised her that prednisone will be called if no improvement after antibiotics (3-4 days).  She uses [facility] as her pharmacy, and will be visiting daughter in [place]; we can call in prednisone (20mg  x 5 days) to local [facility] if no improvement after antibiotic initiation  Patient in agreement with above plan.    -VM

## 2014-08-15 NOTE — Telephone Encounter (Signed)
If patient has used Biaxin and advair in the past without any serious adverse affects, then she may use these meds again.  Advised patient to try antibiotics first for 3-4 days, if no improvement, then we can call in a prescription for prednisone.

## 2014-08-15 NOTE — Telephone Encounter (Signed)
Spoke with pt. She has taken this ABX in the past w/o any complications Also aware of Dr. Merian Capron recs below [facility] and [facility] with [facility]. Gave VO to fill ABX. Nothing further needed

## 2014-08-15 NOTE — Telephone Encounter (Signed)
I prefer not to give out standing orders for steroids. Per our records, patient uses [facility] as her pharmacy. If needed we can call in an rx to a [facility] in her area.

## 2014-08-22 ENCOUNTER — Telehealth: Payer: Self-pay | Admitting: Internal Medicine

## 2014-08-22 MED ORDER — PREDNISONE 20 MG PO TABS: 20.0000 mg | ORAL_TABLET | Freq: Every day | ORAL | Status: DC

## 2014-08-22 NOTE — Telephone Encounter (Signed)
Prednisone 20mg  x 5 days, take with breakfast.  No refills.

## 2014-08-22 NOTE — Telephone Encounter (Signed)
Patient saw Dr. Stevenson Clinch last week, was given antibiotic.  She has gotten a little better, but she has a lot of congestion in her head.  Patient says that at her last OV, Dr. Stevenson Clinch says that if she is not better, he would call her in Prednisone.  Patient is on vacation in [place], [place] and would like the medication called into the [facility] - in [place], [place].  [phone]   Allergies  Allergen Reactions  . Amoxicillin Hives  . Asa [Aspirin] Hives  . Levaquin [Levofloxacin In D5w] Hives   VM - please advise.

## 2014-08-22 NOTE — Telephone Encounter (Signed)
Called pt and is aware of recs. RX sent in. Nothing further needed 

## 2014-10-03 DIAGNOSIS — H6121 Impacted cerumen, right ear: Secondary | ICD-10-CM | POA: Diagnosis not present

## 2014-10-03 DIAGNOSIS — J330 Polyp of nasal cavity: Secondary | ICD-10-CM | POA: Diagnosis not present

## 2014-10-03 DIAGNOSIS — J324 Chronic pansinusitis: Secondary | ICD-10-CM | POA: Diagnosis not present

## 2014-10-10 DIAGNOSIS — J330 Polyp of nasal cavity: Secondary | ICD-10-CM | POA: Diagnosis not present

## 2014-10-10 DIAGNOSIS — J45991 Cough variant asthma: Secondary | ICD-10-CM | POA: Diagnosis not present

## 2014-10-18 ENCOUNTER — Telehealth: Payer: Self-pay | Admitting: *Deleted

## 2014-10-18 DIAGNOSIS — R05 Cough: Secondary | ICD-10-CM

## 2014-10-18 DIAGNOSIS — J45909 Unspecified asthma, uncomplicated: Secondary | ICD-10-CM

## 2014-10-18 DIAGNOSIS — R059 Cough, unspecified: Secondary | ICD-10-CM

## 2014-10-18 NOTE — Telephone Encounter (Signed)
Pt given Incentive Spirometry.

## 2015-01-09 ENCOUNTER — Encounter: Payer: Self-pay | Admitting: Emergency Medicine

## 2015-01-09 ENCOUNTER — Ambulatory Visit
Admission: EM | Admit: 2015-01-09 | Discharge: 2015-01-09 | Disposition: A | Discharge status: Home / Self Care | Payer: Medicare Other | Attending: Family Medicine | Admitting: Family Medicine

## 2015-01-09 DIAGNOSIS — J040 Acute laryngitis: Secondary | ICD-10-CM

## 2015-01-09 DIAGNOSIS — J441 Chronic obstructive pulmonary disease with (acute) exacerbation: Secondary | ICD-10-CM

## 2015-01-09 MED ORDER — PREDNISONE 10 MG (21) PO TBPK: ORAL_TABLET | ORAL | Status: DC

## 2015-01-09 MED ORDER — AZITHROMYCIN 250 MG PO TABS: ORAL_TABLET | ORAL | Status: DC

## 2015-01-09 NOTE — ED Provider Notes (Signed)
CSN: [id number]     Arrival date & time 01/09/15  0747 History   First MD Initiated Contact with Patient 01/09/15 [phone]    Nurses notes were reviewed. Chief Complaint  Patient presents with  . Hoarse    She reports being hoarse for about 2 days. She states for about 3 or 4 days she's been having postnasal drainage and asthma exacerbation. She reports increased wheezing. She has history COPD sick to exposure from which smoke she was exposed to as a little girl growing up with a father who smoked heavily. She reports that neither her or her husband smokes. She states that she has exacerbation of her COPD 2-4 times a year. She has a nebulizer at home and albuterol inhaler as well. When she takes prednisone usually a 6 day course does not help and she needs a 12 day course.  (Consider location/radiation/quality/duration/timing/severity/associated sxs/prior Treatment) Patient is a 74 y.o. female presenting with cough. The history is provided by the patient. No language interpreter was used.  Cough Cough characteristics:  Non-productive Severity:  Moderate Onset quality:  Sudden Timing:  Constant Progression:  Waxing and waning Chronicity:  New Smoker: no   Context: upper respiratory infection   Context: not smoke exposure and not weather changes   Relieved by:  Nothing Worsened by:  Deep breathing Ineffective treatments:  Ipratropium inhaler and beta-agonist inhaler Associated symptoms: shortness of breath and sinus congestion   Associated symptoms: no chest pain, no chills, no ear pain, no eye discharge, no fever, no myalgias, no rash, no rhinorrhea and no sore throat   Risk factors: recent infection     Past Medical History  Diagnosis Date  . Asthma   . Cough variant asthma   . HTN (hypertension)   . Diabetes ([place])   . Dyslipidemia    Past Surgical History  Procedure Laterality Date  . Primary open reduction procedure  11/09/2013    fragment of bone and fixation using screws    History reviewed. No pertinent family history. Social History  Substance Use Topics  . Smoking status: Never Smoker   . Smokeless tobacco: Never Used  . Alcohol Use: No   OB History    No data available     Review of Systems  Constitutional: Negative for fever and chills.  HENT: Negative for ear pain, rhinorrhea and sore throat.   Eyes: Negative for discharge.  Respiratory: Positive for cough and shortness of breath.   Cardiovascular: Negative for chest pain.  Musculoskeletal: Negative for myalgias.  Skin: Negative for rash.  All other systems reviewed and are negative.   Allergies  Sulfa antibiotics; Amoxicillin; Asa; and Levaquin  Home Medications   Prior to Admission medications   Medication Sig Start Date End Date Taking? Authorizing Provider  albuterol (PROVENTIL) (2.5 MG/3ML) 0.083% nebulizer solution Take 2.5 mg by nebulization every 6 (six) hours as needed for wheezing or shortness of breath.   Yes Historical Provider, MD  Calcium Carbonate-Vitamin D 600-400 MG-UNIT per tablet Take 1 tablet by mouth 2 (two) times daily.   Yes Historical Provider, MD  Fluticasone-Salmeterol (ADVAIR) 250-50 MCG/DOSE AEPB Inhale 1 puff into the lungs 2 (two) times daily.   Yes Historical Provider, MD  loratadine (CLARITIN) 10 MG tablet Take 10 mg by mouth daily.   Yes Historical Provider, MD  losartan-hydrochlorothiazide (HYZAAR) 50-12.5 MG per tablet Take 1 tablet by mouth daily. 02/26/14  Yes Historical Provider, MD  omeprazole (PRILOSEC OTC) 20 MG tablet Take 20 mg by mouth  daily.    Yes Historical Provider, MD  Albuterol Sulfate (PROAIR RESPICLICK) 123XX123 (90 BASE) MCG/ACT AEPB Inhale 2 puffs into the lungs every 6 (six) hours as needed. 05/16/14   Vishal Mungal, MD  azithromycin (ZITHROMAX Z-PAK) 250 MG tablet Take 2 tablets first day and then 1 po a day for 4 days 01/09/15   Frederich Cha, MD  Cholecalciferol (VITAMIN D3) 2000 UNITS capsule Take 1 capsule by mouth daily.    Historical  Provider, MD  clarithromycin (BIAXIN) 500 MG tablet Take 1 tablet (500 mg total) by mouth 2 (two) times daily. 08/14/14   Vishal Mungal, MD  ibuprofen (ADVIL,MOTRIN) 200 MG tablet Take 400 mg by mouth as needed.     Historical Provider, MD  LACTOBACILLUS ACID-PECTIN PO Take 1 capsule by mouth daily.    Historical Provider, MD  predniSONE (DELTASONE) 20 MG tablet Take 1 tablet (20 mg total) by mouth daily with breakfast. 08/22/14   Vishal Mungal, MD  predniSONE (STERAPRED UNI-PAK 21 TAB) 10 MG (21) TBPK tablet 6 tabs day 1 and 2, 5 tabs day 3 and 4, 4 tabs day 5 and 6, 3 tabs day 7 and 8, 2 tabs day 9 and 10, 1 tab day 11 and 12. Take orally 01/09/15   Frederich Cha, MD  Tiotropium Bromide Monohydrate (SPIRIVA RESPIMAT) 1.25 MCG/ACT AERS Inhale 2 puffs into the lungs daily. 05/08/14   Vilinda Boehringer, MD   Meds Ordered and Administered this Visit  Medications - No data to display  BP 145/59 mmHg  Pulse 81  Temp(Src) 97.6 F (36.4 C)  Resp 18  Ht 5\' 3"  (1.6 m)  Wt 158 lb (71.668 kg)  BMI 28.00 kg/m2  SpO2 99% No data found.   Physical Exam  Constitutional: She is oriented to person, place, and time. She appears well-developed and well-nourished.  HENT:  Head: Normocephalic and atraumatic.  Right Ear: External ear normal.  Left Ear: External ear normal.  Nose: No rhinorrhea.  Mouth/Throat: Normal dentition. No dental caries.  Patient markedly hoarse.  Eyes: Conjunctivae are normal. Pupils are equal, round, and reactive to light.  Neck: Normal range of motion. Neck supple. No thyromegaly present.  Cardiovascular: Normal rate and regular rhythm.   Pulmonary/Chest: Effort normal. No respiratory distress. She has wheezes.  Neurological: She is alert and oriented to person, place, and time.  Skin: Skin is warm. No erythema.  Psychiatric: She has a normal mood and affect.  Vitals reviewed.   ED Course  Procedures (including critical care time)  Labs Review Labs Reviewed - No data to  display  Imaging Review No results found.   Visual Acuity Review  Right Eye Distance:   Left Eye Distance:   Bilateral Distance:    Right Eye Near:   Left Eye Near:    Bilateral Near:         MDM   1. COPD with acute exacerbation ([place])   2. Laryngitis, acute     Discussed patient options she states that still the six-day course of prednisone she is required to attend 12 days course of prednisone. She is use Biaxin before which I am not a big fan of so we will use a Z-Pak instead. Follow-up with PCP or or pulmonologist not better in a week.     Frederich Cha, MD 01/09/15 [phone]

## 2015-01-09 NOTE — ED Notes (Signed)
Patient states she developed a hoarse voice yesterday, has sinus pressure and shortness of breath which she says is a "continualy thing" but is more so today.

## 2015-01-09 NOTE — Discharge Instructions (Signed)
Chronic Obstructive Pulmonary Disease Exacerbation Chronic obstructive pulmonary disease (COPD) is a common lung problem. In COPD, the flow of air from the lungs is limited. COPD exacerbations are times that breathing gets worse and you need extra treatment. Without treatment they can be life threatening. If they happen often, your lungs can become more damaged. If your COPD gets worse, your doctor may treat you with:  Medicines.  Oxygen.  Different ways to clear your airway, such as using a mask. HOME CARE  Do not smoke.  Avoid tobacco smoke and other things that bother your lungs.  If given, take your antibiotic medicine as told. Finish the medicine even if you start to feel better.  Only take medicines as told by your doctor.  Drink enough fluids to keep your pee (urine) clear or pale yellow (unless your doctor has told you not to).  Use a cool mist machine (vaporizer).  If you use oxygen or a machine that turns liquid medicine into a mist (nebulizer), continue to use them as told.  Keep up with shots (vaccinations) as told by your doctor.  Exercise regularly.  Eat healthy foods.  Keep all doctor visits as told. GET HELP RIGHT AWAY IF:  You are very short of breath and it gets worse.  You have trouble talking.  You have bad chest pain.  You have blood in your spit (sputum).  You have a fever.  You keep throwing up (vomiting).  You feel weak, or you pass out (faint).  You feel confused.  You keep getting worse. MAKE SURE YOU:  Understand these instructions.  Will watch your condition.  Will get help right away if you are not doing well or get worse.   This information is not intended to replace advice given to you by your health care provider. Make sure you discuss any questions you have with your health care provider.   Document Released: 01/22/2011 Document Revised: 02/23/2014 Document Reviewed: 10/07/2012 Elsevier Interactive Patient Education 2016  Elsevier Inc.  Cough, Adult A cough helps to clear your throat and lungs. A cough may last only 2-3 weeks (acute), or it may last longer than 8 weeks (chronic). Many different things can cause a cough. A cough may be a sign of an illness or another medical condition. HOME CARE  Pay attention to any changes in your cough.  Take medicines only as told by your doctor.  If you were prescribed an antibiotic medicine, take it as told by your doctor. Do not stop taking it even if you start to feel better.  Talk with your doctor before you try using a cough medicine.  Drink enough fluid to keep your pee (urine) clear or pale yellow.  If the air is dry, use a cold steam vaporizer or humidifier in your home.  Stay away from things that make you cough at work or at home.  If your cough is worse at night, try using extra pillows to raise your head up higher while you sleep.  Do not smoke, and try not to be around smoke. If you need help quitting, ask your doctor.  Do not have caffeine.  Do not drink alcohol.  Rest as needed. GET HELP IF:  You have new problems (symptoms).  You cough up yellow fluid (pus).  Your cough does not get better after 2-3 weeks, or your cough gets worse.  Medicine does not help your cough and you are not sleeping well.  You have pain that gets worse  or pain that is not helped with medicine.  You have a fever.  You are losing weight and you do not know why.  You have night sweats. GET HELP RIGHT AWAY IF:  You cough up blood.  You have trouble breathing.  Your heartbeat is very fast.   This information is not intended to replace advice given to you by your health care provider. Make sure you discuss any questions you have with your health care provider.   Document Released: 10/16/2010 Document Revised: 10/24/2014 Document Reviewed: 04/11/2014 Elsevier Interactive Patient Education 2016 Elsevier Inc.  Laryngitis Laryngitis is swelling  (inflammation) of your vocal cords. This causes hoarseness, coughing, loss of voice, sore throat, or a dry throat. When your vocal cords are inflamed, your voice sounds different. Laryngitis can be temporary (acute) or long-term (chronic). Most cases of acute laryngitis improve with time. Chronic laryngitis is laryngitis that lasts for more than three weeks. HOME CARE  Drink enough fluid to keep your pee (urine) clear or pale yellow.  Breathe in moist air. Use a humidifier if you live in a dry climate.  Take medicines only as told by your doctor.  Do not smoke cigarettes or electronic cigarettes. If you need help quitting, ask your doctor.  Talk as little as possible. Also avoid whispering, which can cause vocal strain.  Write instead of talking. Do this until your voice is back to normal. GET HELP IF:  You have a fever.  Your pain is worse.  You have trouble swallowing. GET HELP RIGHT AWAY IF:  You cough up blood.  You have trouble breathing.   This information is not intended to replace advice given to you by your health care provider. Make sure you discuss any questions you have with your health care provider.   Document Released: 01/22/2011 Document Revised: 02/23/2014 Document Reviewed: 07/18/2013 Elsevier Interactive Patient Education 2016 Elsevier Inc.   Upper Respiratory Infection, Adult Most upper respiratory infections (URIs) are caused by a virus. A URI affects the nose, throat, and upper air passages. The most common type of URI is often called "the common cold." HOME CARE   Take medicines only as told by your doctor.  Gargle warm saltwater or take cough drops to comfort your throat as told by your doctor.  Use a warm mist humidifier or inhale steam from a shower to increase air moisture. This may make it easier to breathe.  Drink enough fluid to keep your pee (urine) clear or pale yellow.  Eat soups and other clear broths.  Have a healthy diet.  Rest as  needed.  Go back to work when your fever is gone or your doctor says it is okay.  You may need to stay home longer to avoid giving your URI to others.  You can also wear a face mask and wash your hands often to prevent spread of the virus.  Use your inhaler more if you have asthma.  Do not use any tobacco products, including cigarettes, chewing tobacco, or electronic cigarettes. If you need help quitting, ask your doctor. GET HELP IF:  You are getting worse, not better.  Your symptoms are not helped by medicine.  You have chills.  You are getting more short of breath.  You have brown or red mucus.  You have yellow or brown discharge from your nose.  You have pain in your face, especially when you bend forward.  You have a fever.  You have puffy (swollen) neck glands.  You  have pain while swallowing.  You have white areas in the back of your throat. GET HELP RIGHT AWAY IF:   You have very bad or constant:  Headache.  Ear pain.  Pain in your forehead, behind your eyes, and over your cheekbones (sinus pain).  Chest pain.  You have long-lasting (chronic) lung disease and any of the following:  Wheezing.  Long-lasting cough.  Coughing up blood.  A change in your usual mucus.  You have a stiff neck.  You have changes in your:  Vision.  Hearing.  Thinking.  Mood. MAKE SURE YOU:   Understand these instructions.  Will watch your condition.  Will get help right away if you are not doing well or get worse.   This information is not intended to replace advice given to you by your health care provider. Make sure you discuss any questions you have with your health care provider.   Document Released: 07/22/2007 Document Revised: 06/19/2014 Document Reviewed: 05/10/2013 Elsevier Interactive Patient Education [employer].

## 2015-01-28 ENCOUNTER — Ambulatory Visit (INDEPENDENT_AMBULATORY_CARE_PROVIDER_SITE_OTHER): Payer: Medicare Other | Admitting: Pulmonary Disease

## 2015-01-28 ENCOUNTER — Encounter: Payer: Self-pay | Admitting: Pulmonary Disease

## 2015-01-28 VITALS — BP 162/84 | HR 90 | Ht 61.0 in | Wt 161.2 lb

## 2015-01-28 DIAGNOSIS — J387 Other diseases of larynx: Secondary | ICD-10-CM | POA: Diagnosis not present

## 2015-01-28 DIAGNOSIS — J040 Acute laryngitis: Secondary | ICD-10-CM

## 2015-01-28 DIAGNOSIS — R490 Dysphonia: Secondary | ICD-10-CM

## 2015-01-28 DIAGNOSIS — K219 Gastro-esophageal reflux disease without esophagitis: Secondary | ICD-10-CM

## 2015-01-28 DIAGNOSIS — J449 Chronic obstructive pulmonary disease, unspecified: Secondary | ICD-10-CM

## 2015-01-28 NOTE — Progress Notes (Signed)
ACUTE PULMONARY OFFICE VISIT  ACUTE PROBLEM:  CHRONIC PULMONARY PROBLEMS: Mild COPD/asthma  SUBJ: Pt of Dr Stevenson Clinch Seen as acute visit with 3 weeks of URI symptoms. Initially she had posterior nasal drainage, ST, cough with yellow mucus, hoarseness. She was treated at the urgent care center with a Zpak and course of prednisone. Post nasal drainage, cough, and wheezing have improved but hoarseness and ST have persisted. She has no fevers, dyspnea, CP, hemoptysis.   OBJ: Filed Vitals:   01/28/15 1203  BP: 162/84  Pulse: 90  Height: 5\' 1"  (1.549 m)  Weight: 161 lb 3.2 oz (73.12 kg)  SpO2: 97%    Gen: WDWN in NAD HEENT:hoarse voice quality, moderate pharyngeal erythema, no exudates Neck: NO LAN, no JVD noted Lungs: full BS, normal percussion note throughout, no wheezes Cardiovascular: Reg rate, normal rhythm, no M noted Abdomen: Soft, NT +BS Ext: no C/C/E Neuro: CNs intact, motor/sens grossly intact Skin: No lesions noted   IMPRESSION: 1) Hoarseness/laryngitis - the onset of this suggests an infectious etiology (likely viral) but its persistence suggests a more chronic problem such as LPRD (laryngopharyngeal reflux disease). She does not have significant heartburn but does have a HH and takes OTC Omeprazole daily. It is possible that the Advair is contributing to this as well but she has not had problems tolerating this medication previously  2) Chronic obstructive pulmonary disease - mild obstruction. No exacerbation of symptoms presently    PLAN:   Double omeprazole to BID until her symptoms resolve ROV with Dr Stevenson Clinch in 4 weeks or next available She is to contact us sooner if her symptoms do not resolve If symptoms do not resolve or are relapsing, would consider trial off of inhaled steroids   I have reviewed this patient's medical problems, current medications and therapies and prior pulmonary office notes in evaluation and formulation of the above assessment and  plan    Wilhelmina Mcardle, MD [facility]

## 2015-01-28 NOTE — Patient Instructions (Signed)
Double Prilosec to twice a day until your symptoms resolve Follow up with Dr Stevenson Clinch in 3-4 weeks

## 2015-02-28 ENCOUNTER — Other Ambulatory Visit: Payer: Self-pay | Admitting: Physician Assistant

## 2015-02-28 DIAGNOSIS — J441 Chronic obstructive pulmonary disease with (acute) exacerbation: Secondary | ICD-10-CM | POA: Diagnosis not present

## 2015-02-28 DIAGNOSIS — Z1239 Encounter for other screening for malignant neoplasm of breast: Secondary | ICD-10-CM | POA: Diagnosis not present

## 2015-02-28 DIAGNOSIS — I10 Essential (primary) hypertension: Secondary | ICD-10-CM | POA: Diagnosis not present

## 2015-02-28 DIAGNOSIS — K449 Diaphragmatic hernia without obstruction or gangrene: Secondary | ICD-10-CM | POA: Diagnosis not present

## 2015-02-28 DIAGNOSIS — E782 Mixed hyperlipidemia: Secondary | ICD-10-CM | POA: Diagnosis not present

## 2015-02-28 DIAGNOSIS — R946 Abnormal results of thyroid function studies: Secondary | ICD-10-CM | POA: Diagnosis not present

## 2015-02-28 DIAGNOSIS — R739 Hyperglycemia, unspecified: Secondary | ICD-10-CM | POA: Diagnosis not present

## 2015-02-28 DIAGNOSIS — Z1231 Encounter for screening mammogram for malignant neoplasm of breast: Secondary | ICD-10-CM | POA: Diagnosis not present

## 2015-02-28 DIAGNOSIS — J45901 Unspecified asthma with (acute) exacerbation: Secondary | ICD-10-CM | POA: Diagnosis not present

## 2015-02-28 DIAGNOSIS — K219 Gastro-esophageal reflux disease without esophagitis: Secondary | ICD-10-CM | POA: Diagnosis not present

## 2015-03-05 IMAGING — CR DG CHEST 2V
2 series · 2 of 2 positions shown · non-contrast
Comparison: Chest radiographs 08/09/2012 and 06/26/2011.

CLINICAL DATA: Shortness of breath.  Hypoxemia.

EXAM:
CHEST  2 VIEW

[chest pa]
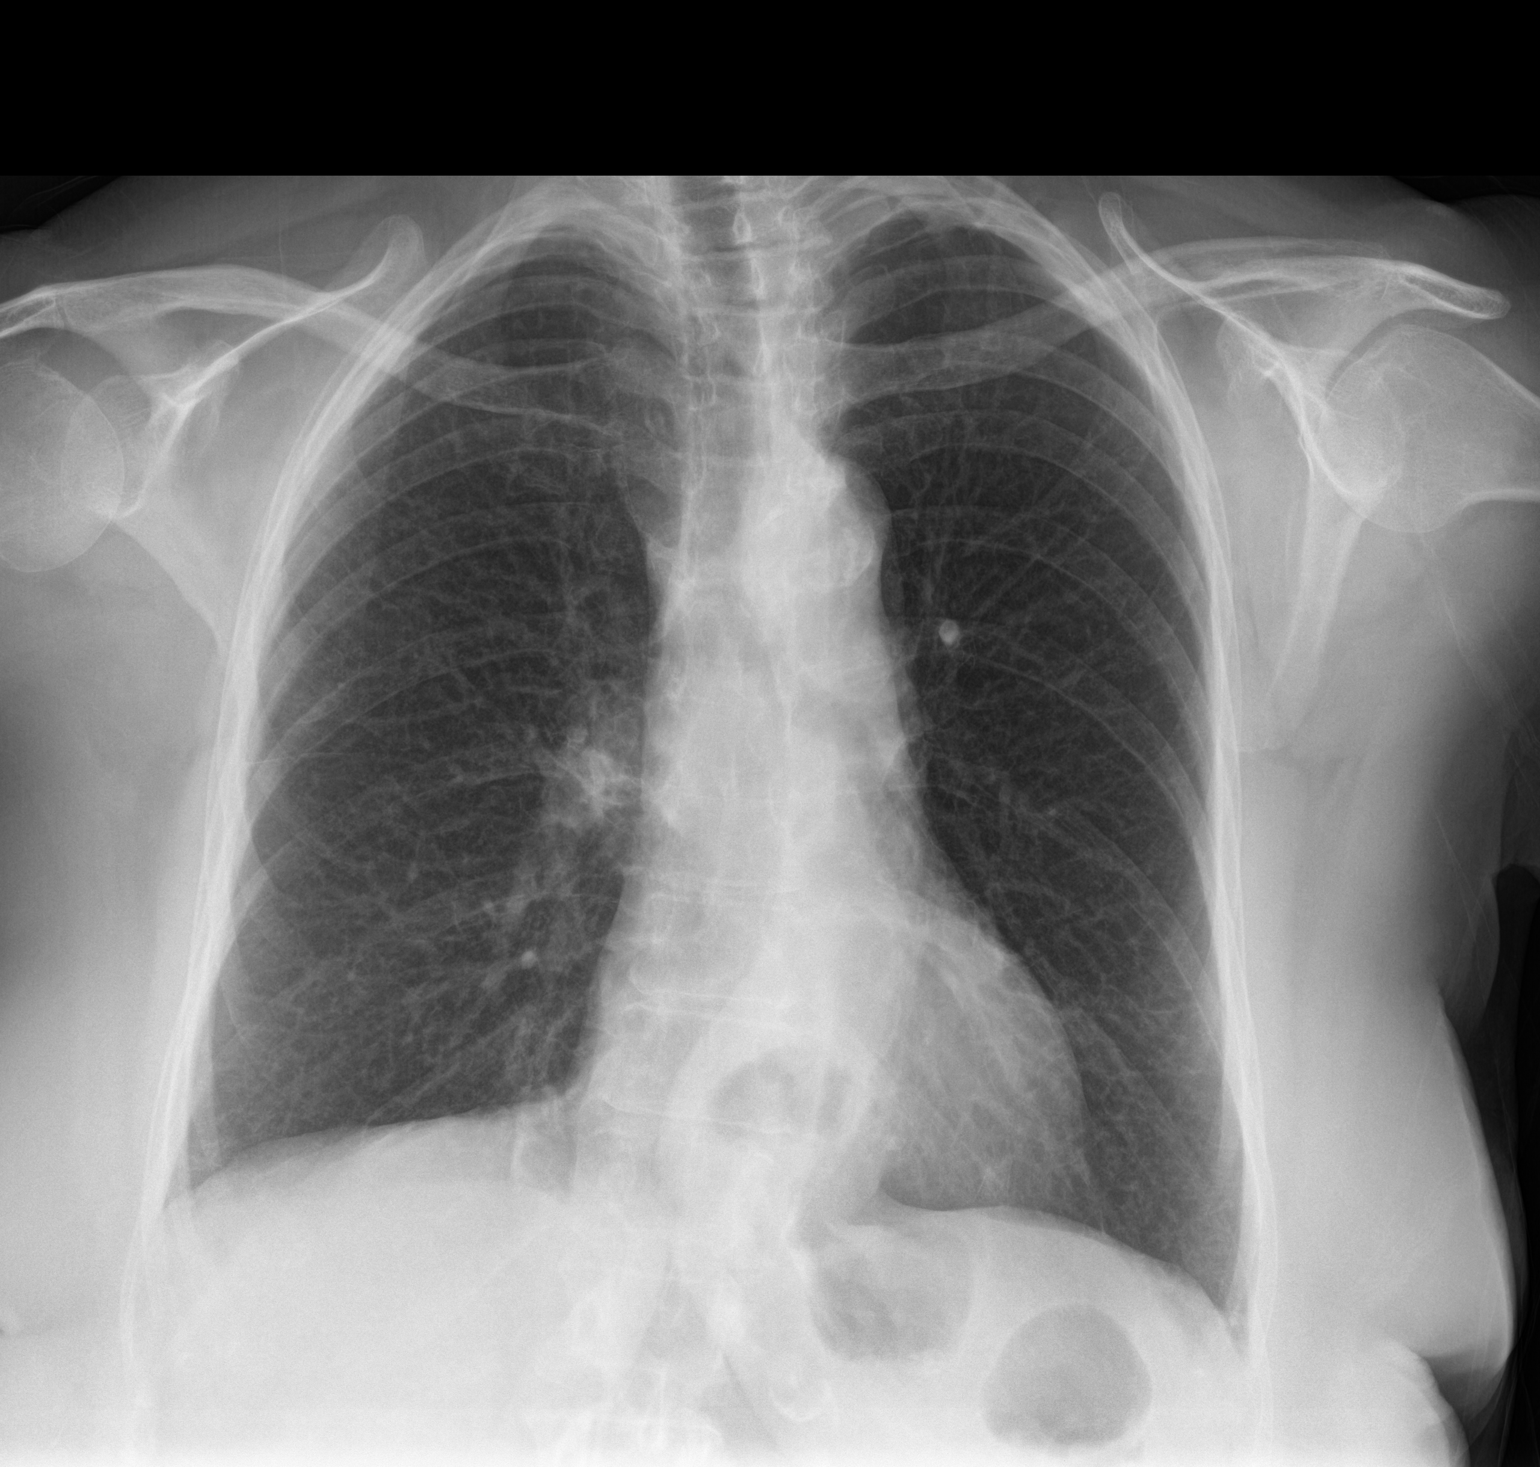

[chest lat]
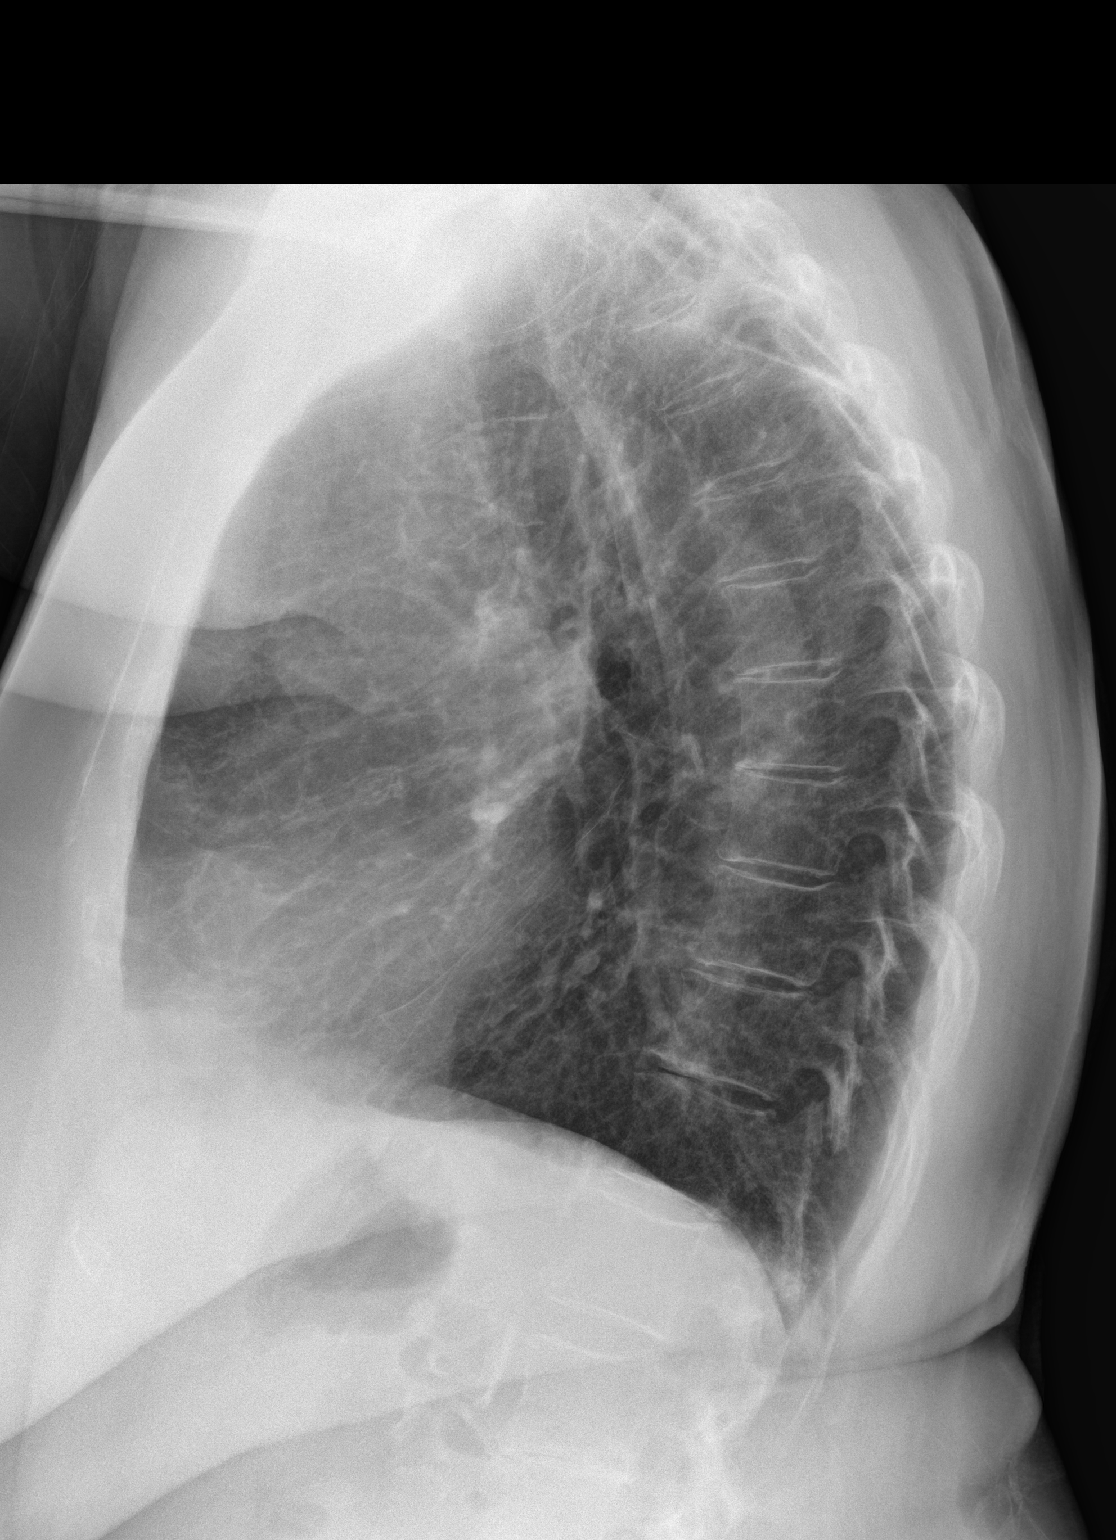

[2 of 2 positions shown; findings below may reference images not displayed]

FINDINGS: The heart size and mediastinal contours are stable. There is aortic
atherosclerosis and a small hiatal hernia. The lungs are clear.
There is no pleural effusion or pneumothorax. Mild degenerative
changes are present within the thoracic spine associated with a mild
convex left scoliosis.
IMPRESSION: Stable chest.  No acute cardiopulmonary process.

## 2015-03-11 ENCOUNTER — Ambulatory Visit: Payer: Medicare Other

## 2015-03-28 ENCOUNTER — Ambulatory Visit (INDEPENDENT_AMBULATORY_CARE_PROVIDER_SITE_OTHER): Payer: Medicare Other | Admitting: Internal Medicine

## 2015-03-28 ENCOUNTER — Encounter: Payer: Self-pay | Admitting: Internal Medicine

## 2015-03-28 VITALS — BP 158/70 | HR 108 | Ht 61.0 in | Wt 162.6 lb

## 2015-03-28 DIAGNOSIS — J4540 Moderate persistent asthma, uncomplicated: Secondary | ICD-10-CM

## 2015-03-28 MED ORDER — FLUTICASONE FUROATE-VILANTEROL 100-25 MCG/INH IN AEPB: 1.0000 | INHALATION_SPRAY | Freq: Every day | RESPIRATORY_TRACT | Status: DC

## 2015-03-28 MED ORDER — FLUTICASONE FUROATE-VILANTEROL 100-25 MCG/INH IN AEPB: 1.0000 | INHALATION_SPRAY | Freq: Every day | RESPIRATORY_TRACT | Status: AC

## 2015-03-28 NOTE — Assessment & Plan Note (Signed)
Patient with known asthma, currently Advair 250/50. 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 Today, she is at baseline breathing. Advair is costing her about $35 per month out of pocket. Still with chronic cough since URI in Dec 2016   Plan: -Continue control of allergic rhinitis/nasal congestion with as needed nasal steroid and allergy control. -BreoEllipta 100/25. 1 puff daily. Gargle and rinse after each use. Sample and coupon given today.  If this is not financially feasible will got back to Adviar 250/50.  Patient educated on the use, administration, side effects, and proper technique of this medication -continue with Prilosec 20 mg one tab by mouth twice a day. -check 2 view CXR for chronic cough.

## 2015-03-28 NOTE — Patient Instructions (Signed)
Follow up with Dr. Stevenson Clinch in:38months - stop Advair for now - BreoEllipta (100/25) - 1 puff daily, gargle and rinse after each use. Coupon and sample given today. - cont with allergy control - cont with exercise as tolerated - we will check a 2 view chest xray given your persistent cough.

## 2015-03-28 NOTE — Progress Notes (Signed)
MRN# [id number] Anita Kent [birth year]   CC: Chief Complaint  Patient presents with  . Follow-up    pt. states breathing is baseline. SOB. occ. prod. cough yellow in color. denies wheezing or chest pain/tightness.       Brief History: 03/06/2014 HPI Patient is a pleasant 75 year old female presents today for a established care visit for optimization of asthma. Patient states that over the past year her asthma has been difficult to control without the use of steroids almost every 3 months. These tapers with ease her symptoms for about 2-3 months until asthma that acting up again, and will be responsive to steroids. She is a Never smoker, but exposure to second hand smoke as a child until 24 yo. Usually has some wheezing about twice a year that clears with steroids prior to 2015. Patient started seen ENT (Dr. Tami Ribas) about 9 years ago for sinus congestion, had allergy testing done (was not a candidate for allergy shot), had nasal septal surgery done, currently on a nasal steroid as needed.  ENT also clinically diagnosed her with Asthma about 9 years ago, she has been on Advair since. Patient has tried [facility] in the past, but did not like the taste and felt it was not providing a benefit, so she continued with Advair.  Two years ago (2014) she was hospitalized for an asthma attack (visitng friends in the [place]), during the spring), admitted for 7 days (received antibiotics and steroids, no intubation). Patient has never been intubated for respiratory issues. Since that asthma attack, she states that she has had frequent episodes of her asthma acting up requiring steroids.  No pets at home. 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 Currently today she states she still having dry cough, and dyspnea on exertion Rarely using albuterol - current inhaler is approaching expiration date.  Overall she states that she is much improved since her last visit with [place]. Today she likes to the results of her labs, CT chest scan, pulmonary function testing, 6 minute walk test. At her last visit STOP BANG was positive.  Plan: -Continue control of allergic rhinitis/nasal congestion with as needed nasal steroid. -absolute eosinophil count 0.9, while this is elevated before starting patient on another steroid dose will try to optimize her current underlying lung disease with both Advair and Spiriva. -Continue with Advair 250/50. Patient educated on the use, administration, side effects, and proper technique of this medication -Spiriva added to the current regiment. -continue with Prilosec 20 mg one tab by mouth twice a day. -Currently on Advair for her asthma will add Spiriva for further optimization of the COPD component for her underlying structural lung disease.  ROV 01/28/15- ACUTE VISIT - Dr. Alva Garnet Seen as acute visit with 3 weeks of URI symptoms. Initially she had posterior nasal drainage, ST, cough with yellow mucus, hoarseness. She was treated at the urgent care center with a Zpak and course of prednisone. Post nasal drainage, cough, and wheezing have improved but hoarseness have persisted. She has no fevers, dyspnea, CP, hemoptysis.  Plan -  Double omeprazole to BID until  her symptoms resolve ROV with Dr Stevenson Clinch in 4 weeks or next available She is to contact us sooner if her symptoms do not resolve If symptoms do not resolve or are relapsing, would consider trial off of inhaled steroids   Events since last clinic visit See above note Today with mild cough (chronic - yellowish  sputum), no wheezing. Back to baseline breathing.  Using Advair daily No sputum in sputum characteristics Dec 2016, had an URI, placed on Zpak and prednisone taper by Urgent Care, now doing well.    Medication:   Current Outpatient Rx  Name  Route  Sig  Dispense  Refill  . albuterol (PROVENTIL) (2.5 MG/3ML) 0.083% nebulizer solution   Nebulization   Take 2.5 mg by nebulization every 6 (six) hours as needed for wheezing or shortness of breath.         . Albuterol Sulfate (PROAIR RESPICLICK) 123XX123 (90 BASE) MCG/ACT AEPB   Inhalation   Inhale 2 puffs into the lungs every 6 (six) hours as needed.   1 each   11   . Calcium Carbonate-Vitamin D 600-400 MG-UNIT per tablet   Oral   Take 1 tablet by mouth 2 (two) times daily.         . Cholecalciferol (VITAMIN D3) 2000 UNITS capsule   Oral   Take 1 capsule by mouth daily.         . clarithromycin (BIAXIN) 500 MG tablet   Oral   Take 1 tablet (500 mg total) by mouth 2 (two) times daily.   20 tablet   0   . Fluticasone-Salmeterol (ADVAIR) 250-50 MCG/DOSE AEPB   Inhalation   Inhale 1 puff into the lungs 2 (two) times daily.         Marland Kitchen ibuprofen (ADVIL,MOTRIN) 200 MG tablet   Oral   Take 400 mg by mouth as needed.          Marland Kitchen LACTOBACILLUS ACID-PECTIN PO   Oral   Take 1 capsule by mouth daily.         Marland Kitchen loratadine (CLARITIN) 10 MG tablet   Oral   Take 10 mg by mouth daily.         Marland Kitchen losartan-hydrochlorothiazide (HYZAAR) 50-12.5 MG per tablet   Oral   Take 1 tablet by mouth daily.         Marland Kitchen omeprazole (PRILOSEC OTC) 20 MG tablet   Oral   Take 20 mg by mouth daily.          . Fluticasone Furoate-Vilanterol (BREO ELLIPTA) 100-25 MCG/INH AEPB   Inhalation   Inhale 1 puff into the lungs daily.   60 each   5   . Fluticasone Furoate-Vilanterol (BREO ELLIPTA) 100-25 MCG/INH AEPB   Inhalation   Inhale 1 puff into the lungs daily.   14 each   0      Review of Systems: Gen:  Denies  fever, sweats,  chills HEENT: Denies blurred vision, double vision, ear pain, eye pain, hearing loss, nose bleeds, sore throat Cvc:  No dizziness, chest pain or heaviness Resp:   Admits to:sob with exertion, hoarse voice, productive sputum - chronic Gi: Denies swallowing difficulty, stomach pain, nausea or vomiting, diarrhea, constipation, bowel incontinence Gu:  Denies bladder incontinence, burning urine Ext:   No Joint pain, stiffness or swelling Skin: No skin rash, easy bruising or bleeding or hives Endoc:  No polyuria, polydipsia , polyphagia or weight change Other:  All other systems negative  Allergies:  Sulfa antibiotics; Amoxicillin;  Asa; and Levaquin  Physical Examination:  VS: BP 158/70 mmHg  Pulse 108  Ht 5\' 1"  (1.549 m)  Wt 162 lb 9.6 oz (73.755 kg)  BMI 30.74 kg/m2  SpO2 95%  General Appearance: No distress  HEENT: PERRLA, no ptosis, no other lesions noticed, moderate erythema of the posterior pharynx Pulmonary:coarse upper airway sounds, no wheezes, no stridor, no rales\ronchi, good airway movement.  Cardiovascular:  Normal S1,S2.  No m/r/g.     Abdomen:Exam: Benign, Soft, non-tender, No masses  Skin:   warm, no rashes, no ecchymosis  Extremities: normal, no cyanosis, clubbing, warm with normal capillary refill.     Assessment and Plan: 75 year old female with chronic sinusitis, asthma/COPD seen in follow-up visit today. Asthma, chronic Patient with known asthma, currently Advair 250/50. 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 Today, she is at baseline breathing. Advair is costing her about $35 per month out of pocket. Still with chronic cough since URI in Dec 2016   Plan: -Continue control of allergic rhinitis/nasal congestion with as needed nasal steroid and allergy control. -BreoEllipta 100/25. 1 puff  daily. Gargle and rinse after each use. Sample and coupon given today.  If this is not financially feasible will got back to Adviar 250/50.  Patient educated on the use, administration, side effects, and proper technique of this medication -continue with Prilosec 20 mg one tab by mouth twice a day. -check 2 view CXR for chronic cough.     Updated Medication List Outpatient Encounter Prescriptions as of 03/28/2015  Medication Sig  . albuterol (PROVENTIL) (2.5 MG/3ML) 0.083% nebulizer solution Take 2.5 mg by nebulization every 6 (six) hours as needed for wheezing or shortness of breath.  . Albuterol Sulfate (PROAIR RESPICLICK) 123XX123 (90 BASE) MCG/ACT AEPB Inhale 2 puffs into the lungs every 6 (six) hours as needed.  . Calcium Carbonate-Vitamin D 600-400 MG-UNIT per tablet Take 1 tablet by mouth 2 (two) times daily.  . Cholecalciferol (VITAMIN D3) 2000 UNITS capsule Take 1 capsule by mouth daily.  . clarithromycin (BIAXIN) 500 MG tablet Take 1 tablet (500 mg total) by mouth 2 (two) times daily.  . Fluticasone-Salmeterol (ADVAIR) 250-50 MCG/DOSE AEPB Inhale 1 puff into the lungs 2 (two) times daily.  Marland Kitchen ibuprofen (ADVIL,MOTRIN) 200 MG tablet Take 400 mg by mouth as needed.   Marland Kitchen LACTOBACILLUS ACID-PECTIN PO Take 1 capsule by mouth daily.  Marland Kitchen loratadine (CLARITIN) 10 MG tablet Take 10 mg by mouth daily.  Marland Kitchen losartan-hydrochlorothiazide (HYZAAR) 50-12.5 MG per tablet Take 1 tablet by mouth daily.  Marland Kitchen omeprazole (PRILOSEC OTC) 20 MG tablet Take 20 mg by mouth daily.   . [DISCONTINUED] losartan-hydrochlorothiazide (HYZAAR) 50-12.5 MG tablet Take by mouth.  . Fluticasone Furoate-Vilanterol (BREO ELLIPTA) 100-25 MCG/INH AEPB Inhale 1 puff into the lungs daily.  . Fluticasone Furoate-Vilanterol (BREO ELLIPTA) 100-25 MCG/INH AEPB Inhale 1 puff into the lungs daily.  . [DISCONTINUED] predniSONE (DELTASONE) 20 MG tablet Take 1 tablet (20 mg total) by mouth daily with breakfast. (Patient not taking: Reported on  03/28/2015)   No facility-administered encounter medications on file as of 03/28/2015.    Orders for this visit: Orders Placed This Encounter  Procedures  . DG Chest 2 View    Standing Status: Future     Number of Occurrences:      Standing Expiration Date: 05/27/2016  Order Specific Question:  Reason for Exam (SYMPTOM  OR DIAGNOSIS REQUIRED)    Answer:  persistent cough    Order Specific Question:  Preferred imaging location?    Answer:  [facility]    Thank  you for the visitation and for allowing  [facility] to assist in the care of your patient. Our recommendations are noted above.  Please contact us if we can be of further service.  Vilinda Boehringer, MD [facility] Office Number: [phone]

## 2015-04-08 ENCOUNTER — Ambulatory Visit: Payer: Medicare Other

## 2015-04-17 ENCOUNTER — Other Ambulatory Visit: Payer: Self-pay | Admitting: Physician Assistant

## 2015-04-17 ENCOUNTER — Ambulatory Visit
Admission: RE | Admit: 2015-04-17 | Discharge: 2015-04-17 | Disposition: A | Discharge status: Home / Self Care | Payer: Medicare Other | Source: Ambulatory Visit | Attending: Physician Assistant | Admitting: Physician Assistant

## 2015-04-17 DIAGNOSIS — Z1231 Encounter for screening mammogram for malignant neoplasm of breast: Secondary | ICD-10-CM | POA: Insufficient documentation

## 2015-04-20 ENCOUNTER — Ambulatory Visit: Payer: Medicare Other

## 2015-04-20 ENCOUNTER — Ambulatory Visit
Admission: EM | Admit: 2015-04-20 | Discharge: 2015-04-20 | Disposition: A | Discharge status: Home / Self Care | Payer: Medicare Other | Attending: Family Medicine | Admitting: Family Medicine

## 2015-04-20 DIAGNOSIS — J018 Other acute sinusitis: Secondary | ICD-10-CM | POA: Diagnosis not present

## 2015-04-20 DIAGNOSIS — J45901 Unspecified asthma with (acute) exacerbation: Secondary | ICD-10-CM | POA: Diagnosis not present

## 2015-04-20 DIAGNOSIS — J069 Acute upper respiratory infection, unspecified: Secondary | ICD-10-CM

## 2015-04-20 HISTORY — DX: Chronic obstructive pulmonary disease, unspecified: J44.9

## 2015-04-20 MED ORDER — DOXYCYCLINE HYCLATE 100 MG PO CAPS: 100.0000 mg | ORAL_CAPSULE | Freq: Two times a day (BID) | ORAL | Status: DC

## 2015-04-20 MED ORDER — IPRATROPIUM-ALBUTEROL 0.5-2.5 (3) MG/3ML IN SOLN
3.0000 mL | Freq: Four times a day (QID) | RESPIRATORY_TRACT | Status: DC
  Administered 2015-04-20: 3 mL via RESPIRATORY_TRACT

## 2015-04-20 MED ORDER — METHYLPREDNISOLONE SODIUM SUCC 125 MG IJ SOLR
62.5000 mg | Freq: Once | INTRAMUSCULAR | Status: AC
  Administered 2015-04-20: 62.5 mg via INTRAVENOUS

## 2015-04-20 MED ORDER — PREDNISONE 10 MG (21) PO TBPK: 10.0000 mg | ORAL_TABLET | Freq: Every day | ORAL | Status: DC

## 2015-04-20 NOTE — ED Notes (Signed)
Chest Xray was cancelled and pt did not have CXR

## 2015-04-20 NOTE — ED Provider Notes (Signed)
CSN: [id number]     Arrival date & time 04/20/15  0913 History   First MD Initiated Contact with Patient 04/20/15 [phone]     Chief Complaint  Patient presents with  . Wheezing    One week of congestion. Yesterday started wheezing and left ear stopped up. SOB with exertion. No fevers. "I usually need Prednisone"  Denies pain   (Consider location/radiation/quality/duration/timing/severity/associated sxs/prior Treatment) HPI: Patient presents today with symptoms of nasal drainage and congestion, mild productive cough, wheezing for the last few days. She does have some colored mucus. Patient denies any fever. She states that this is usually related to her sinuses and asthma. She does take Claritin for allergy symptoms however doesn't seem to think that it helps much. Dr. Edwin Dada is her pulmonologist and the patient is on Advair and Albuterol as needed. She has been using her medications as prescribed. She denies any chest pain, calf pain or swelling. She has had some shortness of breath with her wheezing. Patient denies any history of DVT/PE, CHF. She denies any hx of DM.   Past Medical History  Diagnosis Date  . Asthma   . Cough variant asthma   . HTN (hypertension)   . Diabetes ([place])   . Dyslipidemia   . COPD (chronic obstructive pulmonary disease) [facility])    Past Surgical History  Procedure Laterality Date  . Primary open reduction procedure  11/09/2013    fragment of bone and fixation using screws   History reviewed. No pertinent family history. Social History  Substance Use Topics  . Smoking status: Never Smoker   . Smokeless tobacco: Never Used  . Alcohol Use: No   OB History    No data available     Review of Systems: Negative except mentioned above.   Allergies  Sulfa antibiotics; Amoxicillin; Asa; and Levaquin  Home Medications   Prior to Admission medications   Medication Sig Start Date End Date Taking? Authorizing Provider  albuterol (PROVENTIL) (2.5 MG/3ML) 0.083%  nebulizer solution Take 2.5 mg by nebulization every 6 (six) hours as needed for wheezing or shortness of breath.   Yes Historical Provider, MD  Albuterol Sulfate (PROAIR RESPICLICK) 123XX123 (90 BASE) MCG/ACT AEPB Inhale 2 puffs into the lungs every 6 (six) hours as needed. 05/16/14  Yes Vishal Mungal, MD  Calcium Carbonate-Vitamin D 600-400 MG-UNIT per tablet Take 1 tablet by mouth 2 (two) times daily.   Yes Historical Provider, MD  Cholecalciferol (VITAMIN D3) 2000 UNITS capsule Take 1 capsule by mouth daily.   Yes Historical Provider, MD  Fluticasone-Salmeterol (ADVAIR) 250-50 MCG/DOSE AEPB Inhale 1 puff into the lungs 2 (two) times daily.   Yes Historical Provider, MD  ibuprofen (ADVIL,MOTRIN) 200 MG tablet Take 400 mg by mouth as needed.    Yes Historical Provider, MD  LACTOBACILLUS ACID-PECTIN PO Take 1 capsule by mouth daily.   Yes Historical Provider, MD  loratadine (CLARITIN) 10 MG tablet Take 10 mg by mouth daily.   Yes Historical Provider, MD  losartan-hydrochlorothiazide (HYZAAR) 50-12.5 MG per tablet Take 1 tablet by mouth daily. 02/26/14  Yes Historical Provider, MD  omeprazole (PRILOSEC OTC) 20 MG tablet Take 20 mg by mouth daily.    Yes Historical Provider, MD  doxycycline (VIBRAMYCIN) 100 MG capsule Take 1 capsule (100 mg total) by mouth 2 (two) times daily. 04/20/15   Paulina Fusi, MD  Fluticasone Furoate-Vilanterol (BREO ELLIPTA) 100-25 MCG/INH AEPB Inhale 1 puff into the lungs daily. 03/28/15 03/28/16  Vilinda Boehringer, MD  predniSONE (STERAPRED UNI-PAK  21 TAB) 10 MG (21) TBPK tablet Take 1 tablet (10 mg total) by mouth daily. Take 6 tabs by mouth daily  for 2 days, then 5 tabs for 2 days, then 4 tabs for 2 days, then 3 tabs for 2 days, 2 tabs for 2 days, then 1 tab by mouth daily for 2 days 04/20/15   Paulina Fusi, MD   Meds Ordered and Administered this Visit   Medications  ipratropium-albuterol (DUONEB) 0.5-2.5 (3) MG/3ML nebulizer solution 3 mL (3 mLs Nebulization Given 04/20/15 0940)     BP 119/62 mmHg  Pulse 89  Temp(Src) 97.8 F (36.6 C) (Oral)  Resp 24  Ht 5\' 1"  (1.549 m)  Wt 161 lb (73.029 kg)  BMI 30.44 kg/m2  SpO2 97% No data found.   Physical Exam   GENERAL: NAD HEENT: mild pharyngeal erythema, no exudate, no erythema of TMs, mild cerumen bilaterally, no cervical LAD RESP: minimal expiratory wheezing (after neb treatment), no accessory muscle use CARD: RRR EXTREM: -[place], - LE edema NEURO: CN II-XII grossly intact   ED Course  Procedures (including critical care time)  Labs Review Labs Reviewed - No data to display  Imaging Review No results found.    MDM   1. URI, acute   2. Other acute sinusitis   3. Asthma exacerbation   Patient given nebulizer treatment in the office which did help her symptoms, she states that usually she needs a 12 day course of oral prednisone for her symptoms. I have prescribed her the Sterapred, Doxycycline, continue to use inhalers as prescribed. I've asked that she follow-up with her pulmonologist this week if possible. If her symptoms do worsen I have recommended that she go to the ER as discussed. She was given 62.5 IM Solu-Medrol prior to discharge today. She will start her oral Prednisone tomorrow. She is to continue her antihistamine. I have suggested that she try Zyrtec or Allegra if Claritin is not helping. Patient addresses understanding of plan.    Paulina Fusi, MD 04/20/15 1019

## 2015-05-30 DIAGNOSIS — R7303 Prediabetes: Secondary | ICD-10-CM | POA: Diagnosis not present

## 2015-05-30 DIAGNOSIS — I10 Essential (primary) hypertension: Secondary | ICD-10-CM | POA: Diagnosis not present

## 2015-05-30 DIAGNOSIS — R799 Abnormal finding of blood chemistry, unspecified: Secondary | ICD-10-CM | POA: Diagnosis not present

## 2015-05-30 DIAGNOSIS — K219 Gastro-esophageal reflux disease without esophagitis: Secondary | ICD-10-CM | POA: Diagnosis not present

## 2015-05-30 DIAGNOSIS — R946 Abnormal results of thyroid function studies: Secondary | ICD-10-CM | POA: Diagnosis not present

## 2015-05-30 DIAGNOSIS — J4540 Moderate persistent asthma, uncomplicated: Secondary | ICD-10-CM | POA: Diagnosis not present

## 2015-05-30 DIAGNOSIS — J301 Allergic rhinitis due to pollen: Secondary | ICD-10-CM | POA: Diagnosis not present

## 2015-05-30 DIAGNOSIS — E782 Mixed hyperlipidemia: Secondary | ICD-10-CM | POA: Diagnosis not present

## 2015-06-17 ENCOUNTER — Ambulatory Visit: Payer: Medicare Other | Admitting: Internal Medicine

## 2015-07-03 ENCOUNTER — Ambulatory Visit
Admission: RE | Admit: 2015-07-03 | Discharge: 2015-07-03 | Disposition: A | Discharge status: Home / Self Care | Payer: Medicare Other | Source: Ambulatory Visit | Attending: Internal Medicine | Admitting: Internal Medicine

## 2015-07-03 ENCOUNTER — Encounter: Payer: Self-pay | Admitting: Internal Medicine

## 2015-07-03 ENCOUNTER — Ambulatory Visit (INDEPENDENT_AMBULATORY_CARE_PROVIDER_SITE_OTHER): Payer: Medicare Other | Admitting: Internal Medicine

## 2015-07-03 VITALS — BP 138/60 | HR 107 | Ht 60.0 in | Wt 154.0 lb

## 2015-07-03 DIAGNOSIS — R062 Wheezing: Secondary | ICD-10-CM

## 2015-07-03 DIAGNOSIS — J4540 Moderate persistent asthma, uncomplicated: Secondary | ICD-10-CM | POA: Diagnosis not present

## 2015-07-03 DIAGNOSIS — R05 Cough: Secondary | ICD-10-CM | POA: Diagnosis not present

## 2015-07-03 MED ORDER — IPRATROPIUM-ALBUTEROL 0.5-2.5 (3) MG/3ML IN SOLN
3.0000 mL | Freq: Once | RESPIRATORY_TRACT | Status: DC
  Administered 2015-07-03: 3 mL via RESPIRATORY_TRACT

## 2015-07-03 MED ORDER — FLUTICASONE-SALMETEROL 500-50 MCG/DOSE IN AEPB: 1.0000 | INHALATION_SPRAY | Freq: Two times a day (BID) | RESPIRATORY_TRACT | Status: DC

## 2015-07-03 MED ORDER — IPRATROPIUM-ALBUTEROL 0.5-2.5 (3) MG/3ML IN SOLN
3.0000 mL | Freq: Once | RESPIRATORY_TRACT | Status: AC
  Administered 2015-07-03: 3 mL via RESPIRATORY_TRACT

## 2015-07-03 MED ORDER — PREDNISONE 20 MG PO TABS: 20.0000 mg | ORAL_TABLET | Freq: Every day | ORAL | Status: DC

## 2015-07-03 NOTE — Progress Notes (Signed)
MRN# [id number] Anita Kent [birth year]   CC: Chief Complaint  Patient presents with  . Follow-up    had CXR today. pt c/o increased sob, chest congestion, wheezing, occ prod cough yellow in color X1wk. didn't notice change in breathing with breo      Events since last clinic visit Presents today for a follow up visit of asthma. Over the past 2 weeks has had increased nasal drainage and nighttime wheezing, in addition to daytime wheezing    Medication:   Current Outpatient Rx  Name  Route  Sig  Dispense  Refill  . albuterol (PROVENTIL) (2.5 MG/3ML) 0.083% nebulizer solution   Nebulization   Take 2.5 mg by nebulization every 6 (six) hours as needed for wheezing or shortness of breath.         . Albuterol Sulfate (PROAIR RESPICLICK) 123XX123 (90 BASE) MCG/ACT AEPB   Inhalation   Inhale 2 puffs into the lungs every 6 (six) hours as needed.   1 each   11   . Calcium Carbonate-Vitamin D 600-400 MG-UNIT per tablet   Oral   Take 1 tablet by mouth 2 (two) times daily.         . cetirizine (ZYRTEC) 5 MG tablet   Oral   Take 5 mg by mouth daily.         . Cholecalciferol (VITAMIN D3) 2000 UNITS capsule   Oral   Take 1 capsule by mouth daily.         . Fluticasone-Salmeterol (ADVAIR) 250-50 MCG/DOSE AEPB   Inhalation   Inhale 1 puff into the lungs 2 (two) times daily.         Marland Kitchen ibuprofen (ADVIL,MOTRIN) 200 MG tablet   Oral   Take 400 mg by mouth as needed.          Marland Kitchen LACTOBACILLUS ACID-PECTIN PO   Oral   Take 1 capsule by mouth daily.         Marland Kitchen losartan-hydrochlorothiazide (HYZAAR) 50-12.5 MG per tablet   Oral   Take 1 tablet by mouth daily.         Marland Kitchen omeprazole (PRILOSEC OTC) 20 MG tablet   Oral   Take 20 mg by mouth daily.             Review of Systems: Gen:  Denies  fever, sweats, chills HEENT: Denies blurred vision, double vision, ear pain, eye pain, hearing loss, nose bleeds, sore throat Cvc:  No dizziness, chest pain or heaviness Resp:    Admits to:sob with exertion, hoarse voice, productive sputum - chronic Gi: Denies swallowing difficulty, stomach pain, nausea or vomiting, diarrhea, constipation, bowel incontinence Gu:  Denies bladder incontinence, burning urine Ext:   No Joint pain, stiffness or swelling Skin: No skin rash, easy bruising or bleeding or hives Endoc:  No polyuria, polydipsia , polyphagia or weight change Other:  All other systems negative  Allergies:  Sulfa antibiotics; Amoxicillin; Asa; and Levaquin  Physical Examination:  VS: BP 138/60 mmHg  Pulse 107  Ht 5' (1.524 m)  Wt 154 lb (69.854 kg)  BMI 30.08 kg/m2  SpO2 93%  General Appearance: No distress  HEENT: PERRLA, no ptosis, no other lesions noticed, moderate erythema of the posterior pharynx Pulmonary:coarse upper airway sounds, no wheezes, no stridor, no rales\ronchi, good airway movement.  Cardiovascular:  Normal S1,S2.  No m/r/g.     Abdomen:Exam: Benign, Soft, non-tender, No masses  Skin:   warm, no rashes, no ecchymosis  Extremities: normal, no cyanosis, clubbing, warm  with normal capillary refill.     Assessment and Plan: 75 year old female with chronic sinusitis, asthma/COPD seen in follow-up visit today. No problem-specific assessment & plan notes found for this encounter.   Updated Medication List Outpatient Encounter Prescriptions as of 07/03/2015  Medication Sig  . albuterol (PROVENTIL) (2.5 MG/3ML) 0.083% nebulizer solution Take 2.5 mg by nebulization every 6 (six) hours as needed for wheezing or shortness of breath.  . Albuterol Sulfate (PROAIR RESPICLICK) 123XX123 (90 BASE) MCG/ACT AEPB Inhale 2 puffs into the lungs every 6 (six) hours as needed.  . Calcium Carbonate-Vitamin D 600-400 MG-UNIT per tablet Take 1 tablet by mouth 2 (two) times daily.  . cetirizine (ZYRTEC) 5 MG tablet Take 5 mg by mouth daily.  . Cholecalciferol (VITAMIN D3) 2000 UNITS capsule Take 1 capsule by mouth daily.  . Fluticasone-Salmeterol (ADVAIR) 250-50  MCG/DOSE AEPB Inhale 1 puff into the lungs 2 (two) times daily.  Marland Kitchen ibuprofen (ADVIL,MOTRIN) 200 MG tablet Take 400 mg by mouth as needed.   Marland Kitchen LACTOBACILLUS ACID-PECTIN PO Take 1 capsule by mouth daily.  Marland Kitchen losartan-hydrochlorothiazide (HYZAAR) 50-12.5 MG per tablet Take 1 tablet by mouth daily.  Marland Kitchen omeprazole (PRILOSEC OTC) 20 MG tablet Take 20 mg by mouth daily.   . [DISCONTINUED] Fluticasone Furoate-Vilanterol (BREO ELLIPTA) 100-25 MCG/INH AEPB Inhale 1 puff into the lungs daily.  . [DISCONTINUED] doxycycline (VIBRAMYCIN) 100 MG capsule Take 1 capsule (100 mg total) by mouth 2 (two) times daily. (Patient not taking: Reported on 07/03/2015)  . [DISCONTINUED] loratadine (CLARITIN) 10 MG tablet Take 10 mg by mouth daily. Reported on 07/03/2015  . [DISCONTINUED] predniSONE (STERAPRED UNI-PAK 21 TAB) 10 MG (21) TBPK tablet Take 1 tablet (10 mg total) by mouth daily. Take 6 tabs by mouth daily  for 2 days, then 5 tabs for 2 days, then 4 tabs for 2 days, then 3 tabs for 2 days, 2 tabs for 2 days, then 1 tab by mouth daily for 2 days (Patient not taking: Reported on 07/03/2015)   No facility-administered encounter medications on file as of 07/03/2015.    Orders for this visit: No orders of the defined types were placed in this encounter.    Thank  you for the visitation and for allowing  [facility] to assist in the care of your patient. Our recommendations are noted above.  Please contact us if we can be of further service.  Vilinda Boehringer, MD [facility] Office Number: [phone]

## 2015-07-03 NOTE — Progress Notes (Signed)
MRN# [id number] Anita Kent [birth year]   CC: Chief Complaint  Patient presents with  . Follow-up    had CXR today. pt c/o increased sob, chest congestion, wheezing, occ prod cough yellow in color X1wk. didn't notice change in breathing with breo      Brief History: 03/06/2014 HPI Patient is a pleasant 75 year old female presents today for a established care visit for optimization of asthma. Patient states that over the past year her asthma has been difficult to control without the use of steroids almost every 3 months. These tapers with ease her symptoms for about 2-3 months until asthma that acting up again, and will be responsive to steroids. She is a Never smoker, but exposure to second hand smoke as a child until 81 yo. Usually has some wheezing about twice a year that clears with steroids prior to 2015. Patient started seen ENT (Dr. Tami Ribas) about 9 years ago for sinus congestion, had allergy testing done (was not a candidate for allergy shot), had nasal septal surgery done, currently on a nasal steroid as needed.  ENT also clinically diagnosed her with Asthma about 9 years ago, she has been on Advair since. Patient has tried [facility] in the past, but did not like the taste and felt it was not providing a benefit, so she continued with Advair.  Two years ago (2014) she was hospitalized for an asthma attack (visitng friends in the [place]), during the spring), admitted for 7 days (received antibiotics and steroids, no intubation). Patient has never been intubated for respiratory issues. Since that asthma attack, she states that she has had frequent episodes of her asthma acting up requiring steroids.  No pets at home. 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 She states that since her last visit she has baseline shortness of breath and nasal congestion,  however over the last 2 weeks she started to have increased wheezing and more sputum production along with more nasal drainage. She denies any fever, chills, hemoptysis. Also, over the last 2 weeks she's had to use her nebulizer at least once per day, daily, for shortness of breath and dyspnea on exertion. At her last visit she was started [facility] trial, but did not feel any benefit from it, went back to Advair. Medication:   Current Outpatient Rx  Name  Route  Sig  Dispense  Refill  . albuterol (PROVENTIL) (2.5 MG/3ML) 0.083% nebulizer solution   Nebulization   Take 2.5 mg by nebulization every 6 (six) hours as needed for wheezing or shortness of breath.         . Albuterol Sulfate (PROAIR RESPICLICK) 123XX123 (90 BASE) MCG/ACT AEPB   Inhalation   Inhale 2 puffs into the lungs every 6 (six) hours as needed.   1 each   11   . Calcium Carbonate-Vitamin D 600-400 MG-UNIT per tablet   Oral   Take 1 tablet by mouth 2 (two) times daily.         . cetirizine (ZYRTEC) 5 MG tablet   Oral   Take 5 mg by mouth daily.         . Cholecalciferol (VITAMIN D3) 2000 UNITS capsule   Oral   Take 1 capsule by mouth daily.         . Fluticasone-Salmeterol (ADVAIR) 250-50 MCG/DOSE AEPB   Inhalation   Inhale 1 puff into the lungs 2 (two) times  daily.         . ibuprofen (ADVIL,MOTRIN) 200 MG tablet   Oral   Take 400 mg by mouth as needed.          Marland Kitchen LACTOBACILLUS ACID-PECTIN PO   Oral   Take 1 capsule by mouth daily.         Marland Kitchen losartan-hydrochlorothiazide (HYZAAR) 50-12.5 MG per tablet   Oral   Take 1 tablet by mouth daily.         Marland Kitchen omeprazole (PRILOSEC OTC) 20 MG tablet   Oral   Take 20 mg by mouth daily.             Review of Systems: Gen:  Denies  fever, sweats, chills HEENT: Denies blurred vision, double vision, ear pain, eye pain, hearing loss, nose bleeds, sore throat Cvc:  No dizziness, chest pain or heaviness Resp:   Admits to:sob with exertion, hoarse voice,  productive sputum - chronic Gi: Denies swallowing difficulty, stomach pain, nausea or vomiting, diarrhea, constipation, bowel incontinence Gu:  Denies bladder incontinence, burning urine Ext:   No Joint pain, stiffness or swelling Skin: No skin rash, easy bruising or bleeding or hives Endoc:  No polyuria, polydipsia , polyphagia or weight change Other:  All other systems negative  Allergies:  Sulfa antibiotics; Amoxicillin; Asa; and Levaquin  Physical Examination:  VS: BP 138/60 mmHg  Pulse 107  Ht 5' (1.524 m)  Wt 154 lb (69.854 kg)  BMI 30.08 kg/m2  SpO2 93%  General Appearance: No distress  HEENT: PERRLA, no ptosis, no other lesions noticed, moderate erythema of the posterior pharynx Pulmonary: Pre BD - diffuse exp wheezes, tight breath sounds, shallow basilar sounds, no crackles.  Post BD - improved in wheezing in the upper lobes, mild exp wheezing in bilateral lower lobes, but improved air movement.  Cardiovascular:  Normal S1,S2.  No m/r/g.     Abdomen:Exam: Benign, Soft, non-tender, No masses  Skin:   warm, no rashes, no ecchymosis  Extremities: normal, no cyanosis, clubbing, warm with normal capillary refill.     Assessment and Plan: 75 year old female with chronic sinusitis, asthma/COPD seen in follow-up visit today. Asthma, chronic Patient with known asthma, currently Advair 250/50. 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 Today, she is in a mild COPD/asthmatic exacerbation, requiring 2 nebulizer treatments in the office along with a prescription for prednisone for 5 days. We will resume her Advair, and stop Breo (it did not provide any additional cost or clinical benefit).  Optimize Astham/COPD regiment by increasing to Advair 500/50.  Still with chronic cough since URI in Dec 2016 I believe that  her allergic/rhinitis/postnasal drip is a major contribution to her asthma/COPD exacerbations, will start patient on nasal saline rinses in an effort to optimize her allergy control.  Plan: - Continue control of allergic rhinitis/nasal congestion with as needed nasal steroid and allergy control, start nasal saline rinses 3-4 times per week - start Advair 500/50 - 1 puff bid, gargle and rinse after each use.  - continue with Prilosec 20 mg one tab by mouth twice a day. - Prednisone 20mg  x 5 days.       Updated Medication List Outpatient Encounter Prescriptions as of 07/03/2015  Medication Sig  . albuterol (PROVENTIL) (2.5 MG/3ML) 0.083% nebulizer solution Take 2.5 mg by nebulization every 6 (six) hours as  needed for wheezing or shortness of breath.  . Albuterol Sulfate (PROAIR RESPICLICK) 123XX123 (90 BASE) MCG/ACT AEPB Inhale 2 puffs into the lungs every 6 (six) hours as needed.  . Calcium Carbonate-Vitamin D 600-400 MG-UNIT per tablet Take 1 tablet by mouth 2 (two) times daily.  . cetirizine (ZYRTEC) 5 MG tablet Take 5 mg by mouth daily.  . Cholecalciferol (VITAMIN D3) 2000 UNITS capsule Take 1 capsule by mouth daily.  . Fluticasone-Salmeterol (ADVAIR) 250-50 MCG/DOSE AEPB Inhale 1 puff into the lungs 2 (two) times daily.  Marland Kitchen ibuprofen (ADVIL,MOTRIN) 200 MG tablet Take 400 mg by mouth as needed.   Marland Kitchen LACTOBACILLUS ACID-PECTIN PO Take 1 capsule by mouth daily.  Marland Kitchen losartan-hydrochlorothiazide (HYZAAR) 50-12.5 MG per tablet Take 1 tablet by mouth daily.  Marland Kitchen omeprazole (PRILOSEC OTC) 20 MG tablet Take 20 mg by mouth daily.   . [DISCONTINUED] Fluticasone Furoate-Vilanterol (BREO ELLIPTA) 100-25 MCG/INH AEPB Inhale 1 puff into the lungs daily.  . [DISCONTINUED] doxycycline (VIBRAMYCIN) 100 MG capsule Take 1 capsule (100 mg total) by mouth 2 (two) times daily. (Patient not taking: Reported on 07/03/2015)  . [DISCONTINUED] loratadine (CLARITIN) 10 MG tablet Take 10 mg by mouth daily. Reported on 07/03/2015   . [DISCONTINUED] predniSONE (STERAPRED UNI-PAK 21 TAB) 10 MG (21) TBPK tablet Take 1 tablet (10 mg total) by mouth daily. Take 6 tabs by mouth daily  for 2 days, then 5 tabs for 2 days, then 4 tabs for 2 days, then 3 tabs for 2 days, 2 tabs for 2 days, then 1 tab by mouth daily for 2 days (Patient not taking: Reported on 07/03/2015)   No facility-administered encounter medications on file as of 07/03/2015.    Orders for this visit: No orders of the defined types were placed in this encounter.    Thank  you for the visitation and for allowing  [facility] to assist in the care of your patient. Our recommendations are noted above.  Please contact us if we can be of further service.  Vilinda Boehringer, MD [facility] Office Number: [phone]

## 2015-07-03 NOTE — Addendum Note (Signed)
Addended by: Maryanna Shape A on: 07/03/2015 04:07 PM   Modules accepted: Orders

## 2015-07-03 NOTE — Assessment & Plan Note (Addendum)
Patient with known asthma, currently Advair 250/50. 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 Today, she is in a mild COPD/asthmatic exacerbation, requiring 2 nebulizer treatments in the office along with a prescription for prednisone for 5 days. We will resume her Advair, and stop Breo (it did not provide any additional cost or clinical benefit).  Optimize Astham/COPD regiment by increasing to Advair 500/50.  Still with chronic cough since URI in Dec 2016 I believe that her allergic/rhinitis/postnasal drip is a major contribution to her asthma/COPD exacerbations, will start patient on nasal saline rinses in an effort to optimize her allergy control.  Plan: - Continue control of allergic rhinitis/nasal congestion with as needed nasal steroid and allergy control, start nasal saline rinses 3-4 times per week - start Advair 500/50 - 1 puff bid, gargle and rinse after each use.  - continue with Prilosec 20 mg one tab by mouth twice a day. - Prednisone 20mg  x 5 days.

## 2015-07-03 NOTE — Patient Instructions (Addendum)
Follow up with Dr. Stevenson Clinch in: 3 months - complete your current prescription of  Advair 250/50 (1 puff twice a day), once this is done, then start Advair 500/50 - 1 puff bid, gargle and rinse after each use. DO NOT double up on your current advair dose (250/50).  - nasal saline rinses - 3-4 times per week for allergies - prednisone 20mg   - 1 tab daily with breakfast. X 5 days - continue with current allergy regiment.  - albuterol nebulizer - 1 neb treatment every 6 hours for 3 days, then 1 neb treatment as needed every 6 hours as needed for shortness of breath\wheezing\recurrent cough

## 2015-07-05 ENCOUNTER — Telehealth: Payer: Self-pay | Admitting: *Deleted

## 2015-07-05 NOTE — Telephone Encounter (Signed)
LM on VM for pt to return call for CXR results.

## 2015-07-05 NOTE — Telephone Encounter (Signed)
-----   Message from Vilinda Boehringer, MD sent at 07/04/2015  8:27 AM EDT ----- Regarding: CXR results. Please inform patient that her CXR is within normal limits, there are no masses or findings of pneumonia  Thank you. VM

## 2015-07-08 NOTE — Telephone Encounter (Signed)
She will require aCXR and an acute care visit.   - VM

## 2015-07-08 NOTE — Telephone Encounter (Signed)
Pt informed of results.   She states she had finished the 5 days of Prednisone and states she doesn't feel like there has been any improvement and she is still wheezing. Please advise.

## 2015-07-08 NOTE — Telephone Encounter (Signed)
Pt informed. Had CXR last week. Offered an appt for tomorrow morning but pt said she would give it a couple days so appt scheduled for Thursday. Nothing further needed.

## 2015-07-11 ENCOUNTER — Ambulatory Visit: Payer: Medicare Other | Admitting: Internal Medicine

## 2015-07-29 DIAGNOSIS — R946 Abnormal results of thyroid function studies: Secondary | ICD-10-CM | POA: Diagnosis not present

## 2015-08-31 IMAGING — CT CT CHEST W/ CM
2 of 3 series · 15 of 36 positions shown, 18 images · IV contrast (omnipaque)
Comparison: Chest radiograph September 25, 2013

CLINICAL DATA: Increased cough and wheezing.  History of asthma

EXAM:
CT CHEST WITH CONTRAST
TECHNIQUE: Multidetector CT imaging of the chest was performed during
intravenous contrast administration.
CONTRAST:  75 mL Omnipaque 300 nonionic

[Series 2: routine chest with · axial · 0.57mm/px · z∈[-698,-454]mm · 12 of 59 slices shown, 15 images]
[im 5/59  mediastinal]
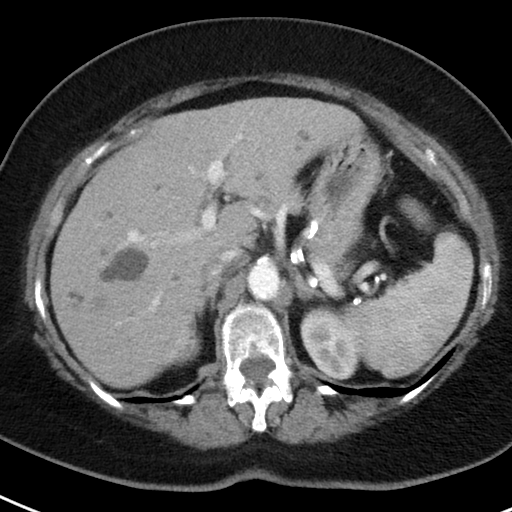
[im 5/59  lung]
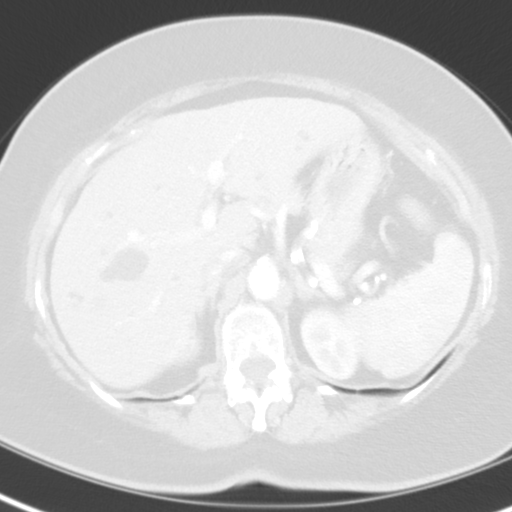
[im 9/59  lung]
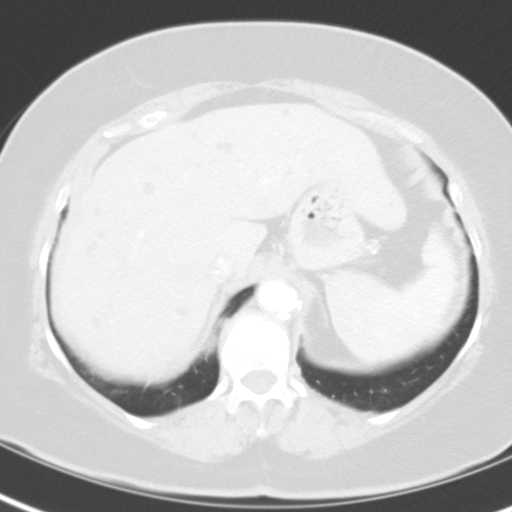
[im 13/59  lung]
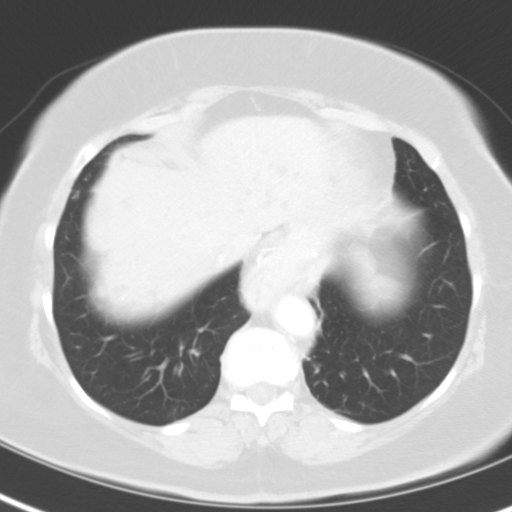
[im 18/59  lung]
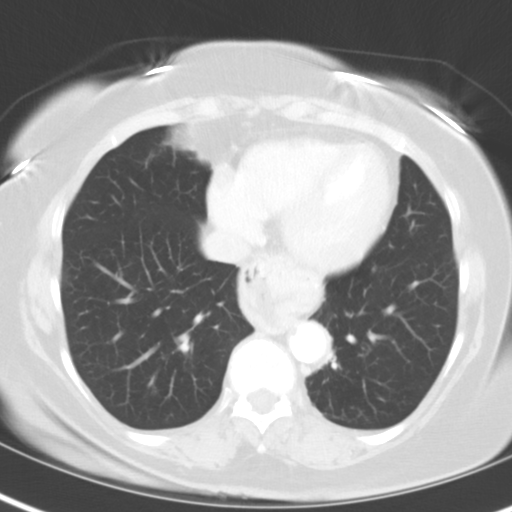
[im 22/59  mediastinal]
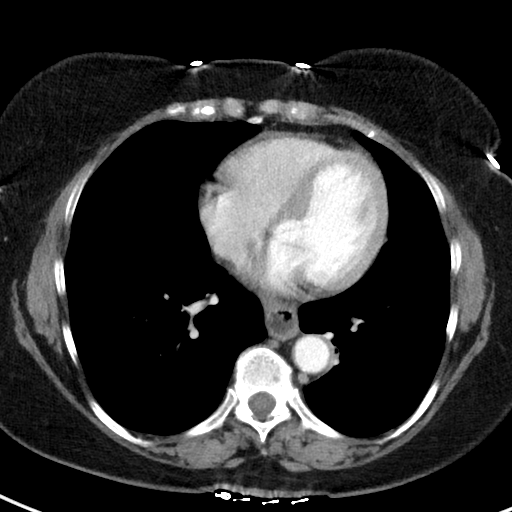
[im 22/59  lung]
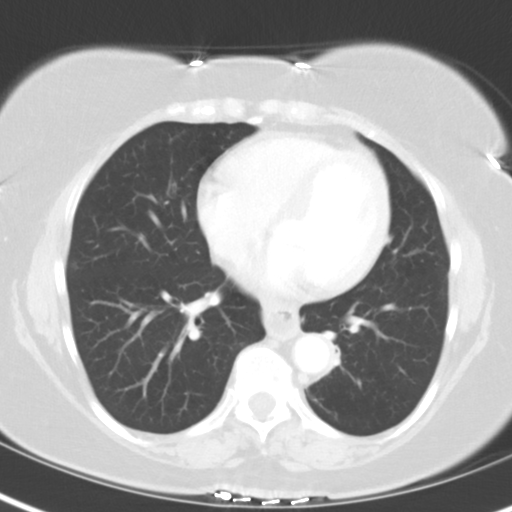
[im 26/59  lung]
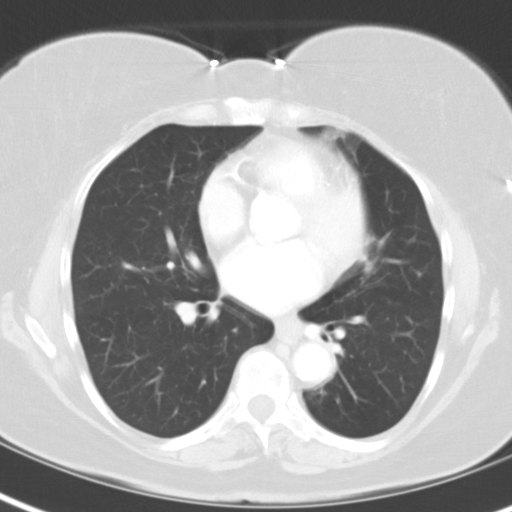
[im 33/59  lung]
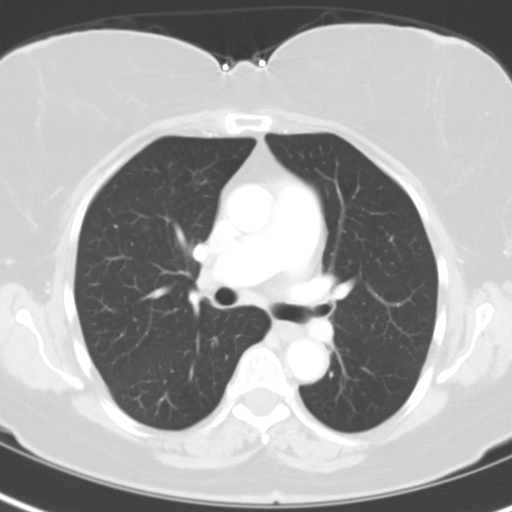
[im 37/59  lung]
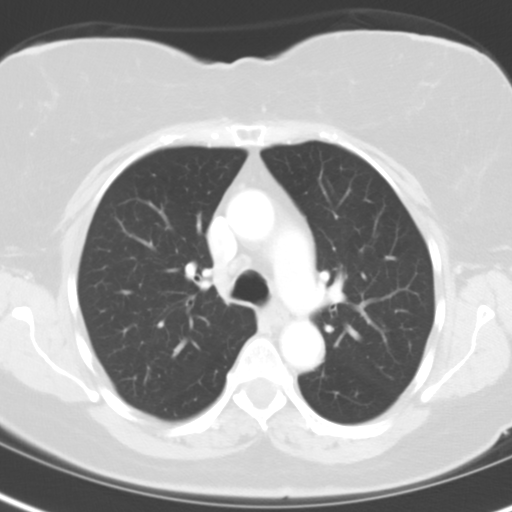
[im 41/59  mediastinal]
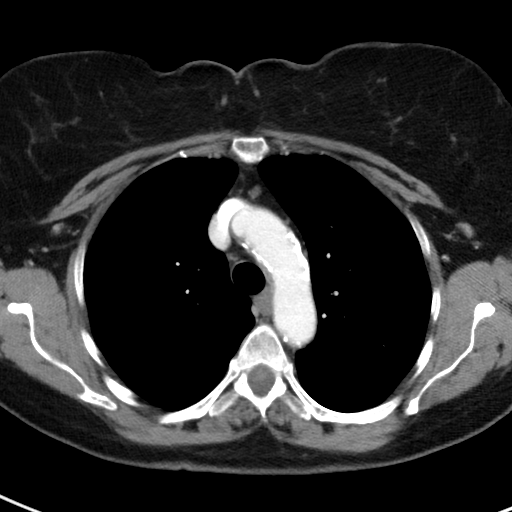
[im 41/59  lung]
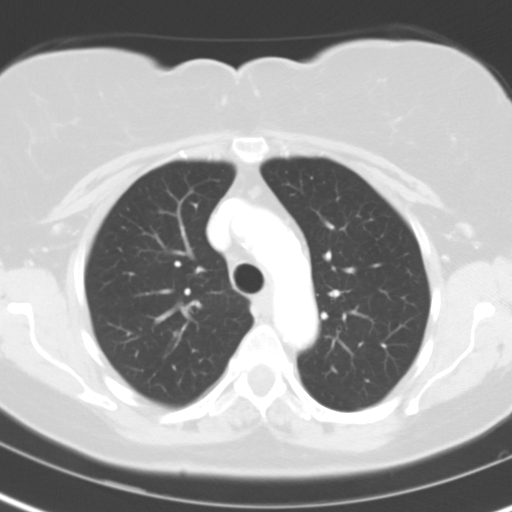
[im 46/59  lung]
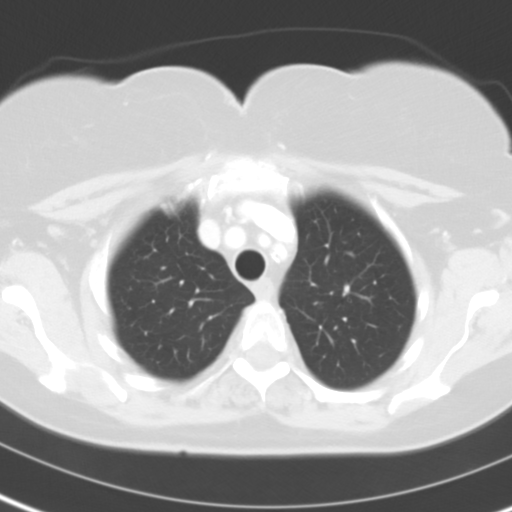
[im 50/59  lung]
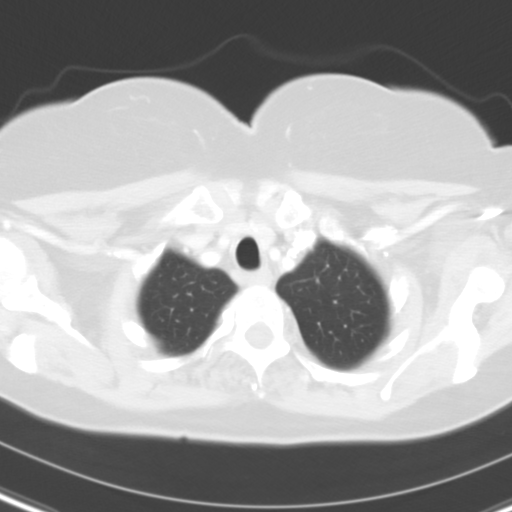
[im 54/59  lung]
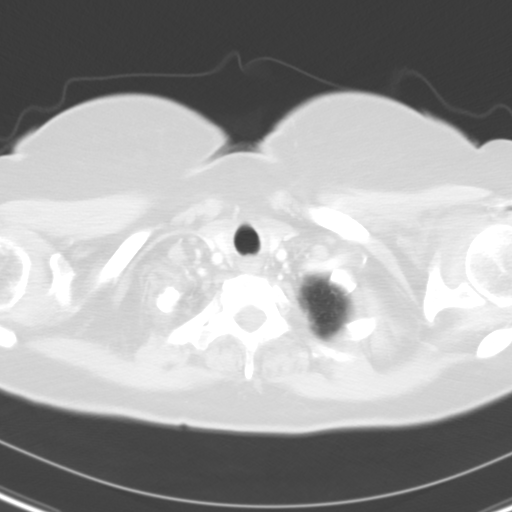

[Series 5: cor routine chest with · coronal · 0.56mm/px · 3 of 133 slices shown]
[im 27/133  lung]
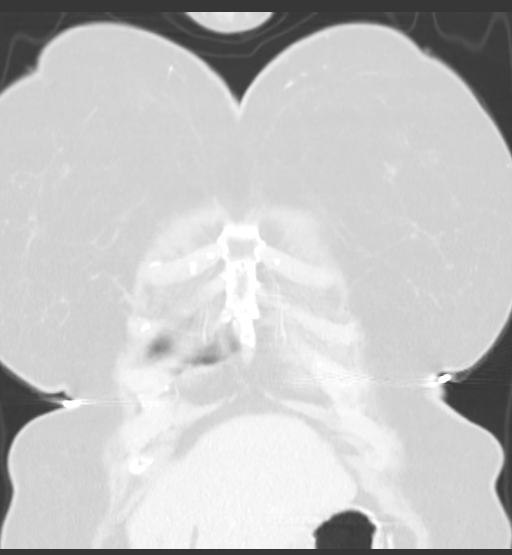
[im 53/133  lung]
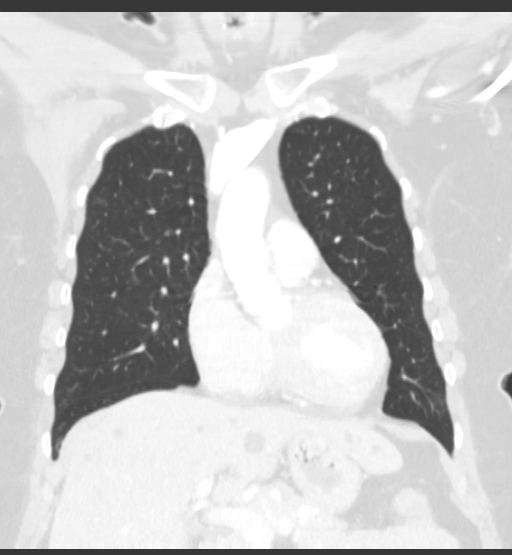
[im 80/133  lung]
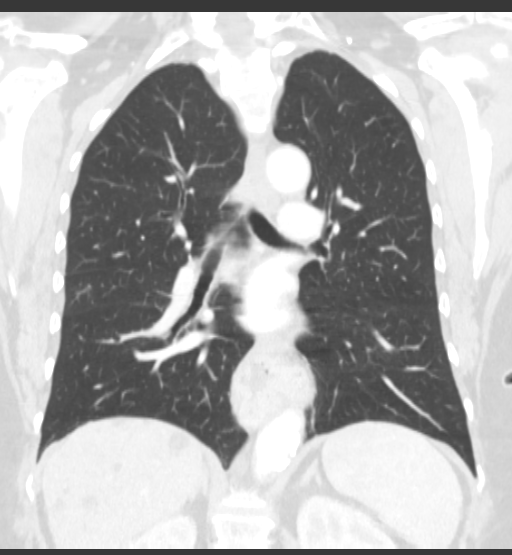

[15 of 36 positions shown; findings below may reference images not displayed]

FINDINGS: There is underlying centrilobular emphysematous change. There is no
edema or consolidation. There is rather minimal scarring in the lung
bases. On axial slice 39 series 3, there is a 2 mm nodular opacity
in the lateral segment of the right middle lobe. No larger nodular
opacities are identified.

There is atherosclerotic change in the aorta without aneurysm or
dissection. No demonstrable pulmonary embolus. There is no
appreciable thoracic adenopathy. There are multiple foci of coronary
artery calcification.

There is a moderate hiatal hernia.

In the visualized upper abdomen, there are multiple cysts throughout
the liver ranging in size from as small as 3 mm to as large as 2.7 x
2.5 cm. There is atherosclerotic change in the visualized upper
abdominal aorta and splenic artery.

There are no blastic or lytic bone lesions. Thyroid appears
unremarkable.
IMPRESSION: Underlying emphysema.  No edema or consolidation.

2 mm nodular opacity lateral segment right middle lobe. Followup of
this nodular opacity should be based on [HOSPITAL]
guidelines. If the patient is at high risk for bronchogenic
carcinoma, follow-up chest CT at 1 year is recommended. If the
patient is at low risk, no follow-up is needed. This recommendation
follows the consensus statement: Guidelines for Management of Small
Pulmonary Nodules Detected on CT Scans: A Statement from the

No adenopathy. Moderate hiatal hernia. Multiple cysts in liver.
Multiple foci of atherosclerotic change.

## 2015-09-10 DIAGNOSIS — R946 Abnormal results of thyroid function studies: Secondary | ICD-10-CM | POA: Diagnosis not present

## 2015-09-18 ENCOUNTER — Ambulatory Visit
Admission: EM | Admit: 2015-09-18 | Discharge: 2015-09-18 | Disposition: A | Discharge status: Home / Self Care | Payer: Medicare Other | Attending: Emergency Medicine | Admitting: Emergency Medicine

## 2015-09-18 ENCOUNTER — Encounter: Payer: Self-pay | Admitting: *Deleted

## 2015-09-18 DIAGNOSIS — H6123 Impacted cerumen, bilateral: Secondary | ICD-10-CM | POA: Diagnosis not present

## 2015-09-18 DIAGNOSIS — H66001 Acute suppurative otitis media without spontaneous rupture of ear drum, right ear: Secondary | ICD-10-CM | POA: Diagnosis not present

## 2015-09-18 HISTORY — DX: Disorder of thyroid, unspecified: E07.9

## 2015-09-18 HISTORY — DX: Essential (primary) hypertension: I10

## 2015-09-18 MED ORDER — AZITHROMYCIN 250 MG PO TABS: 250.0000 mg | ORAL_TABLET | Freq: Every day | ORAL | 0 refills | Status: DC

## 2015-09-18 NOTE — ED Provider Notes (Signed)
HPI  SUBJECTIVE:  Anita Kent is a 75 y.o. female who presents with 3 days of her right ear "being stopped up". States that her hearing is muffled, she has decreased hearing. States her ear feels full. She tried over-the-counter wax removal eardrops which did not help. There are no other aggravating or alleviating factors. She denies tinnitus, ear pain. No otorrhea, foreign body insertion, hearing aid insertion. no recent swimming. No fevers. She reports nasal congestion and postnasal drip, which is not new for her. She denies sinus pain or pressure, upper dental pain. She uses saline nasal irrigation, Flonase occasionally for this. She has a past history of asthma, emphysema, COPD, "sinus problems" and hypertension. No history of allergies. Patient denies history of diabetes although this is listed as a problem in her chart. PMD: Dr. Lupe Carney at the [place] [facility].    Past Medical History:  Diagnosis Date  . Asthma   . COPD (chronic obstructive pulmonary disease) ([place])   . Cough variant asthma   . Diabetes ([place])   . Dyslipidemia   . HTN (hypertension)   . Hypertension   . Thyroid disease     Past Surgical History:  Procedure Laterality Date  . primary open reduction procedure  11/09/2013   fragment of bone and fixation using screws    History reviewed. No pertinent family history.  Social History  Substance Use Topics  . Smoking status: Never Smoker  . Smokeless tobacco: Never Used  . Alcohol use No    No current facility-administered medications for this encounter.   Current Outpatient Prescriptions:  .  albuterol (PROVENTIL) (2.5 MG/3ML) 0.083% nebulizer solution, Take 2.5 mg by nebulization every 6 (six) hours as needed for wheezing or shortness of breath., Disp: , Rfl:  .  Calcium Carbonate-Vitamin D 600-400 MG-UNIT per tablet, Take 1 tablet by mouth 2 (two) times daily., Disp: , Rfl:  .  Cholecalciferol (VITAMIN D3) 2000 UNITS capsule, Take 1 capsule by mouth  daily., Disp: , Rfl:  .  Fluticasone-Salmeterol (ADVAIR DISKUS) 500-50 MCG/DOSE AEPB, Inhale 1 puff into the lungs 2 (two) times daily., Disp: 60 each, Rfl: 3 .  Fluticasone-Salmeterol (ADVAIR) 250-50 MCG/DOSE AEPB, Inhale 1 puff into the lungs 2 (two) times daily., Disp: , Rfl:  .  ibuprofen (ADVIL,MOTRIN) 200 MG tablet, Take 400 mg by mouth as needed. , Disp: , Rfl:  .  LACTOBACILLUS ACID-PECTIN PO, Take 1 capsule by mouth daily., Disp: , Rfl:  .  levothyroxine (SYNTHROID, LEVOTHROID) 75 MCG tablet, Take 75 mcg by mouth daily before breakfast., Disp: , Rfl:  .  loratadine (CLARITIN) 10 MG tablet, Take 10 mg by mouth daily., Disp: , Rfl:  .  losartan-hydrochlorothiazide (HYZAAR) 50-12.5 MG per tablet, Take 1 tablet by mouth daily., Disp: , Rfl:  .  omeprazole (PRILOSEC OTC) 20 MG tablet, Take 20 mg by mouth daily. , Disp: , Rfl:  .  Albuterol Sulfate (PROAIR RESPICLICK) 123XX123 (90 BASE) MCG/ACT AEPB, Inhale 2 puffs into the lungs every 6 (six) hours as needed., Disp: 1 each, Rfl: 11 .  azithromycin (ZITHROMAX) 250 MG tablet, Take 1 tablet (250 mg total) by mouth daily. 2 tabs po on day 1, 1 tab po on days 2-5, Disp: 6 tablet, Rfl: 0  Allergies  Allergen Reactions  . Sulfa Antibiotics   . Amoxicillin Hives  . Asa [Aspirin] Hives  . Levaquin [Levofloxacin In D5w] Hives     ROS  As noted in HPI.   Physical Exam  BP (!) 154/68 (  BP Location: Left Arm)   Pulse 74   Temp 97.8 F (36.6 C) (Oral)   Resp 18   Ht 5' (1.524 m)   Wt 148 lb (67.1 kg)   SpO2 97%   BMI 28.90 kg/m   Constitutional: Well developed, well nourished, no acute distress Eyes:  EOMI, conjunctiva normal bilaterally HENT: Normocephalic, atraumatic,mucus membranes moist. Hearing intact, but diminished bilaterally. Bilateral cerumen impaction both ears. No pain with traction on pinna right ear. Positive nasal congestion, no sinus tenderness.  Respiratory: Normal inspiratory effort Cardiovascular: Normal rate GI:  nondistended skin: No rash, skin intact Musculoskeletal: no deformities Neurologic: Alert & oriented x 3, no focal neuro deficits Psychiatric: Speech and behavior appropriate   ED Course   Medications - No data to display  No orders of the defined types were placed in this encounter.   No results found for this or any previous visit (from the past 24 hour(s)). No results found.  ED Clinical Impression  Acute suppurative otitis media of right ear without spontaneous rupture of tympanic membrane, recurrence not specified  Cerumen impaction, bilateral   ED Assessment/Plan   will have the ears irrigated due to extensive cerumen impaction. We'll reevaluate.  on reevaluation, continued decreased hearing in the right ear but has improved. she has a dull, erythematous, bulging tympanic membrane with purulent effusion on the right side. R external ear canal erythematous, but expected. TM intact. Left external ear canal slightly erythematous, TM intact and normal.  Home with azithromycin Due to penicillin allergy. Patient reports hives. Otherwise supportive treatment as needed . Follow-up with primary care physician as needed. Discussed MDM, plan and followup with patient.  Patient agrees with plan.   *This clinic note was created using Dragon dictation software. Therefore, there may be occasional mistakes despite careful proofreading.  ?   Melynda Ripple, MD 09/18/15 [phone]

## 2015-09-18 NOTE — ED Triage Notes (Signed)
Patient started having right ear pain 2 days ago. OTC medications have not resolved symptoms. Patient does have a history of chronic sinus problems.

## 2015-09-24 DIAGNOSIS — J324 Chronic pansinusitis: Secondary | ICD-10-CM | POA: Diagnosis not present

## 2015-09-24 DIAGNOSIS — H66001 Acute suppurative otitis media without spontaneous rupture of ear drum, right ear: Secondary | ICD-10-CM | POA: Diagnosis not present

## 2015-09-27 ENCOUNTER — Ambulatory Visit: Payer: Medicare Other | Admitting: Internal Medicine

## 2015-10-15 DIAGNOSIS — E039 Hypothyroidism, unspecified: Secondary | ICD-10-CM | POA: Diagnosis not present

## 2015-10-16 DIAGNOSIS — H65111 Acute and subacute allergic otitis media (mucoid) (sanguinous) (serous), right ear: Secondary | ICD-10-CM | POA: Diagnosis not present

## 2015-11-21 DIAGNOSIS — E782 Mixed hyperlipidemia: Secondary | ICD-10-CM | POA: Diagnosis not present

## 2015-11-21 DIAGNOSIS — R946 Abnormal results of thyroid function studies: Secondary | ICD-10-CM | POA: Diagnosis not present

## 2015-11-21 DIAGNOSIS — R799 Abnormal finding of blood chemistry, unspecified: Secondary | ICD-10-CM | POA: Diagnosis not present

## 2015-11-21 DIAGNOSIS — R7303 Prediabetes: Secondary | ICD-10-CM | POA: Diagnosis not present

## 2015-11-21 DIAGNOSIS — I10 Essential (primary) hypertension: Secondary | ICD-10-CM | POA: Diagnosis not present

## 2015-12-05 DIAGNOSIS — E119 Type 2 diabetes mellitus without complications: Secondary | ICD-10-CM | POA: Diagnosis not present

## 2015-12-05 DIAGNOSIS — E039 Hypothyroidism, unspecified: Secondary | ICD-10-CM | POA: Diagnosis not present

## 2015-12-05 DIAGNOSIS — Z7952 Long term (current) use of systemic steroids: Secondary | ICD-10-CM | POA: Diagnosis not present

## 2015-12-05 DIAGNOSIS — J4541 Moderate persistent asthma with (acute) exacerbation: Secondary | ICD-10-CM | POA: Diagnosis not present

## 2015-12-05 DIAGNOSIS — I10 Essential (primary) hypertension: Secondary | ICD-10-CM | POA: Diagnosis not present

## 2015-12-05 DIAGNOSIS — K219 Gastro-esophageal reflux disease without esophagitis: Secondary | ICD-10-CM | POA: Diagnosis not present

## 2015-12-05 DIAGNOSIS — N950 Postmenopausal bleeding: Secondary | ICD-10-CM | POA: Diagnosis not present

## 2015-12-05 DIAGNOSIS — E782 Mixed hyperlipidemia: Secondary | ICD-10-CM | POA: Diagnosis not present

## 2016-01-12 ENCOUNTER — Ambulatory Visit
Admission: EM | Admit: 2016-01-12 | Discharge: 2016-01-12 | Disposition: A | Discharge status: Home / Self Care | Payer: Medicare Other | Attending: Emergency Medicine | Admitting: Emergency Medicine

## 2016-01-12 ENCOUNTER — Encounter: Payer: Self-pay | Admitting: Emergency Medicine

## 2016-01-12 DIAGNOSIS — J441 Chronic obstructive pulmonary disease with (acute) exacerbation: Secondary | ICD-10-CM | POA: Diagnosis not present

## 2016-01-12 MED ORDER — IPRATROPIUM-ALBUTEROL 0.5-2.5 (3) MG/3ML IN SOLN
3.0000 mL | Freq: Once | RESPIRATORY_TRACT | Status: AC
  Administered 2016-01-12: 3 mL via RESPIRATORY_TRACT

## 2016-01-12 MED ORDER — PREDNISONE 10 MG (21) PO TBPK: 10.0000 mg | ORAL_TABLET | Freq: Every day | ORAL | 0 refills | Status: DC

## 2016-01-12 NOTE — ED Triage Notes (Signed)
Patient c/o SOB that started over the weekend.  Patient denies fevers.  Patient reports history of asthma and COPD.

## 2016-01-12 NOTE — ED Provider Notes (Signed)
CSN: [id number]     Arrival date & time 01/12/16  [id number] History   First MD Initiated Contact with Patient 01/12/16 1224     Chief Complaint  Patient presents with  . Shortness of Breath   (Consider location/radiation/quality/duration/timing/severity/associated sxs/prior Treatment) HPI  75 year old female with a known history of COPD and asthma who presents with shortness of breath and wheezing started the weekend. She just returned home from a trip and had the exacerbation of her COPD. He said usually a prednisone taper will cure her. His never smoked but contracted COPD from secondhand smoke as a child. Oxygen sensation medication was 100% on room air. She is afebrile today.      Past Medical History:  Diagnosis Date  . Asthma   . COPD (chronic obstructive pulmonary disease) ([place])   . Cough variant asthma   . Diabetes ([place])   . Dyslipidemia   . HTN (hypertension)   . Hypertension   . Thyroid disease    Past Surgical History:  Procedure Laterality Date  . primary open reduction procedure  11/09/2013   fragment of bone and fixation using screws   History reviewed. No pertinent family history. Social History  Substance Use Topics  . Smoking status: Never Smoker  . Smokeless tobacco: Never Used  . Alcohol use No   OB History    No data available     Review of Systems  Constitutional: Positive for activity change. Negative for chills, diaphoresis and fatigue.  Respiratory: Positive for cough, shortness of breath and wheezing.   All other systems reviewed and are negative.   Allergies  Sulfa antibiotics; Amoxicillin; Asa [aspirin]; and Levaquin [levofloxacin in d5w]  Home Medications   Prior to Admission medications   Medication Sig Start Date End Date Taking? Authorizing Provider  albuterol (PROVENTIL) (2.5 MG/3ML) 0.083% nebulizer solution Take 2.5 mg by nebulization every 6 (six) hours as needed for wheezing or shortness of breath.    Historical Provider, MD   Albuterol Sulfate (PROAIR RESPICLICK) 123XX123 (90 BASE) MCG/ACT AEPB Inhale 2 puffs into the lungs every 6 (six) hours as needed. 05/16/14   Vilinda Boehringer, MD  Calcium Carbonate-Vitamin D 600-400 MG-UNIT per tablet Take 1 tablet by mouth 2 (two) times daily.    Historical Provider, MD  Cholecalciferol (VITAMIN D3) 2000 UNITS capsule Take 1 capsule by mouth daily.    Historical Provider, MD  Fluticasone-Salmeterol (ADVAIR DISKUS) 500-50 MCG/DOSE AEPB Inhale 1 puff into the lungs 2 (two) times daily. 07/03/15 07/02/16  Vishal Mungal, MD  Fluticasone-Salmeterol (ADVAIR) 250-50 MCG/DOSE AEPB Inhale 1 puff into the lungs 2 (two) times daily.    Historical Provider, MD  ibuprofen (ADVIL,MOTRIN) 200 MG tablet Take 400 mg by mouth as needed.     Historical Provider, MD  LACTOBACILLUS ACID-PECTIN PO Take 1 capsule by mouth daily.    Historical Provider, MD  levothyroxine (SYNTHROID, LEVOTHROID) 75 MCG tablet Take 75 mcg by mouth daily before breakfast.    Historical Provider, MD  loratadine (CLARITIN) 10 MG tablet Take 10 mg by mouth daily.    Historical Provider, MD  losartan-hydrochlorothiazide (HYZAAR) 50-12.5 MG per tablet Take 1 tablet by mouth daily. 02/26/14   Historical Provider, MD  omeprazole (PRILOSEC OTC) 20 MG tablet Take 20 mg by mouth daily.     Historical Provider, MD  predniSONE (STERAPRED UNI-PAK 21 TAB) 10 MG (21) TBPK tablet Take 1 tablet (10 mg total) by mouth daily. Take 6 tabs by mouth daily  for 2 days, then 5  tabs for 2 days, then 4 tabs for 2 days, then 3 tabs for 2 days, 2 tabs for 2 days, then 1 tab by mouth daily for 2 days 01/12/16   Lorin Picket, PA-C   Meds Ordered and Administered this Visit   Medications  ipratropium-albuterol (DUONEB) 0.5-2.5 (3) MG/3ML nebulizer solution 3 mL (3 mLs Nebulization Given 01/12/16 1233)    BP 109/68 (BP Location: Right Arm)   Pulse 75   Temp 97.5 F (36.4 C) (Oral)   Resp 16   Ht 5\' 1"  (1.549 m)   Wt 150 lb (68 kg)   SpO2 100%   BMI  28.34 kg/m  No data found.   Physical Exam  Constitutional: She is oriented to person, place, and time. She appears well-developed and well-nourished. No distress.  HENT:  Head: Normocephalic and atraumatic.  Right Ear: External ear normal.  Left Ear: External ear normal.  Eyes: EOM are normal. Pupils are equal, round, and reactive to light. Right eye exhibits no discharge. Left eye exhibits no discharge.  Neck: Normal range of motion. Neck supple.  Pulmonary/Chest: Effort normal. No respiratory distress. She has wheezes. She has no rales. She exhibits no tenderness.  Musculoskeletal: Normal range of motion.  Neurological: She is alert and oriented to person, place, and time.  Skin: Skin is warm and dry. She is not diaphoretic.  Psychiatric: She has a normal mood and affect. Her behavior is normal. Judgment and thought content normal.  Nursing note and vitals reviewed.   Urgent Care Course   Clinical Course     Procedures (including critical care time)  Labs Review Labs Reviewed - No data to display  Imaging Review No results found.   Visual Acuity Review  Right Eye Distance:   Left Eye Distance:   Bilateral Distance:    Right Eye Near:   Left Eye Near:    Bilateral Near:    Medications  ipratropium-albuterol (DUONEB) 0.5-2.5 (3) MG/3ML nebulizer solution 3 mL (3 mLs Nebulization Given 01/12/16 1233)  Patient states that she felt much better and her lung sounds dramatically after the above nebulizer treatment Discharge Medication List as of 01/12/2016 12:51 PM    START taking these medications   Details  predniSONE (STERAPRED UNI-PAK 21 TAB) 10 MG (21) TBPK tablet Take 1 tablet (10 mg total) by mouth daily. Take 6 tabs by mouth daily  for 2 days, then 5 tabs for 2 days, then 4 tabs for 2 days, then 3 tabs for 2 days, 2 tabs for 2 days, then 1 tab by mouth daily for 2 days, Starting Sun 01/12/2016, Print         MDM   1. COPD exacerbation [facility])    Discharge  Medication List as of 01/12/2016 12:51 PM    START taking these medications   Details  predniSONE (STERAPRED UNI-PAK 21 TAB) 10 MG (21) TBPK tablet Take 1 tablet (10 mg total) by mouth daily. Take 6 tabs by mouth daily  for 2 days, then 5 tabs for 2 days, then 4 tabs for 2 days, then 3 tabs for 2 days, 2 tabs for 2 days, then 1 tab by mouth daily for 2 days, Starting Sun 01/12/2016, Print      Plan: 1. Test/x-ray results and diagnosis reviewed with patient 2. rx as per orders; risks, benefits, potential side effects reviewed with patient 3. Recommend supportive treatment with use of her nebulizer at home. Should follow-up with her primary care physician or pulmonologist within  the next week. Antibiotics were not indicated at this point in time. 4. F/u prn if symptoms worsen or don't improve     Lorin Picket, PA-C 01/12/16 1327

## 2016-02-25 ENCOUNTER — Encounter: Payer: Self-pay | Admitting: Emergency Medicine

## 2016-02-25 ENCOUNTER — Ambulatory Visit
Admission: EM | Admit: 2016-02-25 | Discharge: 2016-02-25 | Disposition: A | Discharge status: Home / Self Care | Payer: Medicare Other | Attending: Family Medicine | Admitting: Family Medicine

## 2016-02-25 DIAGNOSIS — J441 Chronic obstructive pulmonary disease with (acute) exacerbation: Secondary | ICD-10-CM

## 2016-02-25 MED ORDER — IPRATROPIUM-ALBUTEROL 0.5-2.5 (3) MG/3ML IN SOLN
3.0000 mL | Freq: Once | RESPIRATORY_TRACT | Status: AC
  Administered 2016-02-25: 3 mL via RESPIRATORY_TRACT

## 2016-02-25 MED ORDER — PREDNISONE 20 MG PO TABS: ORAL_TABLET | ORAL | 0 refills | Status: DC

## 2016-02-25 NOTE — ED Triage Notes (Signed)
Patient c/o cough, chest congestion and SOB that started 2 days ago.

## 2016-02-25 NOTE — ED Provider Notes (Signed)
[facility]    CSN: [id number] Arrival date & time: 02/25/16  1122     History   Chief Complaint Chief Complaint  Patient presents with  . Cough  . Shortness of Breath    HPI Anita Kent is a 76 y.o. female.   The history is provided by the patient.  Cough  Associated symptoms: shortness of breath and wheezing   Shortness of Breath  Associated symptoms: cough and wheezing   URI  Presenting symptoms: congestion and cough   Severity:  Moderate Onset quality:  Sudden Duration:  2 days Timing:  Constant Progression:  Worsening Chronicity:  New Relieved by:  Nothing Ineffective treatments:  Nebulizer treatments, OTC medications and inhaler Associated symptoms: wheezing   Associated symptoms comment:  Shortness of breath Risk factors: being elderly, chronic respiratory disease (copd/asthma) and sick contacts   Risk factors: no chronic cardiac disease, no chronic kidney disease, no diabetes mellitus, no immunosuppression, no recent illness and no recent travel     Past Medical History:  Diagnosis Date  . Asthma   . COPD (chronic obstructive pulmonary disease) ([place])   . Cough variant asthma   . Diabetes ([place])   . Dyslipidemia   . HTN (hypertension)   . Hypertension   . Thyroid disease     Patient Active Problem List   Diagnosis Date Noted  . Sinusitis, acute 08/13/2014  . Emphysema lung ([place]) 05/08/2014  . Asthma, chronic 03/06/2014  . Cough 03/06/2014    Past Surgical History:  Procedure Laterality Date  . primary open reduction procedure  11/09/2013   fragment of bone and fixation using screws    OB History    No data available       Home Medications    Prior to Admission medications   Medication Sig Start Date End Date Taking? Authorizing Provider  albuterol (PROVENTIL) (2.5 MG/3ML) 0.083% nebulizer solution Take 2.5 mg by nebulization every 6 (six) hours as needed for wheezing or shortness of breath.    Historical Provider, MD    Albuterol Sulfate (PROAIR RESPICLICK) 123XX123 (90 BASE) MCG/ACT AEPB Inhale 2 puffs into the lungs every 6 (six) hours as needed. 05/16/14   Vilinda Boehringer, MD  Calcium Carbonate-Vitamin D 600-400 MG-UNIT per tablet Take 1 tablet by mouth 2 (two) times daily.    Historical Provider, MD  Cholecalciferol (VITAMIN D3) 2000 UNITS capsule Take 1 capsule by mouth daily.    Historical Provider, MD  Fluticasone-Salmeterol (ADVAIR DISKUS) 500-50 MCG/DOSE AEPB Inhale 1 puff into the lungs 2 (two) times daily. 07/03/15 07/02/16  Vishal Mungal, MD  Fluticasone-Salmeterol (ADVAIR) 250-50 MCG/DOSE AEPB Inhale 1 puff into the lungs 2 (two) times daily.    Historical Provider, MD  ibuprofen (ADVIL,MOTRIN) 200 MG tablet Take 400 mg by mouth as needed.     Historical Provider, MD  LACTOBACILLUS ACID-PECTIN PO Take 1 capsule by mouth daily.    Historical Provider, MD  levothyroxine (SYNTHROID, LEVOTHROID) 75 MCG tablet Take 75 mcg by mouth daily before breakfast.    Historical Provider, MD  loratadine (CLARITIN) 10 MG tablet Take 10 mg by mouth daily.    Historical Provider, MD  losartan-hydrochlorothiazide (HYZAAR) 50-12.5 MG per tablet Take 1 tablet by mouth daily. 02/26/14   Historical Provider, MD  omeprazole (PRILOSEC OTC) 20 MG tablet Take 20 mg by mouth daily.     Historical Provider, MD  predniSONE (DELTASONE) 20 MG tablet 3 tabs po qd for 3 days, then 2 tabs po qd for  3 days, then 1 tab po qd for 3 days, then half a tab po qd for 3 days 02/25/16   Norval Gable, MD    Family History History reviewed. No pertinent family history.  Social History Social History  Substance Use Topics  . Smoking status: Never Smoker  . Smokeless tobacco: Never Used  . Alcohol use No     Allergies   Sulfa antibiotics; Amoxicillin; Asa [aspirin]; and Levaquin [levofloxacin in d5w]   Review of Systems Review of Systems  HENT: Positive for congestion.   Respiratory: Positive for cough, shortness of breath and wheezing.       Physical Exam Triage Vital Signs ED Triage Vitals  Enc Vitals Group     BP 02/25/16 1256 (!) 155/64     Pulse Rate 02/25/16 1256 78     Resp 02/25/16 1256 17     Temp 02/25/16 1256 97.8 F (36.6 C)     Temp Source 02/25/16 1256 Oral     SpO2 02/25/16 1256 100 %     Weight 02/25/16 1254 150 lb (68 kg)     Height 02/25/16 1254 5\' 1"  (1.549 m)     Head Circumference --      Peak Flow --      Pain Score 02/25/16 1255 0     Pain Loc --      Pain Edu? --      Excl. in [place]? --    No data found.   Updated Vital Signs BP (!) 155/64 (BP Location: Right Arm)   Pulse 78   Temp 97.8 F (36.6 C) (Oral)   Resp 17   Ht 5\' 1"  (1.549 m)   Wt 150 lb (68 kg)   SpO2 100%   BMI 28.34 kg/m   Visual Acuity Right Eye Distance:   Left Eye Distance:   Bilateral Distance:    Right Eye Near:   Left Eye Near:    Bilateral Near:     Physical Exam  Constitutional: She appears well-developed and well-nourished. No distress.  HENT:  Head: Normocephalic and atraumatic.  Right Ear: Tympanic membrane, external ear and ear canal normal.  Left Ear: Tympanic membrane, external ear and ear canal normal.  Nose: Mucosal edema and rhinorrhea present. No nose lacerations, sinus tenderness, nasal deformity, septal deviation or nasal septal hematoma. No epistaxis.  No foreign bodies.  Mouth/Throat: Uvula is midline, oropharynx is clear and moist and mucous membranes are normal. No oropharyngeal exudate.  Eyes: Conjunctivae and EOM are normal. Pupils are equal, round, and reactive to light. Right eye exhibits no discharge. Left eye exhibits no discharge. No scleral icterus.  Neck: Normal range of motion. Neck supple. No thyromegaly present.  Cardiovascular: Normal rate, regular rhythm and normal heart sounds.   Pulmonary/Chest: Effort normal. No respiratory distress. She has wheezes. She has no rales.  Lymphadenopathy:    She has no cervical adenopathy.  Skin: She is not diaphoretic.  Nursing note  and vitals reviewed.    UC Treatments / Results  Labs (all labs ordered are listed, but only abnormal results are displayed) Labs Reviewed - No data to display  EKG  EKG Interpretation None       Radiology No results found.  Procedures Procedures (including critical care time)  Medications Ordered in UC Medications  ipratropium-albuterol (DUONEB) 0.5-2.5 (3) MG/3ML nebulizer solution 3 mL (3 mLs Nebulization Given 02/25/16 1302)     Initial Impression / Assessment and Plan / UC Course  I have reviewed  the triage vital signs and the nursing notes.  Pertinent labs & imaging results that were available during my care of the patient were reviewed by me and considered in my medical decision making (see chart for details).  Clinical Course       Final Clinical Impressions(s) / UC Diagnoses   Final diagnoses:  COPD exacerbation ([place])    New Prescriptions Discharge Medication List as of 02/25/2016  1:28 PM    START taking these medications   Details  predniSONE (DELTASONE) 20 MG tablet 3 tabs po qd for 3 days, then 2 tabs po qd for 3 days, then 1 tab po qd for 3 days, then half a tab po qd for 3 days, Normal       1. diagnosis reviewed with patient 2. rx as per orders above; reviewed possible side effects, interactions, risks and benefits  3. Recommend supportive treatment with rest, fluids 4. Continue current home inhalers/medications 4. Follow-up prn if symptoms worsen or don't improve   Norval Gable, MD 02/25/16 1512

## 2016-04-09 DIAGNOSIS — J324 Chronic pansinusitis: Secondary | ICD-10-CM | POA: Diagnosis not present

## 2016-04-09 DIAGNOSIS — R05 Cough: Secondary | ICD-10-CM | POA: Diagnosis not present

## 2016-05-21 ENCOUNTER — Telehealth: Payer: Self-pay | Admitting: Pulmonary Disease

## 2016-05-21 NOTE — Telephone Encounter (Signed)
Spoke with pt and she sounds very SOB on the phone but pt is in [place]. appt scheduled with DS. Informed pt that if she got any distress or got any worse to go to the nearest ER. Pt verbalized understanding. Nothing further needed.

## 2016-05-21 NOTE — Telephone Encounter (Signed)
Former [employer] patient over due wants simonds and wants to be seen sooner  Pt c/o Shortness Of Breath: STAT if SOB developed within the last 24 hours or pt is noticeably SOB on the phone  1. Are you currently SOB (can you hear that pt is SOB on the phone)? Yes hear wheezing on the phone   2. How long have you been experiencing SOB? Wheezing started a couple days ago   3. Are you SOB when sitting or when up moving around?   On exertion   4. Are you currently experiencing any other symptoms?  No   Currently in [place] with son coming home Saturday

## 2016-05-22 DIAGNOSIS — J440 Chronic obstructive pulmonary disease with acute lower respiratory infection: Secondary | ICD-10-CM | POA: Diagnosis not present

## 2016-05-22 DIAGNOSIS — J449 Chronic obstructive pulmonary disease, unspecified: Secondary | ICD-10-CM | POA: Diagnosis not present

## 2016-05-22 DIAGNOSIS — R05 Cough: Secondary | ICD-10-CM | POA: Diagnosis not present

## 2016-05-22 DIAGNOSIS — Z6829 Body mass index (BMI) 29.0-29.9, adult: Secondary | ICD-10-CM | POA: Diagnosis not present

## 2016-05-22 DIAGNOSIS — J4541 Moderate persistent asthma with (acute) exacerbation: Secondary | ICD-10-CM | POA: Diagnosis not present

## 2016-05-27 ENCOUNTER — Institutional Professional Consult (permissible substitution): Payer: Medicare Other | Admitting: Pulmonary Disease

## 2016-06-15 DIAGNOSIS — N950 Postmenopausal bleeding: Secondary | ICD-10-CM | POA: Diagnosis not present

## 2016-06-17 DIAGNOSIS — H26491 Other secondary cataract, right eye: Secondary | ICD-10-CM | POA: Diagnosis not present

## 2016-06-17 DIAGNOSIS — M3501 Sicca syndrome with keratoconjunctivitis: Secondary | ICD-10-CM | POA: Diagnosis not present

## 2016-06-18 DIAGNOSIS — N950 Postmenopausal bleeding: Secondary | ICD-10-CM | POA: Diagnosis not present

## 2016-06-23 ENCOUNTER — Encounter
Admission: RE | Admit: 2016-06-23 | Discharge: 2016-06-23 | Disposition: A | Discharge status: Home / Self Care | Payer: Medicare Other | Source: Ambulatory Visit | Attending: Obstetrics & Gynecology | Admitting: Obstetrics & Gynecology

## 2016-06-23 DIAGNOSIS — E039 Hypothyroidism, unspecified: Secondary | ICD-10-CM | POA: Insufficient documentation

## 2016-06-23 DIAGNOSIS — Z01810 Encounter for preprocedural cardiovascular examination: Secondary | ICD-10-CM | POA: Diagnosis not present

## 2016-06-23 DIAGNOSIS — Z01812 Encounter for preprocedural laboratory examination: Secondary | ICD-10-CM | POA: Insufficient documentation

## 2016-06-23 DIAGNOSIS — E785 Hyperlipidemia, unspecified: Secondary | ICD-10-CM | POA: Diagnosis not present

## 2016-06-23 DIAGNOSIS — I447 Left bundle-branch block, unspecified: Secondary | ICD-10-CM | POA: Diagnosis not present

## 2016-06-23 DIAGNOSIS — E119 Type 2 diabetes mellitus without complications: Secondary | ICD-10-CM | POA: Insufficient documentation

## 2016-06-23 DIAGNOSIS — I10 Essential (primary) hypertension: Secondary | ICD-10-CM | POA: Insufficient documentation

## 2016-06-23 DIAGNOSIS — J449 Chronic obstructive pulmonary disease, unspecified: Secondary | ICD-10-CM | POA: Insufficient documentation

## 2016-06-23 HISTORY — DX: Hypothyroidism, unspecified: E03.9

## 2016-06-23 LAB — BASIC METABOLIC PANEL
Anion gap: 8 (ref 5–15)
BUN: 21 mg/dL — ABNORMAL HIGH (ref 6–20)
CO2: 26 mmol/L (ref 22–32)
Calcium: 9.4 mg/dL (ref 8.9–10.3)
Chloride: 104 mmol/L (ref 101–111)
Creatinine, Ser: 1.21 mg/dL — ABNORMAL HIGH (ref 0.44–1.00)
GFR calc Af Amer: 49 mL/min — ABNORMAL LOW (ref >60)
GFR calc non Af Amer: 42 mL/min — ABNORMAL LOW (ref >60)
Glucose, Bld: 77 mg/dL (ref 65–99)
Potassium: 3.9 mmol/L (ref 3.5–5.1)
Sodium: 138 mmol/L (ref 135–145)

## 2016-06-23 LAB — CBC
HCT: 34.3 % — ABNORMAL LOW (ref 35.0–47.0)
Hemoglobin: 11.4 g/dL — ABNORMAL LOW (ref 12.0–16.0)
MCH: 28.4 pg (ref 26.0–34.0)
MCHC: 33.3 g/dL (ref 32.0–36.0)
MCV: 85.3 fL (ref 80.0–100.0)
Platelets: 424 10*3/uL (ref 150–440)
RBC: 4.01 MIL/uL (ref 3.80–5.20)
RDW: 14.9 % — ABNORMAL HIGH (ref 11.5–14.5)
WBC: 6 10*3/uL (ref 3.6–11.0)

## 2016-06-23 LAB — TYPE AND SCREEN
ABO/RH(D): A POS
Antibody Screen: NEGATIVE

## 2016-06-23 NOTE — Patient Instructions (Signed)
  Your procedure is scheduled on: Mon. 06/29/16 Report to Day Surgery. To find out your arrival time please call [phone] between 1PM - 3PM on Friday.06/26/16  Remember: Instructions that are not followed completely may result in serious medical risk, up to and including death, or upon the discretion of your surgeon and anesthesiologist your surgery may need to be rescheduled.    _x___ 1. Do not eat food or drink liquids after midnight. No gum chewing or hard candies.     _x___ 2. No Alcohol for 24 hours before or after surgery.   ____ 3. Do Not Smoke For 24 Hours Prior to Your Surgery.   ____ 4. Bring all medications with you on the day of surgery if instructed.    _x___ 5. Notify your doctor if there is any change in your medical condition     (cold, fever, infections).       Do not wear jewelry, make-up, hairpins, clips or nail polish.  Do not wear lotions, powders, or perfumes. You may wear deodorant.  Do not shave 48 hours prior to surgery. Men may shave face and neck.  Do not bring valuables to the hospital.    [facility] is not responsible for any belongings or valuables.               Contacts, dentures or bridgework may not be worn into surgery.  Leave your suitcase in the car. After surgery it may be brought to your room.  For patients admitted to the hospital, discharge time is determined by your                treatment team.   Patients discharged the day of surgery will not be allowed to drive home.   Please read over the following fact sheets that you were given:      _x___ Take these medicines the morning of surgery with A SIP OF WATER:    1. levothyroxine (SYNTHROID, LEVOTHROID) 75 MCG tablet  2. omeprazole (PRILOSEC OTC) 20 MG tablet  3. loratadine (CLARITIN) 10 MG tablet  4.  5.  6.  ____ Fleet Enema (as directed)   ____ Use CHG Soap as directed  __x__ Use inhalers on the day of surgery  ____ Stop metformin 2 days prior to surgery    ____ Take  1/2 of usual insulin dose the night before surgery and none on the morning of surgery.   ____ Stop Coumadin/Plavix/aspirin on  __x__ Stop Anti-inflammatories on todayibuprofen (ADVIL,MOTRIN) 200 MG tablet   ____ Stop supplements until after surgery.    ____ Bring C-Pap to the hospital.

## 2016-06-24 NOTE — Pre-Procedure Instructions (Signed)
Met B and hemogram sent to Dr. Leonides Schanz and Anesthesia for review.  Also advised Dr. Leonides Schanz, need heart and lung assessment on H&P.

## 2016-06-24 NOTE — Pre-Procedure Instructions (Signed)
CARDIAC CLEARANCE REQUEST AS INSTRUCTED BY DR Rosey Bath WITH EKG CALLED AND FAXED TO DR Gaynelle Adu. SPOKE WITH PATRICIA. ALSO CALLED AND FAXED TO DR WARD. SPOKE WITH ANGIE

## 2016-06-29 ENCOUNTER — Ambulatory Visit
Admission: RE | Admit: 2016-06-29 | Payer: Medicare Other | Source: Ambulatory Visit | Admitting: Obstetrics & Gynecology

## 2016-06-29 ENCOUNTER — Encounter: Admission: RE | Payer: Self-pay | Source: Ambulatory Visit

## 2016-06-29 SURGERY — DILATATION & CURETTAGE/HYSTEROSCOPY WITH MYOSURE
Anesthesia: Choice

## 2016-06-30 DIAGNOSIS — I10 Essential (primary) hypertension: Secondary | ICD-10-CM | POA: Diagnosis not present

## 2016-06-30 DIAGNOSIS — I447 Left bundle-branch block, unspecified: Secondary | ICD-10-CM | POA: Diagnosis not present

## 2016-06-30 DIAGNOSIS — E782 Mixed hyperlipidemia: Secondary | ICD-10-CM | POA: Diagnosis not present

## 2016-06-30 DIAGNOSIS — I6523 Occlusion and stenosis of bilateral carotid arteries: Secondary | ICD-10-CM | POA: Diagnosis not present

## 2016-06-30 DIAGNOSIS — R0602 Shortness of breath: Secondary | ICD-10-CM | POA: Diagnosis not present

## 2016-07-15 DIAGNOSIS — J330 Polyp of nasal cavity: Secondary | ICD-10-CM | POA: Diagnosis not present

## 2016-07-16 ENCOUNTER — Institutional Professional Consult (permissible substitution): Payer: Medicare Other | Admitting: Pulmonary Disease

## 2016-07-21 ENCOUNTER — Emergency Department
Admission: EM | Admit: 2016-07-21 | Discharge: 2016-07-21 | Disposition: A | Discharge status: Home / Self Care | Payer: Medicare Other | Attending: Emergency Medicine | Admitting: Emergency Medicine

## 2016-07-21 ENCOUNTER — Encounter: Payer: Self-pay | Admitting: Emergency Medicine

## 2016-07-21 ENCOUNTER — Emergency Department: Payer: Medicare Other

## 2016-07-21 DIAGNOSIS — M546 Pain in thoracic spine: Secondary | ICD-10-CM | POA: Insufficient documentation

## 2016-07-21 DIAGNOSIS — Z79899 Other long term (current) drug therapy: Secondary | ICD-10-CM | POA: Diagnosis not present

## 2016-07-21 DIAGNOSIS — I10 Essential (primary) hypertension: Secondary | ICD-10-CM | POA: Diagnosis not present

## 2016-07-21 DIAGNOSIS — R0602 Shortness of breath: Secondary | ICD-10-CM | POA: Insufficient documentation

## 2016-07-21 DIAGNOSIS — E039 Hypothyroidism, unspecified: Secondary | ICD-10-CM | POA: Insufficient documentation

## 2016-07-21 DIAGNOSIS — M549 Dorsalgia, unspecified: Secondary | ICD-10-CM | POA: Diagnosis not present

## 2016-07-21 DIAGNOSIS — J45909 Unspecified asthma, uncomplicated: Secondary | ICD-10-CM | POA: Diagnosis not present

## 2016-07-21 DIAGNOSIS — R9431 Abnormal electrocardiogram [ECG] [EKG]: Secondary | ICD-10-CM | POA: Diagnosis not present

## 2016-07-21 DIAGNOSIS — L74519 Primary focal hyperhidrosis, unspecified: Secondary | ICD-10-CM | POA: Diagnosis not present

## 2016-07-21 DIAGNOSIS — Z7951 Long term (current) use of inhaled steroids: Secondary | ICD-10-CM | POA: Insufficient documentation

## 2016-07-21 DIAGNOSIS — J449 Chronic obstructive pulmonary disease, unspecified: Secondary | ICD-10-CM | POA: Diagnosis not present

## 2016-07-21 LAB — BASIC METABOLIC PANEL
Anion gap: 8 (ref 5–15)
BUN: 32 mg/dL — ABNORMAL HIGH (ref 6–20)
CO2: 23 mmol/L (ref 22–32)
Calcium: 9.3 mg/dL (ref 8.9–10.3)
Chloride: 103 mmol/L (ref 101–111)
Creatinine, Ser: 1.2 mg/dL — ABNORMAL HIGH (ref 0.44–1.00)
GFR calc Af Amer: 49 mL/min — ABNORMAL LOW (ref >60)
GFR calc non Af Amer: 42 mL/min — ABNORMAL LOW (ref >60)
Glucose, Bld: 134 mg/dL — ABNORMAL HIGH (ref 65–99)
Potassium: 3.9 mmol/L (ref 3.5–5.1)
Sodium: 134 mmol/L — ABNORMAL LOW (ref 135–145)

## 2016-07-21 LAB — CBC
HCT: 36.6 % (ref 35.0–47.0)
Hemoglobin: 12.1 g/dL (ref 12.0–16.0)
MCH: 28.1 pg (ref 26.0–34.0)
MCHC: 33.1 g/dL (ref 32.0–36.0)
MCV: 84.8 fL (ref 80.0–100.0)
Platelets: 462 10*3/uL — ABNORMAL HIGH (ref 150–440)
RBC: 4.31 MIL/uL (ref 3.80–5.20)
RDW: 14.7 % — ABNORMAL HIGH (ref 11.5–14.5)
WBC: 6.8 10*3/uL (ref 3.6–11.0)

## 2016-07-21 LAB — TROPONIN I: Troponin I: <0.03 ng/mL (ref <0.03)

## 2016-07-21 NOTE — ED Notes (Signed)
Patient here for back pain. Patient came in via EMS. Patient has been pain free while in ED. Patient ambulates without pain or difficulty.

## 2016-07-21 NOTE — ED Notes (Signed)
FN: pt to ed via ems with c/o back pain in

## 2016-07-21 NOTE — ED Provider Notes (Signed)
[facility] Emergency Department Provider Note  ____________________________________________   First MD Initiated Contact with Patient 07/21/16 1633     (approximate)  I have reviewed the triage vital signs and the nursing notes.   HISTORY  Chief Complaint Back Pain   HPI Anita Kent is a 76 y.o. female with a history of a left bundle branch block as well as hypertension who is presenting to the emergency department today with sudden onset back pain just before 2 PM this afternoon. She says that she was sitting in her home when she had the back pain. She denies any nausea or vomiting. Says that the pain was so severe that she had some shortness of breath but has not had any other shortness of breath recently. Has not had any exertional symptoms. Said that she did become diaphoretic but does not report any near syncopal feeling or an episode of syncope.  She said that episode lasted 10 minutes and she has been symptom-free ever since. She denies a history of coronary artery disease although she does say there is a history of CAD in her family. Although it is listed in her past medical history she denies a history of high cholesterol and diabetes. She currently has no appointment on June 15 with Dr. Nehemiah Massed because of her left bundle branch block which was discovered during a preoperative workup for a D&C.The patient denies any chest pain during the episode of back pain. She says that the back pain was located in the high thoracic region between her shoulder blades.   Past Medical History:  Diagnosis Date  . Asthma   . COPD (chronic obstructive pulmonary disease) ([place])   . Cough variant asthma   . Diabetes ([place])   . Dyslipidemia   . HTN (hypertension)   . Hypertension   . Hypothyroidism   . Thyroid disease     Patient Active Problem List   Diagnosis Date Noted  . Sinusitis, acute 08/13/2014  . 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 Asthma, chronic  03/06/2014  . Cough 03/06/2014    Past Surgical History:  Procedure Laterality Date  . GANGLION CYST EXCISION Left    hand  . primary open reduction procedure Right 11/09/2013    right ankle    Prior to Admission medications   Medication Sig Start Date End Date Taking? Authorizing Provider  albuterol (PROVENTIL) (2.5 MG/3ML) 0.083% nebulizer solution Take 2.5 mg by nebulization every 6 (six) hours as needed for wheezing or shortness of breath.    [provider]  Albuterol Sulfate (PROAIR RESPICLICK) 144 (90 BASE) MCG/ACT AEPB Inhale 2 puffs into the lungs every 6 (six) hours as needed. 05/16/14   Mungal, Roxanne Mins, MD  Fluticasone-Salmeterol (ADVAIR) 250-50 MCG/DOSE AEPB Inhale 1 puff into the lungs 2 (two) times daily.    [provider]  ibuprofen (ADVIL,MOTRIN) 200 MG tablet Take 400 mg by mouth as needed.     [provider]  LACTOBACILLUS ACID-PECTIN PO Take 1 capsule by mouth daily.    [provider]  levothyroxine (SYNTHROID, LEVOTHROID) 75 MCG tablet Take 75 mcg by mouth daily before breakfast.    [provider]  loratadine (CLARITIN) 10 MG tablet Take 10 mg by mouth daily.    [provider]  losartan-hydrochlorothiazide (HYZAAR) 50-12.5 MG per tablet Take 1 tablet by mouth daily. 02/26/14   [provider]  omeprazole (PRILOSEC OTC) 20 MG tablet Take 20 mg by mouth daily.     [provider]    Allergies Sulfa antibiotics; Amoxicillin; Asa [aspirin]; and Levaquin [levofloxacin in d5w]  No family history on file.  Social History Social History  Substance Use Topics  . Smoking status: Never Smoker  . Smokeless tobacco: Never Used  . Alcohol use No    Review of Systems  Constitutional: No fever/chills Eyes: No visual changes. ENT: No sore throat. Cardiovascular: Denies chest pain. Respiratory: as above Gastrointestinal: No abdominal pain.  No nausea, no vomiting.  No diarrhea.  No  constipation. Genitourinary: Negative for dysuria. Musculoskeletal: as above Skin: Negative for rash. Neurological: Negative for headaches, focal weakness or numbness.   ____________________________________________   PHYSICAL EXAM:  VITAL SIGNS: ED Triage Vitals  Enc Vitals Group     BP 07/21/16 1514 (!) 139/52     Pulse Rate 07/21/16 1514 92     Resp 07/21/16 1514 18     Temp 07/21/16 1514 97.7 F (36.5 C)     Temp Source 07/21/16 1514 Oral     SpO2 07/21/16 1514 100 %     Weight 07/21/16 1515 155 lb (70.3 kg)     Height 07/21/16 1515 5\' 1"  (1.549 m)     Head Circumference --      Peak Flow --      Pain Score 07/21/16 1536 0     Pain Loc --      Pain Edu? --      Excl. in [place]? --     Constitutional: Alert and oriented. Well appearing and in no acute distress. Eyes: Conjunctivae are normal.  Head: Atraumatic. Nose: No congestion/rhinnorhea. Mouth/Throat: Mucous membranes are moist.  Neck: No stridor.   Cardiovascular: Normal rate, regular rhythm. Grossly normal heart sounds.  Good peripheral circulation with equal and intact radial as well as dorsalis pedis pulses. Respiratory: Normal respiratory effort.  No retractions. Lungs CTAB. Gastrointestinal: Soft and nontender. No distention.  Musculoskeletal: No lower extremity tenderness nor edema.  No joint effusions.  No tenderness palpation to the thoracic or lumbar spines. Neurologic:  Normal speech and language. No gross focal neurologic deficits are appreciated. Skin:  Skin is warm, dry and intact. No rash noted. Psychiatric: Mood and affect are normal. Speech and behavior are normal.  ____________________________________________   LABS (all labs ordered are listed, but only abnormal results are displayed)  Labs Reviewed  BASIC METABOLIC PANEL - Abnormal; Notable for the following:       Result Value   Sodium 134 (*)    Glucose, Bld 134 (*)    BUN 32 (*)    Creatinine, Ser 1.20 (*)    GFR calc non Af Amer 42  (*)    GFR calc Af Amer 49 (*)    All other components within normal limits  CBC - Abnormal; Notable for the following:    RDW 14.7 (*)    Platelets 462 (*)    All other components within normal limits  TROPONIN I   ____________________________________________  EKG  ED ECG REPORT I, [place],  Youlanda Roys, the attending physician, personally viewed and interpreted this ECG.   Date: 07/21/2016  EKG Time: 1533  Rate: 83  Rhythm: normal sinus rhythm  Axis: Normal  Intervals:left bundle branch block  ST&T Change: No ST segment elevation or depression. T-wave inversions in 1 and aVL. Left bundle branch block is old and the EKG is without any significant change from 06/23/2016.  ____________________________________________  RADIOLOGY  No acute finding on the chest x-ray. ____________________________________________   PROCEDURES  Procedure(s) performed:   Procedures  Critical Care performed:   ____________________________________________   INITIAL IMPRESSION / ASSESSMENT AND PLAN / ED COURSE  Pertinent labs & imaging results that were available during my care of the patient were reviewed by me and considered in my medical decision making (see chart for details).  Patient is asymptomatic at this time.Atypical presentation for ACS.  Unlikely dissection.  Completely resolved symptoms with normal cxr and pulses that are equal and bilateral in the ues and les.  Also less likely to be PE bc of resolved symptoms and reassuring vitals.  Patient will be discharged at this time. Patient aware of the lab results as well as admission and results and the plan. She will try to have her follow with Dr. Nehemiah Massed moved up to a sooner date.      ____________________________________________   FINAL CLINICAL IMPRESSION(S) / ED DIAGNOSES  Back pain.    NEW MEDICATIONS STARTED DURING THIS VISIT:  New Prescriptions   No medications on file     Note:  This document was prepared  using Dragon voice recognition software and may include unintentional dictation errors.     Orbie Pyo, MD 07/21/16 [phone]

## 2016-07-21 NOTE — ED Triage Notes (Signed)
Patient presents to the ED with sudden pain around 2pm between her shoulder blades that lasted approx. 10 min.  Patient states pain was so severe she became diaphoretic.  Patient denies dizziness and denies chest pain and shortness of breath.  Patient has an appointment with cardiology on the 15th of June.

## 2016-07-21 NOTE — ED Notes (Signed)
Patient not in room at this time. Will assess once patient is in room.

## 2016-07-31 DIAGNOSIS — I6523 Occlusion and stenosis of bilateral carotid arteries: Secondary | ICD-10-CM | POA: Diagnosis not present

## 2016-07-31 DIAGNOSIS — R0602 Shortness of breath: Secondary | ICD-10-CM | POA: Diagnosis not present

## 2016-08-03 DIAGNOSIS — I447 Left bundle-branch block, unspecified: Secondary | ICD-10-CM | POA: Diagnosis not present

## 2016-08-03 DIAGNOSIS — R072 Precordial pain: Secondary | ICD-10-CM | POA: Diagnosis not present

## 2016-08-03 DIAGNOSIS — I10 Essential (primary) hypertension: Secondary | ICD-10-CM | POA: Diagnosis not present

## 2016-08-03 DIAGNOSIS — E782 Mixed hyperlipidemia: Secondary | ICD-10-CM | POA: Diagnosis not present

## 2016-08-16 DIAGNOSIS — C55 Malignant neoplasm of uterus, part unspecified: Secondary | ICD-10-CM

## 2016-08-16 HISTORY — DX: Malignant neoplasm of uterus, part unspecified: C55

## 2016-08-31 DIAGNOSIS — N950 Postmenopausal bleeding: Secondary | ICD-10-CM | POA: Diagnosis not present

## 2016-08-31 DIAGNOSIS — N9489 Other specified conditions associated with female genital organs and menstrual cycle: Secondary | ICD-10-CM | POA: Diagnosis not present

## 2016-09-01 NOTE — H&P (Signed)
Chief Complaint:    Patient ID: Anita Kent is a 76 y.o. female presenting with Pre Op Consulting  on 08/31/2016  HPI:  Anita Kent returns today for continued workup of her postmenopausal bleeding.   First episode in October 2017, with two more between.  Last bleeding episode was last week. She had an ultrasound in May which showed many fibroids, and a mass in the endometrial cavity.  We agreed to proceed with a D&C with hysteroscopy, and was being set up for this, but Cardiac Clearance was delayed, and only recently completed.  She is now clear to proceed.    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  Past Surgical History:  has a past surgical history that includes Fracture surgery (06/26/2011); Cataract extraction; Nasal surgery (2007); Excision of cyst, left thumb (Left, 04/19/14); and Tubal ligation. Family History: family history includes COPD in her mother; Dementia in her mother; Heart failure (age of onset: 26) in her mother; Myasthenia gravis in her daughter; Myocardial Infarction (Heart attack) (age of onset: 33) in her sister; Stroke (age of onset: 63) in her father; Thyroid disease in her son. Social History:  reports that she has never smoked. She has never used smokeless tobacco. She reports that she does not drink alcohol or use drugs. OB/GYN History:  OB History    Gravida Para Term Preterm AB Living   4 4 4     4    SAB TAB Ectopic Molar Multiple Live Births             4      Allergies: is allergic to amoxicillin; easprin [aspirin]; and levaquin [levofloxacin]. Medications:  Current Outpatient Prescriptions:  .  albuterol (PROVENTIL) 2.5 mg /3 mL (0.083 %) nebulizer solution, Take  2.5 mg by nebulization every 6 (six) hours as needed for wheezing or shortness of breath., Disp: , Rfl:  .  albuterol 90 mcg/actuation inhaler, Inhale 2 inhalations into the lungs every 6 (six) hours as needed for Wheezing., Disp: , Rfl:  .  calcium carbonate-vitamin D3 (CALTRATE 600+D) 600 mg(1,500mg ) -400 unit tablet, Take 1 tablet by mouth 2 (two) times daily with meals., Disp: , Rfl:  .  cholecalciferol (VITAMIN D3) 2,000 unit capsule, Take 2,000 Units by mouth once daily., Disp: , Rfl:  .  fluticasone-salmeterol (ADVAIR DISKUS) 250-50 mcg/dose diskus inhaler, Inhale into the lungs., Disp: , Rfl:  .  LACTOBACILLUS ACIDOPHILUS (PROBIOTIC ORAL), Take 1 caplet by mouth once daily., Disp: , Rfl:  .  levothyroxine (SYNTHROID, LEVOTHROID) 75 MCG tablet, Take 1 tablet (75 mcg total) by mouth once daily. Take on an empty stomach with a glass of water at least 30-60 minutes before breakfast., Disp: 90 tablet, Rfl: 1 .  losartan-hydrochlorothiazide (HYZAAR) 50-12.5 mg tablet, TAKE ONE TABLET BY MOUTH ONCE DAILY, Disp: 90 tablet, Rfl: 1 .  omeprazole (PRILOSEC) 20 MG DR capsule, Take 20 mg by mouth once daily., Disp: , Rfl:  .  predniSONE (DELTASONE) 10 MG tablet, 6 po x 2days, decrease by 10mg  qod til gone. (6,6,5,5,4,4,3,3,2,2,1,1), Disp: 42 tablet, Rfl: 0   Review of Systems: No SOB, no palpitations or chest pain, no new lower extremity edema, no nausea or vomiting or bowel or bladder complaints. See HPI for gyn specific ROS.   Exam:     BP 146/60  Pulse 80   Ht 154.9 cm (5\' 1" )   Wt 71.8 kg (158 lb 3.2 oz)   BMI 29.89 kg/m   General: Patient is well-groomed, well-nourished, appears stated age in no acute distress  HEENT: head is atraumatic and normocephalic, trachea is midline, neck is supple with no palpable nodules  CV: Regular rhythm and normal heart rate, no murmur  Pulm: Clear to auscultation throughout lung fields with no wheezing, crackles, or rhonchi. No increased work of  breathing  Abdomen: soft , no mass, non-tender, no rebound tenderness, no hepatomegaly  Pelvic: tanner stage 5 ,   External genitalia: vulva /labia no lesions  Urethra: no prolapse  Vagina: normal physiologic d/c, laxity in vaginal walls  Cervix: no lesions, no cervical motion tenderness, good descent  Uterus: normal size shape and contour, non-tender  Adnexa: no mass,  non-tender    Rectovaginal: External wnl   Impression:   The primary encounter diagnosis was Post-menopausal bleeding. A diagnosis of Endometrial mass was also pertinent to this visit.    Plan:    The patient and I discussed the technical aspects of the procedure including the potential for risks and complications.These include but are not limited to the risk of infection requiring post-operative antibiotics or further procedures.We talked about the risk of injury to adjacent organs including bladder, bowel, ureter, blood vessels or nerves, the need to convert to abdominal surgery, possibleneed for blood transfusion andpostop complications such asthromboembolic or cardiopulmonary complications.All of her questions were answered. Her preoperative exam was completed andthe appropriate consents were signed. She is scheduled to undergo this procedure in the near future.   CHELSEA CRIST WARD, MD

## 2016-09-04 ENCOUNTER — Encounter
Admission: RE | Disposition: A | Discharge status: Home / Self Care | Payer: Self-pay | Source: Ambulatory Visit | Attending: Obstetrics & Gynecology

## 2016-09-04 ENCOUNTER — Ambulatory Visit
Admission: RE | Admit: 2016-09-04 | Discharge: 2016-09-04 | Disposition: A | Discharge status: Home / Self Care | Payer: Medicare Other | Source: Ambulatory Visit | Attending: Obstetrics & Gynecology | Admitting: Obstetrics & Gynecology

## 2016-09-04 ENCOUNTER — Ambulatory Visit: Payer: Medicare Other | Admitting: Anesthesiology

## 2016-09-04 ENCOUNTER — Encounter: Payer: Self-pay | Admitting: *Deleted

## 2016-09-04 DIAGNOSIS — E039 Hypothyroidism, unspecified: Secondary | ICD-10-CM | POA: Diagnosis not present

## 2016-09-04 DIAGNOSIS — D251 Intramural leiomyoma of uterus: Secondary | ICD-10-CM | POA: Diagnosis not present

## 2016-09-04 DIAGNOSIS — Z880 Allergy status to penicillin: Secondary | ICD-10-CM | POA: Diagnosis not present

## 2016-09-04 DIAGNOSIS — C541 Malignant neoplasm of endometrium: Secondary | ICD-10-CM | POA: Diagnosis not present

## 2016-09-04 DIAGNOSIS — C5701 Malignant neoplasm of right fallopian tube: Secondary | ICD-10-CM | POA: Insufficient documentation

## 2016-09-04 DIAGNOSIS — C539 Malignant neoplasm of cervix uteri, unspecified: Secondary | ICD-10-CM | POA: Insufficient documentation

## 2016-09-04 DIAGNOSIS — J449 Chronic obstructive pulmonary disease, unspecified: Secondary | ICD-10-CM | POA: Diagnosis not present

## 2016-09-04 DIAGNOSIS — Z79899 Other long term (current) drug therapy: Secondary | ICD-10-CM | POA: Insufficient documentation

## 2016-09-04 DIAGNOSIS — N950 Postmenopausal bleeding: Secondary | ICD-10-CM | POA: Insufficient documentation

## 2016-09-04 DIAGNOSIS — I10 Essential (primary) hypertension: Secondary | ICD-10-CM | POA: Diagnosis not present

## 2016-09-04 DIAGNOSIS — E669 Obesity, unspecified: Secondary | ICD-10-CM | POA: Insufficient documentation

## 2016-09-04 DIAGNOSIS — Z6830 Body mass index (BMI) 30.0-30.9, adult: Secondary | ICD-10-CM | POA: Insufficient documentation

## 2016-09-04 HISTORY — PX: LAPAROSCOPIC BILATERAL SALPINGO OOPHERECTOMY: SHX5890

## 2016-09-04 HISTORY — PX: CYSTOSCOPY: SHX5120

## 2016-09-04 HISTORY — PX: DILATATION & CURETTAGE/HYSTEROSCOPY WITH MYOSURE: SHX6511

## 2016-09-04 HISTORY — PX: LAPAROSCOPIC HYSTERECTOMY: SHX1926

## 2016-09-04 LAB — TYPE AND SCREEN
ABO/RH(D): A POS
Antibody Screen: NEGATIVE

## 2016-09-04 SURGERY — DILATATION & CURETTAGE/HYSTEROSCOPY WITH MYOSURE
Anesthesia: General

## 2016-09-04 MED ORDER — OXYCODONE HCL 5 MG/5ML PO SOLN: 5.0000 mg | Freq: Once | ORAL | Status: AC | PRN

## 2016-09-04 MED ORDER — SUGAMMADEX SODIUM 200 MG/2ML IV SOLN
INTRAVENOUS | Status: AC
  Filled 2016-09-04: qty 2

## 2016-09-04 MED ORDER — OXYCODONE HCL 5 MG PO TABS: 5.0000 mg | ORAL_TABLET | ORAL | 0 refills | Status: DC | PRN

## 2016-09-04 MED ORDER — OXYCODONE HCL 5 MG PO TABS
ORAL_TABLET | ORAL | Status: AC
  Administered 2016-09-04: 5 mg via ORAL
  Filled 2016-09-04: qty 1

## 2016-09-04 MED ORDER — PROMETHAZINE HCL 25 MG/ML IJ SOLN: 6.2500 mg | INTRAMUSCULAR | Status: DC | PRN

## 2016-09-04 MED ORDER — FENTANYL CITRATE (PF) 100 MCG/2ML IJ SOLN
INTRAMUSCULAR | Status: AC
  Filled 2016-09-04: qty 2

## 2016-09-04 MED ORDER — PHENYLEPHRINE HCL 10 MG/ML IJ SOLN
INTRAMUSCULAR | Status: AC
  Filled 2016-09-04: qty 1

## 2016-09-04 MED ORDER — ROCURONIUM BROMIDE 100 MG/10ML IV SOLN
INTRAVENOUS | Status: DC | PRN
  Administered 2016-09-04: 30 mg via INTRAVENOUS

## 2016-09-04 MED ORDER — PROPOFOL 10 MG/ML IV BOLUS
INTRAVENOUS | Status: DC | PRN
  Administered 2016-09-04: 140 mg via INTRAVENOUS

## 2016-09-04 MED ORDER — LACTATED RINGERS IV SOLN
INTRAVENOUS | Status: DC
  Administered 2016-09-04 (×2): via INTRAVENOUS

## 2016-09-04 MED ORDER — PHENYLEPHRINE HCL 10 MG/ML IJ SOLN
INTRAMUSCULAR | Status: DC | PRN
  Administered 2016-09-04: 100 ug via INTRAVENOUS
  Administered 2016-09-04 (×4): 50 ug via INTRAVENOUS
  Administered 2016-09-04 (×2): 100 ug via INTRAVENOUS
  Administered 2016-09-04: 50 ug via INTRAVENOUS

## 2016-09-04 MED ORDER — IBUPROFEN 600 MG PO TABS: 600.0000 mg | ORAL_TABLET | Freq: Four times a day (QID) | ORAL | 1 refills | Status: DC

## 2016-09-04 MED ORDER — LIDOCAINE HCL (CARDIAC) 20 MG/ML IV SOLN
INTRAVENOUS | Status: DC | PRN
  Administered 2016-09-04: 50 mg via INTRAVENOUS

## 2016-09-04 MED ORDER — LACTATED RINGERS IR SOLN
Status: DC | PRN
  Administered 2016-09-04: 1000 mL

## 2016-09-04 MED ORDER — ACETAMINOPHEN 10 MG/ML IV SOLN
INTRAVENOUS | Status: DC | PRN
  Administered 2016-09-04: 1000 mg via INTRAVENOUS

## 2016-09-04 MED ORDER — PROPOFOL 10 MG/ML IV BOLUS
INTRAVENOUS | Status: AC
  Filled 2016-09-04: qty 20

## 2016-09-04 MED ORDER — ONDANSETRON HCL 4 MG/2ML IJ SOLN
INTRAMUSCULAR | Status: AC
  Filled 2016-09-04: qty 2

## 2016-09-04 MED ORDER — KETOROLAC TROMETHAMINE 30 MG/ML IJ SOLN
INTRAMUSCULAR | Status: AC
  Filled 2016-09-04: qty 1

## 2016-09-04 MED ORDER — LIDOCAINE HCL (PF) 2 % IJ SOLN
INTRAMUSCULAR | Status: AC
  Filled 2016-09-04: qty 2

## 2016-09-04 MED ORDER — ONDANSETRON HCL 4 MG/2ML IJ SOLN
INTRAMUSCULAR | Status: DC | PRN
  Administered 2016-09-04: 4 mg via INTRAVENOUS

## 2016-09-04 MED ORDER — FENTANYL CITRATE (PF) 100 MCG/2ML IJ SOLN
25.0000 ug | INTRAMUSCULAR | Status: DC | PRN
  Administered 2016-09-04 (×4): 25 ug via INTRAVENOUS

## 2016-09-04 MED ORDER — SEVOFLURANE IN SOLN
RESPIRATORY_TRACT | Status: AC
  Filled 2016-09-04: qty 250

## 2016-09-04 MED ORDER — ACETAMINOPHEN NICU IV SYRINGE 10 MG/ML
INTRAVENOUS | Status: AC
  Filled 2016-09-04: qty 1

## 2016-09-04 MED ORDER — MEPERIDINE HCL 50 MG/ML IJ SOLN: 6.2500 mg | INTRAMUSCULAR | Status: DC | PRN

## 2016-09-04 MED ORDER — FENTANYL CITRATE (PF) 100 MCG/2ML IJ SOLN
INTRAMUSCULAR | Status: DC | PRN
  Administered 2016-09-04 (×4): 25 ug via INTRAVENOUS

## 2016-09-04 MED ORDER — OXYCODONE HCL 5 MG PO TABS
5.0000 mg | ORAL_TABLET | Freq: Once | ORAL | Status: AC | PRN
  Administered 2016-09-04: 5 mg via ORAL

## 2016-09-04 MED ORDER — SUCCINYLCHOLINE CHLORIDE 20 MG/ML IJ SOLN
INTRAMUSCULAR | Status: DC | PRN
  Administered 2016-09-04: 100 mg via INTRAVENOUS

## 2016-09-04 SURGICAL SUPPLY — 37 items
ABLATOR ENDOMETRIAL MYOSURE (ABLATOR) IMPLANT
CANISTER SUC SOCK COL 7IN (MISCELLANEOUS) ×5 IMPLANT
CATH ROBINSON RED A/P 16FR (CATHETERS) ×5 IMPLANT
DEVICE MYOSURE LITE (MISCELLANEOUS) ×5 IMPLANT
ELECT REM PT RETURN 9FT ADLT (ELECTROSURGICAL) ×5
ELECTRODE REM PT RTRN 9FT ADLT (ELECTROSURGICAL) ×3 IMPLANT
GLOVE PI ORTHOPRO 6.5 (GLOVE) ×2
GLOVE PI ORTHOPRO STRL 6.5 (GLOVE) ×3 IMPLANT
GLOVE SURG SYN 6.5 ES PF (GLOVE) ×10 IMPLANT
GOWN STRL REUS W/ TWL LRG LVL3 (GOWN DISPOSABLE) ×6 IMPLANT
GOWN STRL REUS W/TWL LRG LVL3 (GOWN DISPOSABLE) ×4
IRRIGATION STRYKERFLOW (MISCELLANEOUS) ×3 IMPLANT
IRRIGATOR STRYKERFLOW (MISCELLANEOUS) ×5
IV LACTATED RINGER IRRG 3000ML (IV SOLUTION) ×2
IV LR IRRIG 3000ML ARTHROMATIC (IV SOLUTION) ×3 IMPLANT
IV SOD CHL 0.9% 1000ML (IV SOLUTION) ×10 IMPLANT
KIT RM TURNOVER CYSTO AR (KITS) ×5 IMPLANT
L-HOOK LAP DISP 36CM (ELECTROSURGICAL) ×5
LHOOK LAP DISP 36CM (ELECTROSURGICAL) ×3 IMPLANT
LIGASURE VESSEL 5MM BLUNT TIP (ELECTROSURGICAL) ×5 IMPLANT
MANIPULATOR VCARE LG CRV RETR (MISCELLANEOUS) ×5 IMPLANT
PACK DNC HYST (MISCELLANEOUS) ×5 IMPLANT
PAD OB MATERNITY 4.3X12.25 (PERSONAL CARE ITEMS) ×5 IMPLANT
PAD PREP 24X41 OB/GYN DISP (PERSONAL CARE ITEMS) ×5 IMPLANT
PENCIL ELECTRO HAND CTR (MISCELLANEOUS) ×5 IMPLANT
SEAL ROD LENS SCOPE MYOSURE (ABLATOR) ×5 IMPLANT
SET CYSTO W/LG BORE CLAMP LF (SET/KITS/TRAYS/PACK) ×5 IMPLANT
SOL .9 NS 3000ML IRR  AL (IV SOLUTION) ×2
SOL .9 NS 3000ML IRR UROMATIC (IV SOLUTION) ×3 IMPLANT
SUT MNCRL 4-0 (SUTURE) ×2
SUT MNCRL 4-018XMFL (SUTURE) ×3
SUT VIC AB 0 CT1 36 (SUTURE) ×10 IMPLANT
SUTURE MNCRL 4-018XMF (SUTURE) ×3 IMPLANT
TRAY FOLEY W/METER SILVER 16FR (SET/KITS/TRAYS/PACK) ×5 IMPLANT
TUBING CONNECTING 10 (TUBING) ×4 IMPLANT
TUBING CONNECTING 10' (TUBING) ×1
TUBING HYSTEROSCOPY DOLPHIN (MISCELLANEOUS) ×5 IMPLANT

## 2016-09-04 NOTE — Anesthesia Procedure Notes (Signed)
Procedure Name: Intubation Performed by: Lance Muss Pre-anesthesia Checklist: Patient identified, Patient being monitored, Timeout performed, Emergency Drugs available and Suction available Patient Re-evaluated:Patient Re-evaluated prior to induction Oxygen Delivery Method: Circle system utilized Preoxygenation: Pre-oxygenation with 100% oxygen Induction Type: IV induction Ventilation: Mask ventilation without difficulty Laryngoscope Size: 3 and McGraph Grade View: Grade II Tube type: Oral Tube size: 7.0 mm Number of attempts: 1 Airway Equipment and Method: Stylet Placement Confirmation: ETT inserted through vocal cords under direct vision,  positive ETCO2 and breath sounds checked- equal and bilateral Secured at: 21 cm Tube secured with: Tape Dental Injury: Teeth and Oropharynx as per pre-operative assessment  Difficulty Due To: Difficult Airway- due to anterior larynx and Difficult Airway- due to reduced neck mobility Future Recommendations: Recommend- induction with short-acting agent, and alternative techniques readily available Comments: Recommend using videolaryngoscopy first.

## 2016-09-04 NOTE — Interval H&P Note (Signed)
History and Physical Interval Note:  09/04/2016 7:17 AM  Anita Kent  has presented today for surgery, with the diagnosis of PMB  The various methods of treatment have been discussed with the patient and family. After consideration of risks, benefits and other options for treatment, the patient has consented to  Procedure(s): [place] (N/A) as a surgical intervention .  The patient's history has been reviewed, patient examined, no change in status, stable for surgery.  I have reviewed the patient's chart and labs.  Questions were answered to the patient's satisfaction.     [place]

## 2016-09-04 NOTE — Anesthesia Preprocedure Evaluation (Signed)
Anesthesia Evaluation  Patient identified by MRN, date of birth, ID band Patient awake    Reviewed: Allergy & Precautions, NPO status , Patient's Chart, lab work & pertinent test results  History of Anesthesia Complications Negative for: history of anesthetic complications  Airway Mallampati: II  TM Distance: >3 FB Neck ROM: Full    Dental no notable dental hx.    Pulmonary asthma , neg sleep apnea, COPD,  COPD inhaler,    breath sounds clear to auscultation- rhonchi (-) wheezing      Cardiovascular Exercise Tolerance: Good hypertension, Pt. on medications (-) CAD, (-) Past MI and (-) Cardiac Stents  Rhythm:Regular Rate:Normal - Systolic murmurs and - Diastolic murmurs    Neuro/Psych negative neurological ROS  negative psych ROS   GI/Hepatic negative GI ROS, Neg liver ROS,   Endo/Other  diabetesHypothyroidism   Renal/GU negative Renal ROS     Musculoskeletal negative musculoskeletal ROS (+)   Abdominal (+) + obese,   Peds  Hematology negative hematology ROS (+)   Anesthesia Other Findings Past Medical History: No date: Asthma No date: COPD (chronic obstructive pulmonary disease) (HCC) No date: Cough variant asthma No date: Diabetes (HCC)     Comment:  states was told she was not diabetic tested high once on              hemoglobin A1C No date: Dyslipidemia No date: HTN (hypertension) No date: Hypertension No date: Hypothyroidism No date: Thyroid disease   Reproductive/Obstetrics                             Anesthesia Physical Anesthesia Plan  ASA: II  Anesthesia Plan: General   Post-op Pain Management:    Induction: Intravenous  PONV Risk Score and Plan: 2 and Ondansetron and Dexamethasone  Airway Management Planned: LMA  Additional Equipment:   Intra-op Plan:   Post-operative Plan:   Informed Consent: I have reviewed the patients History and Physical, chart, labs  and discussed the procedure including the risks, benefits and alternatives for the proposed anesthesia with the patient or authorized representative who has indicated his/her understanding and acceptance.   Dental advisory given  Plan Discussed with: CRNA and Anesthesiologist  Anesthesia Plan Comments:         Anesthesia Quick Evaluation

## 2016-09-04 NOTE — Anesthesia Post-op Follow-up Note (Cosign Needed)
Anesthesia QCDR form completed.        

## 2016-09-04 NOTE — Anesthesia Postprocedure Evaluation (Signed)
Anesthesia Post Note  Patient: ZYAN MIRKIN  Procedure(s) Performed: Procedure(s) (LRB): DILATATION & CURETTAGE/HYSTEROSCOPY WITH MYOSURE (N/A) HYSTERECTOMY TOTAL LAPAROSCOPIC LAPAROSCOPIC BILATERAL SALPINGO OOPHORECTOMY (Bilateral) CYSTOSCOPY (N/A)  Patient location during evaluation: PACU Anesthesia Type: General Level of consciousness: awake and alert and oriented Pain management: pain level controlled Vital Signs Assessment: post-procedure vital signs reviewed and stable Respiratory status: spontaneous breathing, nonlabored ventilation and respiratory function stable Cardiovascular status: blood pressure returned to baseline and stable Postop Assessment: no signs of nausea or vomiting Anesthetic complications: no     Last Vitals:  Vitals:   09/04/16 1048 09/04/16 1102  BP: (!) 120/47 (!) 139/56  Pulse: 75 70  Resp: 14 16  Temp: (!) 36.2 C (!) 35.9 C    Last Pain:  Vitals:   09/04/16 1102  TempSrc: Temporal  PainSc: 7                  Renne Cornick

## 2016-09-04 NOTE — Transfer of Care (Signed)
Immediate Anesthesia Transfer of Care Note  Patient: Anita Kent  Procedure(s) Performed: Procedure(s): DILATATION & CURETTAGE/HYSTEROSCOPY WITH MYOSURE (N/A) HYSTERECTOMY TOTAL LAPAROSCOPIC LAPAROSCOPIC BILATERAL SALPINGO OOPHORECTOMY (Bilateral) CYSTOSCOPY (N/A)  Patient Location: PACU  Anesthesia Type:General  Level of Consciousness: awake and responds to stimulation  Airway & Oxygen Therapy: Patient Spontanous Breathing and Patient connected to face mask oxygen  Post-op Assessment: Report given to RN and Post -op Vital signs reviewed and stable  Post vital signs: Reviewed and stable  Last Vitals:  Vitals:   09/04/16 1016 09/04/16 1020  BP: (!) 121/49 (!) 121/49  Pulse: 73 73  Resp: 16 16  Temp: 36.6 C     Last Pain:  Vitals:   09/04/16 0605  TempSrc: Oral         Complications: No apparent anesthesia complications

## 2016-09-04 NOTE — Discharge Instructions (Signed)
Discharge instructions:  Call office if you have any of the following: fever >101 F, chills, excessive vaginal bleeding, incision drainage or problems, leg pain or redness, or any other concerns.   Activity: Do not lift > 10 lbs for 8 weeks.  No intercourse or tampons for 8 weeks.  No driving for 1-2 weeks.   You may feel some pain in your upper right abdomen/rib and right shoulder.  This is from the gas in the abdomen for surgery. This will subside over time, please be patient!  Take 600mg  Ibuprofen and 1000mg  Tylenol around the clock, every 6 hours for at least the first 3-5 days.  After this you can take as needed.  This will help decrease inflammation and promote healing.  The narcotics you'll take just as needed, as they just trick your brain into thinking its not in pain.    Please don't limit yourself in terms of routine activity.  You will be able to do most things, although they may take longer to do or be a little painful.  You can do it!  Don't be a hero, but don't be a wimp either!    AMBULATORY SURGERY  DISCHARGE INSTRUCTIONS   1) The drugs that you were given will stay in your system until tomorrow so for the next 24 hours you should not:  A) Drive an automobile B) Make any legal decisions C) Drink any alcoholic beverage   2) You may resume regular meals tomorrow.  Today it is better to start with liquids and gradually work up to solid foods.  You may eat anything you prefer, but it is better to start with liquids, then soup and crackers, and gradually work up to solid foods.   3) Please notify your doctor immediately if you have any unusual bleeding, trouble breathing, redness and pain at the surgery site, drainage, fever, or pain not relieved by medication.   4) Additional Instructions: Splint abdominal incisions with a small towel or pillow when coughing, sneezing or moving.

## 2016-09-04 NOTE — Anesthesia Procedure Notes (Signed)
Procedure Name: LMA Insertion Performed by: Vernor Monnig Pre-anesthesia Checklist: Patient identified, Patient being monitored, Timeout performed, Emergency Drugs available and Suction available Patient Re-evaluated:Patient Re-evaluated prior to induction Oxygen Delivery Method: Circle system utilized Preoxygenation: Pre-oxygenation with 100% oxygen Induction Type: IV induction LMA: LMA inserted LMA Size: 3.5 Tube type: Oral Number of attempts: 1 Placement Confirmation: positive ETCO2 and breath sounds checked- equal and bilateral Tube secured with: Tape Dental Injury: Teeth and Oropharynx as per pre-operative assessment        

## 2016-09-05 NOTE — Op Note (Signed)
Operative Report Hysteroscopy, Dilation and Curettage 09/04/2016  Patient:  Anita Kent  76 y.o. female Preoperative diagnosis:  PMB Postoperative diagnosis:  PMB  PROCEDURE:  Procedure(s): DILATATION & CURETTAGE/HYSTEROSCOPY WITH MYOSURE (N/A) HYSTERECTOMY TOTAL LAPAROSCOPIC LAPAROSCOPIC BILATERAL SALPINGO OOPHORECTOMY (Bilateral) CYSTOSCOPY (N/A)   Surgeon:  Surgeon(s) and Role:    * Evia Goldsmith, Honor Loh, MD - Primary    * Schermerhorn, Gwen Her, MD - Assisting Anesthesia:  LMA converted to GET I/O: Total I/O In: 8413 [P.O.:120; I.V.:1100] Out: 330 [Urine:80; Blood:250]  Fluid deficit from hysteroscopy: 400cc Specimens:  Endometrial curettings, uterus cervix, bilateral tubes and ovaries. Complications: None Apparent Disposition:  VS stable to PACU  Findings: D&C: Uterus, mobile, normal size with grade 2 descent, sounding to 10 cm; normal cervix, atrophic vagina without lesions, normal perineum. Difficult visualization due slight but active bleeding, no discreet mass able to be identified, but fluffy tissue with prominent blood vessels seen.  Moderate tissue on curettage.   Upon re-entry with myoscope, one small lobe of fatty tissue identified, at the same time the fluid deficit instantly and dramatically increased.  Laparoscope: upon entry to the abdomen, about 400cc of thin bloody fluid was encountered.  The uterus was observed to have a perforation in the right cornua that was oozing, and would bleed with any manipulation.  From that defect was endometrial tissue protruding and falling into the abdomen.  Otherwise unremarkable pelvis and upper abdomen.  Bilateral tubes had been surgically altered, and bladder was thinly adherent to anterior LUS.  NO nodularity on peritoneal surfaces.   Indication for procedure/Consents: 76 y.o. here for scheduled surgery after presenting for workup for PMB. Risks of surgery were discussed with the patient including but not limited to: bleeding  which may require transfusion; infection which may require antibiotics; injury to uterus or surrounding organs; perforation of uterus; need for additional procedures including laparotomy or laparoscopy; and other postoperative/anesthesia complications. Written informed consent was obtained.    Procedure Details:   The patient was then taken to the operating room where anesthesia was administered and was found to be adequate.  She was placed in the dorsal lithotomy position and examined with the above findings. She was then prepped and draped in the sterile manner.  A surgical time-out was performed. A speculum was then placed in the patient's vagina and a single tooth tenaculum was applied to the posterior lip of the cervix.    The uterus was sounded to 10cm. Her cervix was serially dilated to accommodate the myoscope, with findings as above. There was limited visibility, due to consistent blood fluid. The pressure was increased, yet no adequate visibility was achieved. The myosure device, when used, often clears the fluid well, so it was introduced and very briefly employed but visibility was again not well achieved.   A sharp curettage was then performed, with moderate tissue returned.  I re-introduced the myoscope and visibilty was improved but still not optimal.  As I was navigating the surfaces, a tiny bleb of fatty tissue was noted at the same time the fluid deficit changed from 0 to 200 and increasing rapidly.  I stopped the infusion of fluid and the total loss was near 400cc. The instruments were removed. The specimen was handed off to nursing.  The decision was then made to convert to diagnostic laparoscopy to evaluate the extent of the presumed perforation.  The LMA was converted to endotracheal tube.  She was repositioned from the candy-cane stirrups to [place], her abdomen prepped  and after re-scrubbing, she was draped in a sterile manner. Another time-out was performed. An umbilical  incision was made with the scalpel.  A 24mm trochar was inserted in the umbilical incision using a visiport method.Opening pressure was 24mHg, and the abdomen was insufflated to 15mg Hg carbon dioxide gas and adequate pneumoperitoneum was obtained. A survey of the patient's pelvis and abdomen revealed the perforation and tissue, and the decision was made to proceed with hysterectomy at that time. Dr. Ouida Sills was called to assist.   Two 106mm ports were inserted in the lower left and right quadrants under visualization. Bilateral ureters were identified and vermiculating.  The bilateral round ligaments were transected with the Ligasure, lateral peritoneum divided, posterior broad ligament opened, isolating the bilateral IP ligaments.  These were each thrice cauterized, then divided with the Ligasure. The pedicles were hemostatic. The bilateral anterior broad ligaments were divided and brought across the uterus to separate the vesicouterine peritoneum and create a bladder flap. The bladder was pushed away from the uterus. The bilateral uterine arteries were skeletonized, ligated and transected. The bilateral uterosacral and cardinal ligaments were ligated and transected. A colpotomy was made around the V-Care cervical cup and the uterus, cervix, and bilateral tubes were removed through the vagina. The vaginal cuff was closed vaginally using 0-Vicryl in a running locking stitch. This was tested for integrity using the surgeon's finger. After a change of gloves, the pneumoperitoneum was recreated and surgical site inspected, and found to be hemostatic. Bilateral ureters were visualized vermiuclating. No intraoperative injury to surrounding organs was noted. A total 2L of fluid was intermittently irrigated into the abdomen, with various manipulation of position and the bowel was performed to identify and remove any tissue that had been introduced through the uterine perforation. After satisfactorily concluding  that any visible tissue had been removed, the abdomen was desufflated and all instruments were then removed.  The attention was turned to the bladder.  A 70-degree cystoscope was inserted into the urethra after the foley was removed.  The bladder was inflated with 300cc of normal saline, and inspection of the bladder wall was performed.  Bilateral ureteral jets were observed.  The cystoscope was removed.  All skin incisions were closed with 4-0 monocryl and covered with surgical glue. The patient tolerated the procedures well.  All instruments, needles, and sponge counts were correct x 2. The patient was taken to the recovery room in stable condition.   ---- Larey Days, MD Attending Obstetrician and [facility]

## 2016-09-07 ENCOUNTER — Encounter: Payer: Self-pay | Admitting: Obstetrics & Gynecology

## 2016-09-07 ENCOUNTER — Other Ambulatory Visit: Payer: Self-pay

## 2016-09-14 LAB — SURGICAL PATHOLOGY

## 2016-09-16 ENCOUNTER — Inpatient Hospital Stay: Payer: Medicare Other

## 2016-09-16 ENCOUNTER — Encounter: Payer: Self-pay | Admitting: Obstetrics and Gynecology

## 2016-09-16 ENCOUNTER — Other Ambulatory Visit: Payer: Self-pay | Admitting: *Deleted

## 2016-09-16 ENCOUNTER — Inpatient Hospital Stay: Payer: Medicare Other | Attending: Obstetrics and Gynecology | Admitting: Obstetrics and Gynecology

## 2016-09-16 DIAGNOSIS — E119 Type 2 diabetes mellitus without complications: Secondary | ICD-10-CM | POA: Diagnosis not present

## 2016-09-16 DIAGNOSIS — Z79899 Other long term (current) drug therapy: Secondary | ICD-10-CM | POA: Insufficient documentation

## 2016-09-16 DIAGNOSIS — E039 Hypothyroidism, unspecified: Secondary | ICD-10-CM | POA: Insufficient documentation

## 2016-09-16 DIAGNOSIS — C541 Malignant neoplasm of endometrium: Secondary | ICD-10-CM | POA: Diagnosis not present

## 2016-09-16 DIAGNOSIS — Z90710 Acquired absence of both cervix and uterus: Secondary | ICD-10-CM | POA: Insufficient documentation

## 2016-09-16 DIAGNOSIS — Z90722 Acquired absence of ovaries, bilateral: Secondary | ICD-10-CM | POA: Diagnosis not present

## 2016-09-16 DIAGNOSIS — Z780 Asymptomatic menopausal state: Secondary | ICD-10-CM | POA: Insufficient documentation

## 2016-09-16 DIAGNOSIS — E785 Hyperlipidemia, unspecified: Secondary | ICD-10-CM | POA: Insufficient documentation

## 2016-09-16 DIAGNOSIS — I10 Essential (primary) hypertension: Secondary | ICD-10-CM | POA: Insufficient documentation

## 2016-09-16 DIAGNOSIS — J449 Chronic obstructive pulmonary disease, unspecified: Secondary | ICD-10-CM | POA: Diagnosis not present

## 2016-09-16 LAB — CREATININE, SERUM
Creatinine, Ser: 1.34 mg/dL — ABNORMAL HIGH (ref 0.44–1.00)
GFR calc Af Amer: 43 mL/min — ABNORMAL LOW (ref >60)
GFR calc non Af Amer: 37 mL/min — ABNORMAL LOW (ref >60)

## 2016-09-16 NOTE — Progress Notes (Signed)
  Oncology Nurse Navigator Documentation Chaperoned pelvic exam. Referral order placed for Radiation Ocology. CT CAP orders placed. Navigator Location: [facility] (09/16/16 1500)   )Navigator Encounter Type: Initial GynOnc (09/16/16 1500)                     Patient Visit Type: GynOnc (09/16/16 1500)                    Acuity: Level 2 (09/16/16 1500)   Acuity Level 2: Initial guidance, education and coordination as needed;Ongoing guidance and education throughout treatment as needed;Assistance expediting appointments (09/16/16 1500)     Time Spent with Patient: 30 (09/16/16 1500)

## 2016-09-16 NOTE — Progress Notes (Signed)
Pt with new dx, no pain. Here to decide on a treatment if needed.

## 2016-09-16 NOTE — Addendum Note (Signed)
Addended by: Mariea Clonts D on: 09/16/2016 04:03 PM   Modules accepted: Orders

## 2016-09-16 NOTE — Progress Notes (Signed)
Gynecologic Oncology Consult Visit   Referring Provider: Dr. Vikki Ports Ward  Chief Concern: Endometrial cancer  Subjective:  Anita Kent is a 76 y.o. P4 female who is seen in consultation from Dr. Leonides Schanz for endometrial cancer.  Menopause age 17, no hormone replacement. Postmenopausal bleeding episodes noted October 2017 at primary care visit and seen eventually by Dr Leonides Schanz. She had an ultrasound in May 2018 which showed many fibroids, and a mass in the endometrial cavity.  D&C with hysteroscopy was being set up, but Cardiac Clearance was delayed  D&C 09/04/16: Uterus, mobile, normal size with grade 2 descent, sounding to 10 cm; normal cervix, atrophic vagina without lesions, normal perineum. Difficult visualization due to active bleeding, no discreet mass able to be identified, but fluffy tissue with prominent blood vessels seen.  Moderate tissue on curettage.  Upon re-entry with scope, one small lobe of fatty tissue identified, at the same time the fluid deficit instantly and dramatically increased.  In view of this she had laparoscopy and upon entry to the abdomen, about 400cc of thin bloody fluid was encountered.  The uterus was observed to have a perforation in the right cornua that was oozing, and would bleed with any manipulation.  From that defect was endometrial tissue protruding and falling into the abdomen.  Otherwise unremarkable pelvis and upper abdomen.  Bilateral tubes had been surgically altered, and bladder was thinly adherent to anterior LUS.  NO nodularity on peritoneal surfaces.  TLH/BSO performed in view of perforation and probable cancer.   Pathology 09/04/16 DIAGNOSIS:  A. ENDOMETRIUM; CURETTAGE:  - ENDOMETRIOID CARCINOMA, FIGO GRADE 1.   B. UTERUS WITH CERVIX; HYSTERECTOMY:  - ENDOMETRIOID CARCINOMA, FIGO GRADE 1, WITH DEEP MYOMETRIAL INVASION.  - NO DEFINITE CERVICAL INVOLVEMENT OR SEROSAL INVOLVEMENT.  - INTRAMURAL LEIOMYOMAS.   RIGHT FALLOPIAN TUBE; SALPINGECTOMY:  -  INVOLVED BY ENDOMETRIOID CARCINOMA WITH MUSCLE INVASION.   LEFT FALLOPIAN TUBE; SALPINGECTOMY:  - NEGATIVE FOR MALIGNANCY.   RIGHT AND LEFT OVARIES; OOPHORECTOMY:  - NEGATIVE FOR MALIGNANCY.   Myometrial Invasion: Present    Depth of invasion: 15.5 mm    Myometrial thickness: 16 mm    Percentage of myometrial invasion; 97%    Distance from serosa at deepest invasion: 0.5 mm  Uterine Serosa Involvement: Not identified  Cervical Stromal Involvement: Not identified  Other Tissue/Organ Involvement: Right fallopian tube  Peritoneal/ Ascitic Fluid: Not submitted  Lymphovascular Invasion: Not identified  Regional Lymph Nodes: Not submitted  Pathologic Stage Classification (pTNM, AJCC 8th Edition): pT3a pNX/  FIGO IIIA    Doing well post op.  Comes for visit to discuss options for adjuvant therapy.  Problem List: Patient Active Problem List   Diagnosis Date Noted  . Endometrial cancer, FIGO stage IIIA ([place]) 09/16/2016  . Sinusitis, acute 08/13/2014  . Emphysema lung ([place]) 05/08/2014  . Asthma, chronic 03/06/2014  . Cough 03/06/2014    Past Medical History: Past Medical History:  Diagnosis Date  . Asthma   . COPD (chronic obstructive pulmonary disease) ([place])   . Cough variant asthma   . Diabetes ([place])    states was told she was not diabetic tested high once on hemoglobin A1C  . Dyslipidemia   . HTN (hypertension)   . Hypertension   . Hypothyroidism   . Thyroid disease     Past Surgical History: Past Surgical History:  Procedure Laterality Date  . CATARACT EXTRACTION, BILATERAL    . CYSTOSCOPY N/A 09/04/2016   Procedure: CYSTOSCOPY;  Surgeon: Ward, Honor Loh, MD;  Location: [facility] ORS;  Service: Gynecology;  Laterality: N/A;  . DILATATION & CURETTAGE/HYSTEROSCOPY WITH MYOSURE N/A 09/04/2016   Procedure: DILATATION & CURETTAGE/HYSTEROSCOPY WITH MYOSURE;  Surgeon: Ward, Honor Loh, MD;  Location: [facility] ORS;  Service: Gynecology;  Laterality: N/A;  . GANGLION CYST  EXCISION Left    hand  . LAPAROSCOPIC BILATERAL SALPINGO OOPHERECTOMY Bilateral 09/04/2016   Procedure: LAPAROSCOPIC BILATERAL SALPINGO OOPHORECTOMY;  Surgeon: Ward, Honor Loh, MD;  Location: [facility] ORS;  Service: Gynecology;  Laterality: Bilateral;  . LAPAROSCOPIC HYSTERECTOMY  09/04/2016   Procedure: HYSTERECTOMY TOTAL LAPAROSCOPIC;  Surgeon: Ward, Honor Loh, MD;  Location: [facility] ORS;  Service: Gynecology;;  . primary open reduction procedure Right 11/09/2013    right ankle  . TONSILLECTOMY    . TUBAL LIGATION        Family History: Family History  Problem Relation Age of Onset  . Myasthenia gravis Daughter     Social History: Social History   Social History  . Marital status: Married    Spouse name: N/A  . Number of children: N/A  . Years of education: N/A   Occupational History  . Not on file.   Social History Main Topics  . Smoking status: Never Smoker  . Smokeless tobacco: Never Used  . Alcohol use No  . Drug use: No  . Sexual activity: No   Other Topics Concern  . Not on file   Social History Narrative  . No narrative on file    Allergies: Allergies  Allergen Reactions  . Sulfa Antibiotics Hives  . Amoxicillin Hives and Rash    Has patient had a PCN reaction causing immediate rash, facial/tongue/throat swelling, SOB or lightheadedness with hypotension: Yes Has patient had a PCN reaction causing severe rash involving mucus membranes or skin necrosis: Yes Has patient had a PCN reaction that required hospitalization: No Has patient had a PCN reaction occurring within the last 10 years: Yes If all of the above answers are "NO", then may proceed with Cephalosporin use.   Diona Fanti [Aspirin] Hives  . Levaquin [Levofloxacin In D5w] Hives    Current Medications: Current Outpatient Prescriptions  Medication Sig Dispense Refill  . albuterol (PROVENTIL) (2.5 MG/3ML) 0.083% nebulizer solution Take 2.5 mg by nebulization every 6 (six) hours as needed for wheezing or  shortness of breath.    . Albuterol Sulfate (PROAIR RESPICLICK) 161 (90 BASE) MCG/ACT AEPB Inhale 2 puffs into the lungs every 6 (six) hours as needed. 1 each 11  . fluticasone (FLONASE) 50 MCG/ACT nasal spray Place 1 spray into both nostrils daily as needed for congestion.    . Fluticasone-Salmeterol (ADVAIR) 250-50 MCG/DOSE AEPB Inhale 1 puff into the lungs 2 (two) times daily.    Marland Kitchen ibuprofen (ADVIL,MOTRIN) 200 MG tablet Take 400 mg by mouth as needed for headache or moderate pain.     Marland Kitchen ibuprofen (ADVIL,MOTRIN) 600 MG tablet Take 1 tablet (600 mg total) by mouth every 6 (six) hours. 45 tablet 1  . levothyroxine (SYNTHROID, LEVOTHROID) 75 MCG tablet Take 75 mcg by mouth daily before breakfast.    . loratadine (CLARITIN) 10 MG tablet Take 10 mg by mouth daily.    Marland Kitchen losartan-hydrochlorothiazide (HYZAAR) 50-12.5 MG per tablet Take 1 tablet by mouth daily.    Marland Kitchen omeprazole (PRILOSEC OTC) 20 MG tablet Take 20 mg by mouth daily.     Marland Kitchen oxyCODONE (ROXICODONE) 5 MG immediate release tablet Take 1 tablet (5 mg total) by mouth every 4 (four) hours as needed. 21 tablet 0  .  Probiotic CAPS Take 1 capsule by mouth daily.     No current facility-administered medications for this visit.     Review of Systems General: negative for, fevers, chills, fatigue, changes in sleep, changes in weight or appetite Skin: negative for changes in color, texture, moles or lesions Eyes: negative for, changes in vision, pain, diplopia HEENT: negative for, change in hearing, pain, discharge, tinnitus, vertigo, voice changes, sore throat, neck masses Breasts: negative for breast lumps Pulmonary: negative for, dyspnea, orthopnea, productive cough Cardiac: negative for, palpitations, syncope, pain, discomfort, pressure Gastrointestinal: negative for, dysphagia, nausea, vomiting, jaundice, pain, constipation, diarrhea, hematemesis, hematochezia Genitourinary/Sexual: negative for, dysuria, discharge, hesitancy, nocturia,  retention, stones, infections, STD's, incontinence Ob/Gyn: negative for, irregular bleeding, pain Musculoskeletal: negative for, pain, stiffness, swelling, range of motion limitation Hematology: negative for, easy bruising, bleeding Neurologic/Psych: negative for, headaches, seizures, paralysis, weakness, tremor, change in gait, change in sensation, mood swings, depression, anxiety, change in memory  Objective:  Physical Examination:  BP (!) 153/78   Pulse 82   Temp (!) 97.1 F (36.2 C) (Tympanic)   Resp 18   Ht 5' (1.524 m)   Wt 156 lb 6.4 oz (70.9 kg)   BMI 30.54 kg/m    ECOG Performance Status: 0 - Asymptomatic  General appearance: alert, cooperative and appears stated age HEENT:extra ocular movement intact, neck supple with midline trachea and thyroid without masses Lymph node survey: non-palpable, axillary, inguinal, supraclavicular Cardiovascular: regular rate and rhythm, no murmurs or gallops Respiratory: normal air entry, lungs clear to auscultation and no rales, rhonchi or wheezing Breast exam: not examined. Abdomen: no hernias and well healed incisions Back: inspection of back is normal Extremities: extremities normal, atraumatic, no cyanosis or edema Skin exam - normal coloration and turgor, no rashes, no suspicious skin lesions noted. Neurological exam reveals alert, oriented, normal speech, no focal findings or movement disorder noted.  Pelvic: exam chaperoned by nurse;  Vulva: normal appearing vulva with no masses, tenderness or lesions; Vagina: normal vagina; Adnexa: normal     Assessment:  Anita Kent is a 76 y.o. 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  She has deep almost full thickness myometrial invasion and fallopian tube metastasis with spillage of cancer from uterine perforation. 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  Plan:   Problem List  Items Addressed This Visit      Other   Endometrial cancer, FIGO stage IIIA ([place])     We discussed options for management including external pelvic radiation in view of deep invasion, tubal metastasis, lack of lymph node assessment and uterine perforation.  Will get CT chest/Abd/Pelvis first to assess for any residual disease.  Would also consider hormonal therapy for ER/PR positive grade 1 cancer after radiation if CT negative.  If CT shows other mets we may want to treat her with chemotherapy, but otherwise would reserve this for recurrence.  MMR IHC intact so no indication for immunotherapy or Lynch syndrome work up.  The patient's diagnosis, an outline of the further diagnostic and laboratory studies which will be required, the recommendation, and alternatives were discussed.  All questions were answered to the patient's satisfaction.  A total of 60 minutes were spent with the patient/family today; 50% was spent in education, counseling and coordination of care for endometrial cancer.    Mellody Drown, MD  CC:  Marinda Elk, MD [facility]-  [place], [place] [address] [phone]

## 2016-09-21 ENCOUNTER — Other Ambulatory Visit: Payer: Self-pay

## 2016-09-24 ENCOUNTER — Ambulatory Visit
Admission: RE | Admit: 2016-09-24 | Discharge: 2016-09-24 | Disposition: A | Discharge status: Home / Self Care | Payer: Medicare Other | Source: Ambulatory Visit | Attending: Obstetrics and Gynecology | Admitting: Obstetrics and Gynecology

## 2016-09-24 DIAGNOSIS — I2510 Atherosclerotic heart disease of native coronary artery without angina pectoris: Secondary | ICD-10-CM | POA: Diagnosis not present

## 2016-09-24 DIAGNOSIS — Z90722 Acquired absence of ovaries, bilateral: Secondary | ICD-10-CM | POA: Insufficient documentation

## 2016-09-24 DIAGNOSIS — K449 Diaphragmatic hernia without obstruction or gangrene: Secondary | ICD-10-CM | POA: Insufficient documentation

## 2016-09-24 DIAGNOSIS — I700 Atherosclerosis of aorta: Secondary | ICD-10-CM | POA: Insufficient documentation

## 2016-09-24 DIAGNOSIS — C541 Malignant neoplasm of endometrium: Secondary | ICD-10-CM | POA: Insufficient documentation

## 2016-09-24 DIAGNOSIS — Z90710 Acquired absence of both cervix and uterus: Secondary | ICD-10-CM | POA: Diagnosis not present

## 2016-09-24 MED ORDER — IOPAMIDOL (ISOVUE-300) INJECTION 61%
75.0000 mL | Freq: Once | INTRAVENOUS | Status: AC | PRN
  Administered 2016-09-24: 75 mL via INTRAVENOUS

## 2016-09-24 NOTE — Progress Notes (Signed)
  Oncology Nurse Navigator Documentation CT CAP sent to Dr. Fransisca Connors for review. Navigator Location: [facility] Onc (09/24/16 1400)   )Navigator Encounter Type: Letter/Fax/Email;Diagnostic Results (09/24/16 1400)                                                    Time Spent with Patient: 15 (09/24/16 1400)

## 2016-09-25 ENCOUNTER — Telehealth: Payer: Self-pay

## 2016-09-25 DIAGNOSIS — C541 Malignant neoplasm of endometrium: Secondary | ICD-10-CM

## 2016-09-25 NOTE — Telephone Encounter (Signed)
Oncology Nurse Navigator Documentation Anita Kent notified of Ct results after they were reviewed by Dr. Fransisca Connors. She would like to pursue the PET if insurance will approve. Orders placed for PET and scheduling notified.  CT Abdomen Pelvis W Contrast (Accession [id number]) (Order [id number])  Imaging  Date: 09/24/2016 Department: [facility] Released By: Loreli Dollar Authorizing: Mellody Drown, MD  Exam Information  Status Exam Begun   Exam Ended    Final [[id number]] 09/24/2016  8:02 AM 09/24/2016  8:30 AM  PACS Images  Show images for CT Abdomen Pelvis W Contrast  Study Result  CLINICAL DATA:  New diagnosis of endometrial carcinoma.   EXAM: CT CHEST, ABDOMEN, AND PELVIS WITH CONTRAST   TECHNIQUE: Multidetector CT imaging of the chest, abdomen and pelvis was performed following the standard protocol during bolus administration of intravenous contrast.   CONTRAST:  3mL ISOVUE-300 IOPAMIDOL (ISOVUE-300) INJECTION 61%   COMPARISON:  None.   FINDINGS: CT CHEST FINDINGS   Cardiovascular: The heart size appears normal. Aortic atherosclerosis identified. Calcification involving the RCA and LAD coronary artery identified. No pericardial effusion identified.   Mediastinum/Nodes: The trachea appears patent and is midline. Moderate size hiatal hernia identified. No mediastinal or hilar adenopathy identified.   Lungs/Pleura: No pleural effusion. Peripheral wedge-shaped area of subsegmental atelectasis noted within the anterior left upper lobe, image 90 of series 4. Scar versus subsegmental atelectasis noted within the posteromedial right lung base and right middle lobe.   Musculoskeletal: No chest wall mass or suspicious bone lesions identified.   CT ABDOMEN PELVIS FINDINGS   Hepatobiliary: Multiple cysts identified within the liver. Too numerous to count. 1.5 cm, image 67 of series 2. There is focal ectasia of the distal common bile  duct which measures up to 5.4 mm. No pancreatic mass or inflammation.   Pancreas: Unremarkable. No pancreatic ductal dilatation or surrounding inflammatory changes.   Spleen: The adrenal glands appear normal. Normal appearance of both kidneys. Urinary bladder is unremarkable.   Adrenals/Urinary Tract: Moderate size hiatal hernia. The small bowel loops have a normal course and caliber. No obstruction. Unremarkable appearance of the colon.   Stomach/Bowel: Aortic atherosclerosis. No aneurysm. Soft tissue attenuating structure within the left iliac fossa has a short axis of 1.3 cm and suspicious for enlarged lymph node. No additional enlarged pelvic or inguinal lymph nodes.   Vascular/Lymphatic: Aortic atherosclerosis. No aneurysm. No abdominal or pelvic adenopathy.   Reproductive: Previous hysterectomy and bilateral salpingo oophorectomy. No adnexal mass.   Other: There is no ascites or focal fluid collections within the abdomen or pelvis.   Musculoskeletal: Degenerative disc disease noted within the lumbar spine.   IMPRESSION: 1. Status post hysterectomy and bilateral salpingo oophorectomy. 2. Within the left iliac fossa there is a soft tissue attenuating structure with a short axis of 1.3 cm and is suspicious for adenopathy. Suggest further evaluation with PET-CT. 3. No evidence for distant metastatic disease. 4. Hiatal hernia 5. Aortic Atherosclerosis (ICD10-I70.0). Multi vessel coronary artery calcifications.     Electronically Signed   By: Kerby Moors M.D.   On: 09/24/2016 10:24         Navigator Location: [facility] On[facility] (09/25/16 1000)   )Navigator Encounter Type: Telephone;Diagnostic Results (09/25/16 1000)  Time Spent with Patient: 15 (09/25/16 1000)

## 2016-09-29 ENCOUNTER — Encounter
Admission: RE | Admit: 2016-09-29 | Discharge: 2016-09-29 | Disposition: A | Discharge status: Home / Self Care | Payer: Medicare Other | Source: Ambulatory Visit | Attending: Obstetrics and Gynecology | Admitting: Obstetrics and Gynecology

## 2016-09-29 DIAGNOSIS — C541 Malignant neoplasm of endometrium: Secondary | ICD-10-CM | POA: Diagnosis not present

## 2016-09-29 LAB — GLUCOSE, CAPILLARY: Glucose-Capillary: 100 mg/dL — ABNORMAL HIGH (ref 65–99)

## 2016-09-29 MED ORDER — FLUDEOXYGLUCOSE F - 18 (FDG) INJECTION
13.1390 | Freq: Once | INTRAVENOUS | Status: AC | PRN
  Administered 2016-09-29: 13.139 via INTRAVENOUS

## 2016-10-01 ENCOUNTER — Encounter: Payer: Self-pay | Admitting: Radiation Oncology

## 2016-10-01 ENCOUNTER — Ambulatory Visit
Admission: RE | Admit: 2016-10-01 | Discharge: 2016-10-01 | Disposition: A | Discharge status: Home / Self Care | Payer: Medicare Other | Source: Ambulatory Visit | Attending: Radiation Oncology | Admitting: Radiation Oncology

## 2016-10-01 ENCOUNTER — Other Ambulatory Visit: Payer: Self-pay | Admitting: *Deleted

## 2016-10-01 VITALS — BP 138/71 | HR 83 | Temp 96.2°F | Resp 20 | Wt 155.2 lb

## 2016-10-01 DIAGNOSIS — E039 Hypothyroidism, unspecified: Secondary | ICD-10-CM | POA: Insufficient documentation

## 2016-10-01 DIAGNOSIS — J45909 Unspecified asthma, uncomplicated: Secondary | ICD-10-CM | POA: Insufficient documentation

## 2016-10-01 DIAGNOSIS — J449 Chronic obstructive pulmonary disease, unspecified: Secondary | ICD-10-CM | POA: Diagnosis not present

## 2016-10-01 DIAGNOSIS — E119 Type 2 diabetes mellitus without complications: Secondary | ICD-10-CM | POA: Diagnosis not present

## 2016-10-01 DIAGNOSIS — I10 Essential (primary) hypertension: Secondary | ICD-10-CM | POA: Diagnosis not present

## 2016-10-01 DIAGNOSIS — C541 Malignant neoplasm of endometrium: Secondary | ICD-10-CM

## 2016-10-01 DIAGNOSIS — R05 Cough: Secondary | ICD-10-CM | POA: Insufficient documentation

## 2016-10-01 DIAGNOSIS — Z170 Estrogen receptor positive status [ER+]: Secondary | ICD-10-CM | POA: Diagnosis not present

## 2016-10-01 DIAGNOSIS — Z79899 Other long term (current) drug therapy: Secondary | ICD-10-CM | POA: Diagnosis not present

## 2016-10-01 DIAGNOSIS — E785 Hyperlipidemia, unspecified: Secondary | ICD-10-CM | POA: Diagnosis not present

## 2016-10-01 DIAGNOSIS — Z90722 Acquired absence of ovaries, bilateral: Secondary | ICD-10-CM | POA: Diagnosis not present

## 2016-10-01 DIAGNOSIS — Z90710 Acquired absence of both cervix and uterus: Secondary | ICD-10-CM | POA: Insufficient documentation

## 2016-10-01 DIAGNOSIS — Z510 Encounter for antineoplastic radiation therapy: Secondary | ICD-10-CM | POA: Insufficient documentation

## 2016-10-01 MED ORDER — PREDNISONE 10 MG PO TABS: 10.0000 mg | ORAL_TABLET | Freq: Every day | ORAL | 0 refills | Status: DC

## 2016-10-01 NOTE — Consult Note (Signed)
NEW PATIENT EVALUATION  Name: Anita Kent  MRN: [id number]  Date:   10/01/2016     DOB: 1940-08-06   This 76 y.o. 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  REFERRING PHYSICIAN: Marinda Elk, MD  CHIEF COMPLAINT:  Chief Complaint  Patient presents with  . Cancer    Pt is here for initial consult of endometrial cancer.      DIAGNOSIS: The encounter diagnosis was Endometrium cancer ([place]).   PREVIOUS INVESTIGATIONS:  PET scan is reviewed Pathology reports reviewed Clinical notes reviewed  HPI: Patient is a 76 year old female who presented with postmenopausal bleeding in October 2017. She was found to have on examination the endometrial cavity mass. Interestingly at the time of D&C she was found to have a perforated uterus and for that reason they proceeded to T LH/BSO. Pathology showed FIGO grade 1 endometrial carcinoma with deep myometrial invasion 15.5 mm out of a total thickness of 16 mm. No cervical stromal or uterine serosa involvement was identified right fallopian tube was involved. Peritoneal cytology was not submitted no lymphovascular invasion was identified no regional lymph nodes were sampled. This was considered a pathologic T3a FIGO IIIa endometrial carcinoma. She has done well postoperatively. She specifically denies of abdominal pain or discomfort any problems with lower urinary tract symptoms or vaginal discharge or bleeding. She was presented at our at GYN tumor conference and recognition for postoperative radiation therapy to her whole pelvis was made. Patient did have a postop PET CT scan showing no evidence of disease.  PLANNED TREATMENT REGIMEN: Whole pelvic radiation plus vaginal apex brachytherapy boost  PAST MEDICAL HISTORY:  has a past medical history of  Asthma; COPD (chronic obstructive pulmonary disease) ([place]); Cough variant asthma; Diabetes ([place]); Dyslipidemia; endometrial cancer (08/2016); HTN (hypertension); Hypertension; Hypothyroidism; and Thyroid disease.    PAST SURGICAL HISTORY:  Past Surgical History:  Procedure Laterality Date  . ABDOMINAL HYSTERECTOMY    . CATARACT EXTRACTION, BILATERAL    . CYSTOSCOPY N/A 09/04/2016   Procedure: CYSTOSCOPY;  Surgeon: Ward, Honor Loh, MD;  Location: [facility];  Service: Gynecology;  Laterality: N/A;  . DILATATION & CURETTAGE/HYSTEROSCOPY WITH MYOSURE N/A 09/04/2016   Procedure: DILATATION & CURETTAGE/HYSTEROSCOPY WITH MYOSURE;  Surgeon: Ward, Honor Loh, MD;  Location: [facility];  Service: Gynecology;  Laterality: N/A;  . GANGLION CYST EXCISION Left    hand  . LAPAROSCOPIC BILATERAL SALPINGO OOPHERECTOMY Bilateral 09/04/2016   Procedure: LAPAROSCOPIC BILATERAL SALPINGO OOPHORECTOMY;  Surgeon: Ward, Honor Loh, MD;  Location: [facility];  Service: Gynecology;  Laterality: Bilateral;  . LAPAROSCOPIC HYSTERECTOMY  09/04/2016   Procedure: HYSTERECTOMY TOTAL LAPAROSCOPIC;  Surgeon: Ward, Honor Loh, MD;  Location: [facility];  Service: Gynecology;;  . primary open reduction procedure Right 11/09/2013    right ankle  . TONSILLECTOMY    . TUBAL LIGATION      FAMILY HISTORY: family history includes Myasthenia gravis in her daughter.  SOCIAL HISTORY:  reports that she has never smoked. She has never used smokeless tobacco. She reports that she does not drink alcohol or use drugs.  ALLERGIES: Oxycodone; Sulfa antibiotics; Amoxicillin; Asa [aspirin]; and Levaquin [levofloxacin in d5w]  MEDICATIONS:  Current Outpatient Prescriptions  Medication Sig Dispense Refill  . albuterol (PROVENTIL) (2.5 MG/3ML) 0.083% nebulizer solution Take 2.5 mg by nebulization every 6 (six) hours as needed for wheezing or  shortness of breath.    . Albuterol Sulfate (PROAIR RESPICLICK) 854 (90 BASE) MCG/ACT AEPB Inhale 2 puffs into the  lungs every 6 (six) hours as needed. 1 each 11  . fluticasone (FLONASE) 50 MCG/ACT nasal spray Place 1 spray into both nostrils daily as needed for congestion.    . Fluticasone-Salmeterol (ADVAIR) 250-50 MCG/DOSE AEPB Inhale 1 puff into the lungs 2 (two) times daily.    Marland Kitchen ibuprofen (ADVIL,MOTRIN) 200 MG tablet Take 400 mg by mouth as needed for headache or moderate pain.     Marland Kitchen levothyroxine (SYNTHROID, LEVOTHROID) 75 MCG tablet Take 75 mcg by mouth daily before breakfast.    . loratadine (CLARITIN) 10 MG tablet Take 10 mg by mouth daily.    Marland Kitchen losartan-hydrochlorothiazide (HYZAAR) 50-12.5 MG per tablet Take 1 tablet by mouth daily.    Marland Kitchen omeprazole (PRILOSEC OTC) 20 MG tablet Take 20 mg by mouth daily.     . Probiotic CAPS Take 1 capsule by mouth daily.    . predniSONE (DELTASONE) 10 MG tablet Take 1 tablet (10 mg total) by mouth daily with breakfast. 12 day taper (60 mg x2 days, 50 mg x2 days, ...decrease by 10 mg every 2 days) 42 tablet 0   No current facility-administered medications for this encounter.     ECOG PERFORMANCE STATUS:  0 - Asymptomatic  REVIEW OF SYSTEMS:  Patient denies any weight loss, fatigue, weakness, fever, chills or night sweats. Patient denies any loss of vision, blurred vision. Patient denies any ringing  of the ears or hearing loss. No irregular heartbeat. Patient denies heart murmur or history of fainting. Patient denies any chest pain or pain radiating to her upper extremities. Patient denies any shortness of breath, difficulty breathing at night, cough or hemoptysis. Patient denies any swelling in the lower legs. Patient denies any nausea vomiting, vomiting of blood, or coffee ground material in the vomitus. Patient denies any stomach pain. Patient states has had normal bowel movements no significant constipation or diarrhea. Patient denies any dysuria, hematuria or significant nocturia. Patient denies any problems walking, swelling in the joints or loss of balance.  Patient denies any skin changes, loss of hair or loss of weight. Patient denies any excessive worrying or anxiety or significant depression. Patient denies any problems with insomnia. Patient denies excessive thirst, polyuria, polydipsia. Patient denies any swollen glands, patient denies easy bruising or easy bleeding. Patient denies any recent infections, allergies or URI. Patient "s visual fields have not changed significantly in recent time.    PHYSICAL EXAM: BP 138/71   Pulse 83   Temp (!) 96.2 F (35.7 C)   Resp 20   Wt 155 lb 3.3 oz (70.4 kg)   BMI 30.31 kg/m  On speculum examination vaginal apex is well-healed. No evidence of vaginal mucosal lesions are noted. Bimanual examination shows no evidence of parametrial mass or nodularity rectal exam is unremarkable. Well-developed well-nourished patient in NAD. HEENT reveals PERLA, EOMI, discs not visualized.  Oral cavity is clear. No oral mucosal lesions are identified. Neck is clear without evidence of cervical or supraclavicular adenopathy. Lungs are clear to A&P. Cardiac examination is essentially unremarkable with regular rate and rhythm without murmur rub or thrill. Abdomen is benign with no organomegaly or masses noted. Motor sensory and DTR levels are equal and symmetric in the upper and lower extremities. Cranial nerves II through XII are grossly intact. Proprioception is intact. No peripheral adenop stageathy or edema is identified. No motor or sensory levels are noted.  Crude visual fields are within normal range.  LABORATORY DATA: Pathology reports reviewed    RADIOLOGY RESULTS: PET CT reviewed showing no evidence of distant disease or residual disease with her pelvis or pelvic involvement of lymph nodes.   IMPRESSION: FIGO stage IIIa grade 1 endometrial carcinoma status post T LH/BSO with fallopian tube involvement and deep myometrial invasion in 76 year old female  PLAN: Based on the patient'sRisk data and based on thePORTEC  benefits of radiation therapy 5 your local regional recurrence is improved with external beam as well as vaginal vault brachytherapy. I would propose treating her whole pelvis to 4500 cGy boosting her vaginal vault 1200 cGy with 3 vaginal brachytherapy treatments. Risks and benefits of treatment including creased chance of diarrhea, increased lower urinary tract symptoms fatigue skin reaction alteration of blood counts all were discussed in detail. Also discussed possibility of vaginal stenosis from brachytherapy. I personally set up and ordered CT simulation for early next week. Patient also may benefit for from antiestrogen therapy based on the ER/PR positivity of her disease and will send patient to medical oncology for evaluation. Patient and daughter both seem to compress my treatment plan well.  I would like to take this opportunity to thank you for allowing me to participate in the care of your patient.Armstead Peaks., MD

## 2016-10-06 ENCOUNTER — Ambulatory Visit
Admission: RE | Admit: 2016-10-06 | Discharge: 2016-10-06 | Disposition: A | Discharge status: Home / Self Care | Payer: Medicare Other | Source: Ambulatory Visit | Attending: Radiation Oncology | Admitting: Radiation Oncology

## 2016-10-06 DIAGNOSIS — J449 Chronic obstructive pulmonary disease, unspecified: Secondary | ICD-10-CM | POA: Diagnosis not present

## 2016-10-06 DIAGNOSIS — C541 Malignant neoplasm of endometrium: Secondary | ICD-10-CM | POA: Diagnosis not present

## 2016-10-06 DIAGNOSIS — R05 Cough: Secondary | ICD-10-CM | POA: Diagnosis not present

## 2016-10-06 DIAGNOSIS — J45909 Unspecified asthma, uncomplicated: Secondary | ICD-10-CM | POA: Diagnosis not present

## 2016-10-06 DIAGNOSIS — Z170 Estrogen receptor positive status [ER+]: Secondary | ICD-10-CM | POA: Diagnosis not present

## 2016-10-06 DIAGNOSIS — Z510 Encounter for antineoplastic radiation therapy: Secondary | ICD-10-CM | POA: Diagnosis not present

## 2016-10-07 ENCOUNTER — Other Ambulatory Visit: Payer: Self-pay | Admitting: *Deleted

## 2016-10-07 DIAGNOSIS — C541 Malignant neoplasm of endometrium: Secondary | ICD-10-CM

## 2016-10-12 DIAGNOSIS — J45909 Unspecified asthma, uncomplicated: Secondary | ICD-10-CM | POA: Diagnosis not present

## 2016-10-12 DIAGNOSIS — R05 Cough: Secondary | ICD-10-CM | POA: Diagnosis not present

## 2016-10-12 DIAGNOSIS — Z510 Encounter for antineoplastic radiation therapy: Secondary | ICD-10-CM | POA: Diagnosis not present

## 2016-10-12 DIAGNOSIS — J449 Chronic obstructive pulmonary disease, unspecified: Secondary | ICD-10-CM | POA: Diagnosis not present

## 2016-10-12 DIAGNOSIS — C541 Malignant neoplasm of endometrium: Secondary | ICD-10-CM | POA: Diagnosis not present

## 2016-10-12 DIAGNOSIS — Z170 Estrogen receptor positive status [ER+]: Secondary | ICD-10-CM | POA: Diagnosis not present

## 2016-10-13 ENCOUNTER — Ambulatory Visit
Admission: RE | Admit: 2016-10-13 | Discharge: 2016-10-13 | Disposition: A | Discharge status: Home / Self Care | Payer: Medicare Other | Source: Ambulatory Visit | Attending: Radiation Oncology | Admitting: Radiation Oncology

## 2016-10-13 DIAGNOSIS — R05 Cough: Secondary | ICD-10-CM | POA: Diagnosis not present

## 2016-10-13 DIAGNOSIS — Z510 Encounter for antineoplastic radiation therapy: Secondary | ICD-10-CM | POA: Diagnosis not present

## 2016-10-13 DIAGNOSIS — J45909 Unspecified asthma, uncomplicated: Secondary | ICD-10-CM | POA: Diagnosis not present

## 2016-10-13 DIAGNOSIS — Z170 Estrogen receptor positive status [ER+]: Secondary | ICD-10-CM | POA: Diagnosis not present

## 2016-10-13 DIAGNOSIS — C541 Malignant neoplasm of endometrium: Secondary | ICD-10-CM | POA: Diagnosis not present

## 2016-10-13 DIAGNOSIS — J449 Chronic obstructive pulmonary disease, unspecified: Secondary | ICD-10-CM | POA: Diagnosis not present

## 2016-10-14 ENCOUNTER — Ambulatory Visit
Admission: RE | Admit: 2016-10-14 | Discharge: 2016-10-14 | Disposition: A | Discharge status: Home / Self Care | Payer: Medicare Other | Source: Ambulatory Visit | Attending: Radiation Oncology | Admitting: Radiation Oncology

## 2016-10-14 DIAGNOSIS — J45909 Unspecified asthma, uncomplicated: Secondary | ICD-10-CM | POA: Diagnosis not present

## 2016-10-14 DIAGNOSIS — Z170 Estrogen receptor positive status [ER+]: Secondary | ICD-10-CM | POA: Diagnosis not present

## 2016-10-14 DIAGNOSIS — Z510 Encounter for antineoplastic radiation therapy: Secondary | ICD-10-CM | POA: Diagnosis not present

## 2016-10-14 DIAGNOSIS — J449 Chronic obstructive pulmonary disease, unspecified: Secondary | ICD-10-CM | POA: Diagnosis not present

## 2016-10-14 DIAGNOSIS — R05 Cough: Secondary | ICD-10-CM | POA: Diagnosis not present

## 2016-10-14 DIAGNOSIS — C541 Malignant neoplasm of endometrium: Secondary | ICD-10-CM | POA: Diagnosis not present

## 2016-10-15 ENCOUNTER — Ambulatory Visit
Admission: RE | Admit: 2016-10-15 | Discharge: 2016-10-15 | Disposition: A | Discharge status: Home / Self Care | Payer: Medicare Other | Source: Ambulatory Visit | Attending: Radiation Oncology | Admitting: Radiation Oncology

## 2016-10-15 ENCOUNTER — Inpatient Hospital Stay: Payer: Medicare Other | Attending: Radiation Oncology

## 2016-10-15 DIAGNOSIS — R05 Cough: Secondary | ICD-10-CM | POA: Diagnosis not present

## 2016-10-15 DIAGNOSIS — J45909 Unspecified asthma, uncomplicated: Secondary | ICD-10-CM | POA: Diagnosis not present

## 2016-10-15 DIAGNOSIS — C541 Malignant neoplasm of endometrium: Secondary | ICD-10-CM | POA: Diagnosis not present

## 2016-10-15 DIAGNOSIS — J449 Chronic obstructive pulmonary disease, unspecified: Secondary | ICD-10-CM | POA: Diagnosis not present

## 2016-10-15 DIAGNOSIS — Z170 Estrogen receptor positive status [ER+]: Secondary | ICD-10-CM | POA: Diagnosis not present

## 2016-10-15 DIAGNOSIS — Z510 Encounter for antineoplastic radiation therapy: Secondary | ICD-10-CM | POA: Diagnosis not present

## 2016-10-16 ENCOUNTER — Ambulatory Visit
Admission: RE | Admit: 2016-10-16 | Discharge: 2016-10-16 | Disposition: A | Discharge status: Home / Self Care | Payer: Medicare Other | Source: Ambulatory Visit | Attending: Radiation Oncology | Admitting: Radiation Oncology

## 2016-10-16 DIAGNOSIS — Z510 Encounter for antineoplastic radiation therapy: Secondary | ICD-10-CM | POA: Diagnosis not present

## 2016-10-16 DIAGNOSIS — Z170 Estrogen receptor positive status [ER+]: Secondary | ICD-10-CM | POA: Diagnosis not present

## 2016-10-16 DIAGNOSIS — J45909 Unspecified asthma, uncomplicated: Secondary | ICD-10-CM | POA: Diagnosis not present

## 2016-10-16 DIAGNOSIS — C541 Malignant neoplasm of endometrium: Secondary | ICD-10-CM | POA: Diagnosis not present

## 2016-10-16 DIAGNOSIS — J449 Chronic obstructive pulmonary disease, unspecified: Secondary | ICD-10-CM | POA: Diagnosis not present

## 2016-10-16 DIAGNOSIS — R05 Cough: Secondary | ICD-10-CM | POA: Diagnosis not present

## 2016-10-20 ENCOUNTER — Ambulatory Visit
Admission: RE | Admit: 2016-10-20 | Discharge: 2016-10-20 | Disposition: A | Discharge status: Home / Self Care | Payer: Medicare Other | Source: Ambulatory Visit | Attending: Radiation Oncology | Admitting: Radiation Oncology

## 2016-10-20 DIAGNOSIS — Z170 Estrogen receptor positive status [ER+]: Secondary | ICD-10-CM | POA: Diagnosis not present

## 2016-10-20 DIAGNOSIS — C541 Malignant neoplasm of endometrium: Secondary | ICD-10-CM | POA: Diagnosis not present

## 2016-10-20 DIAGNOSIS — J45909 Unspecified asthma, uncomplicated: Secondary | ICD-10-CM | POA: Diagnosis not present

## 2016-10-20 DIAGNOSIS — J449 Chronic obstructive pulmonary disease, unspecified: Secondary | ICD-10-CM | POA: Diagnosis not present

## 2016-10-20 DIAGNOSIS — Z510 Encounter for antineoplastic radiation therapy: Secondary | ICD-10-CM | POA: Diagnosis not present

## 2016-10-20 DIAGNOSIS — R05 Cough: Secondary | ICD-10-CM | POA: Diagnosis not present

## 2016-10-21 ENCOUNTER — Ambulatory Visit
Admission: RE | Admit: 2016-10-21 | Discharge: 2016-10-21 | Disposition: A | Discharge status: Home / Self Care | Payer: Medicare Other | Source: Ambulatory Visit | Attending: Radiation Oncology | Admitting: Radiation Oncology

## 2016-10-21 DIAGNOSIS — J449 Chronic obstructive pulmonary disease, unspecified: Secondary | ICD-10-CM | POA: Diagnosis not present

## 2016-10-21 DIAGNOSIS — J45909 Unspecified asthma, uncomplicated: Secondary | ICD-10-CM | POA: Diagnosis not present

## 2016-10-21 DIAGNOSIS — R05 Cough: Secondary | ICD-10-CM | POA: Diagnosis not present

## 2016-10-21 DIAGNOSIS — Z510 Encounter for antineoplastic radiation therapy: Secondary | ICD-10-CM | POA: Diagnosis not present

## 2016-10-21 DIAGNOSIS — Z170 Estrogen receptor positive status [ER+]: Secondary | ICD-10-CM | POA: Diagnosis not present

## 2016-10-21 DIAGNOSIS — C541 Malignant neoplasm of endometrium: Secondary | ICD-10-CM | POA: Diagnosis not present

## 2016-10-22 ENCOUNTER — Ambulatory Visit
Admission: RE | Admit: 2016-10-22 | Discharge: 2016-10-22 | Disposition: A | Discharge status: Home / Self Care | Payer: Medicare Other | Source: Ambulatory Visit | Attending: Radiation Oncology | Admitting: Radiation Oncology

## 2016-10-22 ENCOUNTER — Inpatient Hospital Stay: Payer: Medicare Other | Attending: Radiation Oncology

## 2016-10-22 DIAGNOSIS — J449 Chronic obstructive pulmonary disease, unspecified: Secondary | ICD-10-CM | POA: Diagnosis not present

## 2016-10-22 DIAGNOSIS — C541 Malignant neoplasm of endometrium: Secondary | ICD-10-CM | POA: Diagnosis not present

## 2016-10-22 DIAGNOSIS — Z510 Encounter for antineoplastic radiation therapy: Secondary | ICD-10-CM | POA: Diagnosis not present

## 2016-10-22 DIAGNOSIS — J45909 Unspecified asthma, uncomplicated: Secondary | ICD-10-CM | POA: Diagnosis not present

## 2016-10-22 DIAGNOSIS — R05 Cough: Secondary | ICD-10-CM | POA: Diagnosis not present

## 2016-10-22 DIAGNOSIS — Z170 Estrogen receptor positive status [ER+]: Secondary | ICD-10-CM | POA: Diagnosis not present

## 2016-10-22 LAB — CBC
HCT: 31.7 % — ABNORMAL LOW (ref 35.0–47.0)
Hemoglobin: 10.7 g/dL — ABNORMAL LOW (ref 12.0–16.0)
MCH: 28.7 pg (ref 26.0–34.0)
MCHC: 33.7 g/dL (ref 32.0–36.0)
MCV: 85.1 fL (ref 80.0–100.0)
Platelets: 281 10*3/uL (ref 150–440)
RBC: 3.72 MIL/uL — ABNORMAL LOW (ref 3.80–5.20)
RDW: 16.3 % — ABNORMAL HIGH (ref 11.5–14.5)
WBC: 4.9 10*3/uL (ref 3.6–11.0)

## 2016-10-23 ENCOUNTER — Ambulatory Visit
Admission: RE | Admit: 2016-10-23 | Discharge: 2016-10-23 | Disposition: A | Discharge status: Home / Self Care | Payer: Medicare Other | Source: Ambulatory Visit | Attending: Radiation Oncology | Admitting: Radiation Oncology

## 2016-10-23 ENCOUNTER — Encounter (INDEPENDENT_AMBULATORY_CARE_PROVIDER_SITE_OTHER): Payer: Self-pay

## 2016-10-23 DIAGNOSIS — Z170 Estrogen receptor positive status [ER+]: Secondary | ICD-10-CM | POA: Diagnosis not present

## 2016-10-23 DIAGNOSIS — J449 Chronic obstructive pulmonary disease, unspecified: Secondary | ICD-10-CM | POA: Diagnosis not present

## 2016-10-23 DIAGNOSIS — J45909 Unspecified asthma, uncomplicated: Secondary | ICD-10-CM | POA: Diagnosis not present

## 2016-10-23 DIAGNOSIS — C541 Malignant neoplasm of endometrium: Secondary | ICD-10-CM | POA: Diagnosis not present

## 2016-10-23 DIAGNOSIS — Z510 Encounter for antineoplastic radiation therapy: Secondary | ICD-10-CM | POA: Diagnosis not present

## 2016-10-23 DIAGNOSIS — R05 Cough: Secondary | ICD-10-CM | POA: Diagnosis not present

## 2016-10-26 ENCOUNTER — Ambulatory Visit
Admission: RE | Admit: 2016-10-26 | Discharge: 2016-10-26 | Disposition: A | Discharge status: Home / Self Care | Payer: Medicare Other | Source: Ambulatory Visit | Attending: Radiation Oncology | Admitting: Radiation Oncology

## 2016-10-26 DIAGNOSIS — R05 Cough: Secondary | ICD-10-CM | POA: Diagnosis not present

## 2016-10-26 DIAGNOSIS — Z170 Estrogen receptor positive status [ER+]: Secondary | ICD-10-CM | POA: Diagnosis not present

## 2016-10-26 DIAGNOSIS — Z510 Encounter for antineoplastic radiation therapy: Secondary | ICD-10-CM | POA: Diagnosis not present

## 2016-10-26 DIAGNOSIS — J45909 Unspecified asthma, uncomplicated: Secondary | ICD-10-CM | POA: Diagnosis not present

## 2016-10-26 DIAGNOSIS — C541 Malignant neoplasm of endometrium: Secondary | ICD-10-CM | POA: Diagnosis not present

## 2016-10-26 DIAGNOSIS — J449 Chronic obstructive pulmonary disease, unspecified: Secondary | ICD-10-CM | POA: Diagnosis not present

## 2016-10-27 ENCOUNTER — Ambulatory Visit
Admission: RE | Admit: 2016-10-27 | Discharge: 2016-10-27 | Disposition: A | Discharge status: Home / Self Care | Payer: Medicare Other | Source: Ambulatory Visit | Attending: Radiation Oncology | Admitting: Radiation Oncology

## 2016-10-27 DIAGNOSIS — J449 Chronic obstructive pulmonary disease, unspecified: Secondary | ICD-10-CM | POA: Diagnosis not present

## 2016-10-27 DIAGNOSIS — R05 Cough: Secondary | ICD-10-CM | POA: Diagnosis not present

## 2016-10-27 DIAGNOSIS — J45909 Unspecified asthma, uncomplicated: Secondary | ICD-10-CM | POA: Diagnosis not present

## 2016-10-27 DIAGNOSIS — Z170 Estrogen receptor positive status [ER+]: Secondary | ICD-10-CM | POA: Diagnosis not present

## 2016-10-27 DIAGNOSIS — Z510 Encounter for antineoplastic radiation therapy: Secondary | ICD-10-CM | POA: Diagnosis not present

## 2016-10-27 DIAGNOSIS — C541 Malignant neoplasm of endometrium: Secondary | ICD-10-CM | POA: Diagnosis not present

## 2016-10-28 ENCOUNTER — Inpatient Hospital Stay: Payer: Medicare Other

## 2016-10-28 ENCOUNTER — Ambulatory Visit
Admission: RE | Admit: 2016-10-28 | Discharge: 2016-10-28 | Disposition: A | Discharge status: Home / Self Care | Payer: Medicare Other | Source: Ambulatory Visit | Attending: Radiation Oncology | Admitting: Radiation Oncology

## 2016-10-28 DIAGNOSIS — C541 Malignant neoplasm of endometrium: Secondary | ICD-10-CM | POA: Diagnosis not present

## 2016-10-28 DIAGNOSIS — Z170 Estrogen receptor positive status [ER+]: Secondary | ICD-10-CM | POA: Diagnosis not present

## 2016-10-28 DIAGNOSIS — R05 Cough: Secondary | ICD-10-CM | POA: Diagnosis not present

## 2016-10-28 DIAGNOSIS — J45909 Unspecified asthma, uncomplicated: Secondary | ICD-10-CM | POA: Diagnosis not present

## 2016-10-28 DIAGNOSIS — Z510 Encounter for antineoplastic radiation therapy: Secondary | ICD-10-CM | POA: Diagnosis not present

## 2016-10-28 DIAGNOSIS — J449 Chronic obstructive pulmonary disease, unspecified: Secondary | ICD-10-CM | POA: Diagnosis not present

## 2016-10-29 ENCOUNTER — Inpatient Hospital Stay: Payer: Medicare Other

## 2016-10-29 ENCOUNTER — Ambulatory Visit
Admission: RE | Admit: 2016-10-29 | Discharge: 2016-10-29 | Disposition: A | Discharge status: Home / Self Care | Payer: Medicare Other | Source: Ambulatory Visit | Attending: Radiation Oncology | Admitting: Radiation Oncology

## 2016-10-29 DIAGNOSIS — J449 Chronic obstructive pulmonary disease, unspecified: Secondary | ICD-10-CM | POA: Diagnosis not present

## 2016-10-29 DIAGNOSIS — C541 Malignant neoplasm of endometrium: Secondary | ICD-10-CM | POA: Diagnosis not present

## 2016-10-29 DIAGNOSIS — Z170 Estrogen receptor positive status [ER+]: Secondary | ICD-10-CM | POA: Diagnosis not present

## 2016-10-29 DIAGNOSIS — R05 Cough: Secondary | ICD-10-CM | POA: Diagnosis not present

## 2016-10-29 DIAGNOSIS — J45909 Unspecified asthma, uncomplicated: Secondary | ICD-10-CM | POA: Diagnosis not present

## 2016-10-29 DIAGNOSIS — Z510 Encounter for antineoplastic radiation therapy: Secondary | ICD-10-CM | POA: Diagnosis not present

## 2016-10-29 LAB — CBC
HCT: 31.6 % — ABNORMAL LOW (ref 35.0–47.0)
Hemoglobin: 10.7 g/dL — ABNORMAL LOW (ref 12.0–16.0)
MCH: 28.6 pg (ref 26.0–34.0)
MCHC: 33.8 g/dL (ref 32.0–36.0)
MCV: 84.8 fL (ref 80.0–100.0)
Platelets: 260 10*3/uL (ref 150–440)
RBC: 3.72 MIL/uL — ABNORMAL LOW (ref 3.80–5.20)
RDW: 16.2 % — ABNORMAL HIGH (ref 11.5–14.5)
WBC: 4.2 10*3/uL (ref 3.6–11.0)

## 2016-10-30 ENCOUNTER — Ambulatory Visit: Payer: Medicare Other

## 2016-11-02 ENCOUNTER — Ambulatory Visit: Admission: RE | Admit: 2016-11-02 | Payer: Medicare Other | Source: Ambulatory Visit

## 2016-11-03 ENCOUNTER — Ambulatory Visit
Admission: RE | Admit: 2016-11-03 | Discharge: 2016-11-03 | Disposition: A | Discharge status: Home / Self Care | Payer: Medicare Other | Source: Ambulatory Visit | Attending: Radiation Oncology | Admitting: Radiation Oncology

## 2016-11-03 DIAGNOSIS — J45909 Unspecified asthma, uncomplicated: Secondary | ICD-10-CM | POA: Diagnosis not present

## 2016-11-03 DIAGNOSIS — Z170 Estrogen receptor positive status [ER+]: Secondary | ICD-10-CM | POA: Diagnosis not present

## 2016-11-03 DIAGNOSIS — R05 Cough: Secondary | ICD-10-CM | POA: Diagnosis not present

## 2016-11-03 DIAGNOSIS — J449 Chronic obstructive pulmonary disease, unspecified: Secondary | ICD-10-CM | POA: Diagnosis not present

## 2016-11-03 DIAGNOSIS — Z510 Encounter for antineoplastic radiation therapy: Secondary | ICD-10-CM | POA: Diagnosis not present

## 2016-11-03 DIAGNOSIS — C541 Malignant neoplasm of endometrium: Secondary | ICD-10-CM | POA: Diagnosis not present

## 2016-11-04 ENCOUNTER — Ambulatory Visit
Admission: RE | Admit: 2016-11-04 | Discharge: 2016-11-04 | Disposition: A | Discharge status: Home / Self Care | Payer: Medicare Other | Source: Ambulatory Visit | Attending: Radiation Oncology | Admitting: Radiation Oncology

## 2016-11-04 DIAGNOSIS — Z170 Estrogen receptor positive status [ER+]: Secondary | ICD-10-CM | POA: Diagnosis not present

## 2016-11-04 DIAGNOSIS — Z510 Encounter for antineoplastic radiation therapy: Secondary | ICD-10-CM | POA: Diagnosis not present

## 2016-11-04 DIAGNOSIS — J449 Chronic obstructive pulmonary disease, unspecified: Secondary | ICD-10-CM | POA: Diagnosis not present

## 2016-11-04 DIAGNOSIS — R05 Cough: Secondary | ICD-10-CM | POA: Diagnosis not present

## 2016-11-04 DIAGNOSIS — J45909 Unspecified asthma, uncomplicated: Secondary | ICD-10-CM | POA: Diagnosis not present

## 2016-11-04 DIAGNOSIS — C541 Malignant neoplasm of endometrium: Secondary | ICD-10-CM | POA: Diagnosis not present

## 2016-11-05 ENCOUNTER — Inpatient Hospital Stay: Payer: Medicare Other

## 2016-11-05 ENCOUNTER — Ambulatory Visit
Admission: RE | Admit: 2016-11-05 | Discharge: 2016-11-05 | Disposition: A | Discharge status: Home / Self Care | Payer: Medicare Other | Source: Ambulatory Visit | Attending: Radiation Oncology | Admitting: Radiation Oncology

## 2016-11-05 DIAGNOSIS — J449 Chronic obstructive pulmonary disease, unspecified: Secondary | ICD-10-CM | POA: Diagnosis not present

## 2016-11-05 DIAGNOSIS — C541 Malignant neoplasm of endometrium: Secondary | ICD-10-CM | POA: Diagnosis not present

## 2016-11-05 DIAGNOSIS — R05 Cough: Secondary | ICD-10-CM | POA: Diagnosis not present

## 2016-11-05 DIAGNOSIS — J45909 Unspecified asthma, uncomplicated: Secondary | ICD-10-CM | POA: Diagnosis not present

## 2016-11-05 DIAGNOSIS — Z170 Estrogen receptor positive status [ER+]: Secondary | ICD-10-CM | POA: Diagnosis not present

## 2016-11-05 DIAGNOSIS — Z510 Encounter for antineoplastic radiation therapy: Secondary | ICD-10-CM | POA: Diagnosis not present

## 2016-11-05 LAB — CBC
HCT: 31.7 % — ABNORMAL LOW (ref 35.0–47.0)
Hemoglobin: 10.7 g/dL — ABNORMAL LOW (ref 12.0–16.0)
MCH: 28.6 pg (ref 26.0–34.0)
MCHC: 33.6 g/dL (ref 32.0–36.0)
MCV: 85.1 fL (ref 80.0–100.0)
Platelets: 282 10*3/uL (ref 150–440)
RBC: 3.73 MIL/uL — ABNORMAL LOW (ref 3.80–5.20)
RDW: 16.4 % — ABNORMAL HIGH (ref 11.5–14.5)
WBC: 4.6 10*3/uL (ref 3.6–11.0)

## 2016-11-06 ENCOUNTER — Ambulatory Visit
Admission: RE | Admit: 2016-11-06 | Discharge: 2016-11-06 | Disposition: A | Discharge status: Home / Self Care | Payer: Medicare Other | Source: Ambulatory Visit | Attending: Radiation Oncology | Admitting: Radiation Oncology

## 2016-11-06 DIAGNOSIS — Z170 Estrogen receptor positive status [ER+]: Secondary | ICD-10-CM | POA: Diagnosis not present

## 2016-11-06 DIAGNOSIS — C541 Malignant neoplasm of endometrium: Secondary | ICD-10-CM | POA: Diagnosis not present

## 2016-11-06 DIAGNOSIS — Z510 Encounter for antineoplastic radiation therapy: Secondary | ICD-10-CM | POA: Diagnosis not present

## 2016-11-06 DIAGNOSIS — R05 Cough: Secondary | ICD-10-CM | POA: Diagnosis not present

## 2016-11-06 DIAGNOSIS — J45909 Unspecified asthma, uncomplicated: Secondary | ICD-10-CM | POA: Diagnosis not present

## 2016-11-06 DIAGNOSIS — J449 Chronic obstructive pulmonary disease, unspecified: Secondary | ICD-10-CM | POA: Diagnosis not present

## 2016-11-09 ENCOUNTER — Ambulatory Visit
Admission: RE | Admit: 2016-11-09 | Discharge: 2016-11-09 | Disposition: A | Discharge status: Home / Self Care | Payer: Medicare Other | Source: Ambulatory Visit | Attending: Radiation Oncology | Admitting: Radiation Oncology

## 2016-11-09 DIAGNOSIS — J45909 Unspecified asthma, uncomplicated: Secondary | ICD-10-CM | POA: Diagnosis not present

## 2016-11-09 DIAGNOSIS — Z510 Encounter for antineoplastic radiation therapy: Secondary | ICD-10-CM | POA: Diagnosis not present

## 2016-11-09 DIAGNOSIS — R05 Cough: Secondary | ICD-10-CM | POA: Diagnosis not present

## 2016-11-09 DIAGNOSIS — J449 Chronic obstructive pulmonary disease, unspecified: Secondary | ICD-10-CM | POA: Diagnosis not present

## 2016-11-09 DIAGNOSIS — Z170 Estrogen receptor positive status [ER+]: Secondary | ICD-10-CM | POA: Diagnosis not present

## 2016-11-09 DIAGNOSIS — C541 Malignant neoplasm of endometrium: Secondary | ICD-10-CM | POA: Diagnosis not present

## 2016-11-10 ENCOUNTER — Ambulatory Visit
Admission: RE | Admit: 2016-11-10 | Discharge: 2016-11-10 | Disposition: A | Discharge status: Home / Self Care | Payer: Medicare Other | Source: Ambulatory Visit | Attending: Radiation Oncology | Admitting: Radiation Oncology

## 2016-11-10 DIAGNOSIS — Z170 Estrogen receptor positive status [ER+]: Secondary | ICD-10-CM | POA: Diagnosis not present

## 2016-11-10 DIAGNOSIS — Z510 Encounter for antineoplastic radiation therapy: Secondary | ICD-10-CM | POA: Diagnosis not present

## 2016-11-10 DIAGNOSIS — C541 Malignant neoplasm of endometrium: Secondary | ICD-10-CM | POA: Diagnosis not present

## 2016-11-10 DIAGNOSIS — R05 Cough: Secondary | ICD-10-CM | POA: Diagnosis not present

## 2016-11-10 DIAGNOSIS — J45909 Unspecified asthma, uncomplicated: Secondary | ICD-10-CM | POA: Diagnosis not present

## 2016-11-10 DIAGNOSIS — J449 Chronic obstructive pulmonary disease, unspecified: Secondary | ICD-10-CM | POA: Diagnosis not present

## 2016-11-11 ENCOUNTER — Ambulatory Visit
Admission: RE | Admit: 2016-11-11 | Discharge: 2016-11-11 | Disposition: A | Discharge status: Home / Self Care | Payer: Medicare Other | Source: Ambulatory Visit | Attending: Radiation Oncology | Admitting: Radiation Oncology

## 2016-11-11 DIAGNOSIS — C541 Malignant neoplasm of endometrium: Secondary | ICD-10-CM | POA: Diagnosis not present

## 2016-11-11 DIAGNOSIS — R05 Cough: Secondary | ICD-10-CM | POA: Diagnosis not present

## 2016-11-11 DIAGNOSIS — Z170 Estrogen receptor positive status [ER+]: Secondary | ICD-10-CM | POA: Diagnosis not present

## 2016-11-11 DIAGNOSIS — J45909 Unspecified asthma, uncomplicated: Secondary | ICD-10-CM | POA: Diagnosis not present

## 2016-11-11 DIAGNOSIS — Z510 Encounter for antineoplastic radiation therapy: Secondary | ICD-10-CM | POA: Diagnosis not present

## 2016-11-11 DIAGNOSIS — J449 Chronic obstructive pulmonary disease, unspecified: Secondary | ICD-10-CM | POA: Diagnosis not present

## 2016-11-12 ENCOUNTER — Inpatient Hospital Stay: Payer: Medicare Other

## 2016-11-12 ENCOUNTER — Ambulatory Visit
Admission: RE | Admit: 2016-11-12 | Discharge: 2016-11-12 | Disposition: A | Discharge status: Home / Self Care | Payer: Medicare Other | Source: Ambulatory Visit | Attending: Radiation Oncology | Admitting: Radiation Oncology

## 2016-11-12 DIAGNOSIS — C541 Malignant neoplasm of endometrium: Secondary | ICD-10-CM

## 2016-11-12 DIAGNOSIS — J45909 Unspecified asthma, uncomplicated: Secondary | ICD-10-CM | POA: Diagnosis not present

## 2016-11-12 DIAGNOSIS — Z510 Encounter for antineoplastic radiation therapy: Secondary | ICD-10-CM | POA: Diagnosis not present

## 2016-11-12 DIAGNOSIS — R05 Cough: Secondary | ICD-10-CM | POA: Diagnosis not present

## 2016-11-12 DIAGNOSIS — J449 Chronic obstructive pulmonary disease, unspecified: Secondary | ICD-10-CM | POA: Diagnosis not present

## 2016-11-12 DIAGNOSIS — Z170 Estrogen receptor positive status [ER+]: Secondary | ICD-10-CM | POA: Diagnosis not present

## 2016-11-12 LAB — CBC
HCT: 32.9 % — ABNORMAL LOW (ref 35.0–47.0)
Hemoglobin: 11.2 g/dL — ABNORMAL LOW (ref 12.0–16.0)
MCH: 28.9 pg (ref 26.0–34.0)
MCHC: 34 g/dL (ref 32.0–36.0)
MCV: 85 fL (ref 80.0–100.0)
Platelets: 317 10*3/uL (ref 150–440)
RBC: 3.87 MIL/uL (ref 3.80–5.20)
RDW: 16.4 % — ABNORMAL HIGH (ref 11.5–14.5)
WBC: 5.1 10*3/uL (ref 3.6–11.0)

## 2016-11-13 ENCOUNTER — Ambulatory Visit
Admission: RE | Admit: 2016-11-13 | Discharge: 2016-11-13 | Disposition: A | Discharge status: Home / Self Care | Payer: Medicare Other | Source: Ambulatory Visit | Attending: Radiation Oncology | Admitting: Radiation Oncology

## 2016-11-13 DIAGNOSIS — Z170 Estrogen receptor positive status [ER+]: Secondary | ICD-10-CM | POA: Diagnosis not present

## 2016-11-13 DIAGNOSIS — J45909 Unspecified asthma, uncomplicated: Secondary | ICD-10-CM | POA: Diagnosis not present

## 2016-11-13 DIAGNOSIS — J449 Chronic obstructive pulmonary disease, unspecified: Secondary | ICD-10-CM | POA: Diagnosis not present

## 2016-11-13 DIAGNOSIS — R05 Cough: Secondary | ICD-10-CM | POA: Diagnosis not present

## 2016-11-13 DIAGNOSIS — Z510 Encounter for antineoplastic radiation therapy: Secondary | ICD-10-CM | POA: Diagnosis not present

## 2016-11-13 DIAGNOSIS — C541 Malignant neoplasm of endometrium: Secondary | ICD-10-CM | POA: Diagnosis not present

## 2016-11-16 ENCOUNTER — Ambulatory Visit
Admission: RE | Admit: 2016-11-16 | Discharge: 2016-11-16 | Disposition: A | Discharge status: Home / Self Care | Payer: Medicare Other | Source: Ambulatory Visit | Attending: Radiation Oncology | Admitting: Radiation Oncology

## 2016-11-16 DIAGNOSIS — R05 Cough: Secondary | ICD-10-CM | POA: Diagnosis not present

## 2016-11-16 DIAGNOSIS — C541 Malignant neoplasm of endometrium: Secondary | ICD-10-CM | POA: Diagnosis not present

## 2016-11-16 DIAGNOSIS — J45909 Unspecified asthma, uncomplicated: Secondary | ICD-10-CM | POA: Diagnosis not present

## 2016-11-16 DIAGNOSIS — J449 Chronic obstructive pulmonary disease, unspecified: Secondary | ICD-10-CM | POA: Diagnosis not present

## 2016-11-16 DIAGNOSIS — Z170 Estrogen receptor positive status [ER+]: Secondary | ICD-10-CM | POA: Diagnosis not present

## 2016-11-16 DIAGNOSIS — Z510 Encounter for antineoplastic radiation therapy: Secondary | ICD-10-CM | POA: Diagnosis not present

## 2016-11-17 ENCOUNTER — Ambulatory Visit
Admission: RE | Admit: 2016-11-17 | Discharge: 2016-11-17 | Disposition: A | Discharge status: Home / Self Care | Payer: Medicare Other | Source: Ambulatory Visit | Attending: Radiation Oncology | Admitting: Radiation Oncology

## 2016-11-17 DIAGNOSIS — Z510 Encounter for antineoplastic radiation therapy: Secondary | ICD-10-CM | POA: Diagnosis not present

## 2016-11-17 DIAGNOSIS — Z170 Estrogen receptor positive status [ER+]: Secondary | ICD-10-CM | POA: Diagnosis not present

## 2016-11-17 DIAGNOSIS — C541 Malignant neoplasm of endometrium: Secondary | ICD-10-CM | POA: Diagnosis not present

## 2016-11-17 DIAGNOSIS — R05 Cough: Secondary | ICD-10-CM | POA: Diagnosis not present

## 2016-11-17 DIAGNOSIS — J449 Chronic obstructive pulmonary disease, unspecified: Secondary | ICD-10-CM | POA: Diagnosis not present

## 2016-11-17 DIAGNOSIS — J45909 Unspecified asthma, uncomplicated: Secondary | ICD-10-CM | POA: Diagnosis not present

## 2016-11-18 ENCOUNTER — Ambulatory Visit: Payer: Medicare Other

## 2016-11-18 ENCOUNTER — Ambulatory Visit
Admission: RE | Admit: 2016-11-18 | Discharge: 2016-11-18 | Disposition: A | Discharge status: Home / Self Care | Payer: Medicare Other | Source: Ambulatory Visit | Attending: Radiation Oncology | Admitting: Radiation Oncology

## 2016-11-18 DIAGNOSIS — Z170 Estrogen receptor positive status [ER+]: Secondary | ICD-10-CM | POA: Diagnosis not present

## 2016-11-18 DIAGNOSIS — Z510 Encounter for antineoplastic radiation therapy: Secondary | ICD-10-CM | POA: Diagnosis not present

## 2016-11-18 DIAGNOSIS — C541 Malignant neoplasm of endometrium: Secondary | ICD-10-CM | POA: Diagnosis not present

## 2016-11-18 DIAGNOSIS — J449 Chronic obstructive pulmonary disease, unspecified: Secondary | ICD-10-CM | POA: Diagnosis not present

## 2016-11-18 DIAGNOSIS — R05 Cough: Secondary | ICD-10-CM | POA: Diagnosis not present

## 2016-11-18 DIAGNOSIS — J45909 Unspecified asthma, uncomplicated: Secondary | ICD-10-CM | POA: Diagnosis not present

## 2016-11-19 ENCOUNTER — Ambulatory Visit: Payer: Medicare Other

## 2016-11-19 ENCOUNTER — Ambulatory Visit
Admission: RE | Admit: 2016-11-19 | Discharge: 2016-11-19 | Disposition: A | Discharge status: Home / Self Care | Payer: Medicare Other | Source: Ambulatory Visit | Attending: Radiation Oncology | Admitting: Radiation Oncology

## 2016-11-19 DIAGNOSIS — Z170 Estrogen receptor positive status [ER+]: Secondary | ICD-10-CM | POA: Diagnosis not present

## 2016-11-19 DIAGNOSIS — C541 Malignant neoplasm of endometrium: Secondary | ICD-10-CM | POA: Diagnosis not present

## 2016-11-19 DIAGNOSIS — J449 Chronic obstructive pulmonary disease, unspecified: Secondary | ICD-10-CM | POA: Diagnosis not present

## 2016-11-19 DIAGNOSIS — Z510 Encounter for antineoplastic radiation therapy: Secondary | ICD-10-CM | POA: Diagnosis not present

## 2016-11-19 DIAGNOSIS — R05 Cough: Secondary | ICD-10-CM | POA: Diagnosis not present

## 2016-11-19 DIAGNOSIS — J45909 Unspecified asthma, uncomplicated: Secondary | ICD-10-CM | POA: Diagnosis not present

## 2016-11-20 ENCOUNTER — Ambulatory Visit
Admission: RE | Admit: 2016-11-20 | Discharge: 2016-11-20 | Disposition: A | Discharge status: Home / Self Care | Payer: Medicare Other | Source: Ambulatory Visit | Attending: Radiation Oncology | Admitting: Radiation Oncology

## 2016-11-20 DIAGNOSIS — Z170 Estrogen receptor positive status [ER+]: Secondary | ICD-10-CM | POA: Diagnosis not present

## 2016-11-20 DIAGNOSIS — R05 Cough: Secondary | ICD-10-CM | POA: Diagnosis not present

## 2016-11-20 DIAGNOSIS — J45909 Unspecified asthma, uncomplicated: Secondary | ICD-10-CM | POA: Diagnosis not present

## 2016-11-20 DIAGNOSIS — J449 Chronic obstructive pulmonary disease, unspecified: Secondary | ICD-10-CM | POA: Diagnosis not present

## 2016-11-20 DIAGNOSIS — Z510 Encounter for antineoplastic radiation therapy: Secondary | ICD-10-CM | POA: Diagnosis not present

## 2016-11-20 DIAGNOSIS — C541 Malignant neoplasm of endometrium: Secondary | ICD-10-CM | POA: Diagnosis not present

## 2016-11-25 ENCOUNTER — Ambulatory Visit
Admission: RE | Admit: 2016-11-25 | Discharge: 2016-11-25 | Disposition: A | Discharge status: Home / Self Care | Payer: Medicare Other | Source: Ambulatory Visit | Attending: Radiation Oncology | Admitting: Radiation Oncology

## 2016-11-25 DIAGNOSIS — Z170 Estrogen receptor positive status [ER+]: Secondary | ICD-10-CM | POA: Diagnosis not present

## 2016-11-25 DIAGNOSIS — C541 Malignant neoplasm of endometrium: Secondary | ICD-10-CM | POA: Diagnosis not present

## 2016-11-25 DIAGNOSIS — J45909 Unspecified asthma, uncomplicated: Secondary | ICD-10-CM | POA: Diagnosis not present

## 2016-11-25 DIAGNOSIS — R05 Cough: Secondary | ICD-10-CM | POA: Diagnosis not present

## 2016-11-25 DIAGNOSIS — J449 Chronic obstructive pulmonary disease, unspecified: Secondary | ICD-10-CM | POA: Diagnosis not present

## 2016-11-25 DIAGNOSIS — Z510 Encounter for antineoplastic radiation therapy: Secondary | ICD-10-CM | POA: Diagnosis not present

## 2016-11-26 DIAGNOSIS — J449 Chronic obstructive pulmonary disease, unspecified: Secondary | ICD-10-CM | POA: Diagnosis not present

## 2016-11-26 DIAGNOSIS — C541 Malignant neoplasm of endometrium: Secondary | ICD-10-CM | POA: Diagnosis not present

## 2016-11-26 DIAGNOSIS — Z170 Estrogen receptor positive status [ER+]: Secondary | ICD-10-CM | POA: Diagnosis not present

## 2016-11-26 DIAGNOSIS — J45909 Unspecified asthma, uncomplicated: Secondary | ICD-10-CM | POA: Diagnosis not present

## 2016-11-26 DIAGNOSIS — Z510 Encounter for antineoplastic radiation therapy: Secondary | ICD-10-CM | POA: Diagnosis not present

## 2016-11-26 DIAGNOSIS — R05 Cough: Secondary | ICD-10-CM | POA: Diagnosis not present

## 2016-12-01 ENCOUNTER — Ambulatory Visit
Admission: RE | Admit: 2016-12-01 | Discharge: 2016-12-01 | Disposition: A | Discharge status: Home / Self Care | Payer: Medicare Other | Source: Ambulatory Visit | Attending: Radiation Oncology | Admitting: Radiation Oncology

## 2016-12-01 DIAGNOSIS — Z510 Encounter for antineoplastic radiation therapy: Secondary | ICD-10-CM | POA: Diagnosis not present

## 2016-12-01 DIAGNOSIS — Z170 Estrogen receptor positive status [ER+]: Secondary | ICD-10-CM | POA: Diagnosis not present

## 2016-12-01 DIAGNOSIS — J45909 Unspecified asthma, uncomplicated: Secondary | ICD-10-CM | POA: Diagnosis not present

## 2016-12-01 DIAGNOSIS — R05 Cough: Secondary | ICD-10-CM | POA: Diagnosis not present

## 2016-12-01 DIAGNOSIS — C541 Malignant neoplasm of endometrium: Secondary | ICD-10-CM | POA: Diagnosis not present

## 2016-12-01 DIAGNOSIS — J449 Chronic obstructive pulmonary disease, unspecified: Secondary | ICD-10-CM | POA: Diagnosis not present

## 2016-12-03 ENCOUNTER — Ambulatory Visit
Admission: RE | Admit: 2016-12-03 | Discharge: 2016-12-03 | Disposition: A | Discharge status: Home / Self Care | Payer: Medicare Other | Source: Ambulatory Visit | Attending: Radiation Oncology | Admitting: Radiation Oncology

## 2016-12-03 DIAGNOSIS — J449 Chronic obstructive pulmonary disease, unspecified: Secondary | ICD-10-CM | POA: Diagnosis not present

## 2016-12-03 DIAGNOSIS — Z510 Encounter for antineoplastic radiation therapy: Secondary | ICD-10-CM | POA: Diagnosis not present

## 2016-12-03 DIAGNOSIS — Z170 Estrogen receptor positive status [ER+]: Secondary | ICD-10-CM | POA: Diagnosis not present

## 2016-12-03 DIAGNOSIS — J45909 Unspecified asthma, uncomplicated: Secondary | ICD-10-CM | POA: Diagnosis not present

## 2016-12-03 DIAGNOSIS — C541 Malignant neoplasm of endometrium: Secondary | ICD-10-CM | POA: Diagnosis not present

## 2016-12-03 DIAGNOSIS — R05 Cough: Secondary | ICD-10-CM | POA: Diagnosis not present

## 2016-12-08 ENCOUNTER — Ambulatory Visit
Admission: RE | Admit: 2016-12-08 | Discharge: 2016-12-08 | Disposition: A | Discharge status: Home / Self Care | Payer: Medicare Other | Source: Ambulatory Visit | Attending: Radiation Oncology | Admitting: Radiation Oncology

## 2016-12-08 DIAGNOSIS — Z170 Estrogen receptor positive status [ER+]: Secondary | ICD-10-CM | POA: Diagnosis not present

## 2016-12-08 DIAGNOSIS — C541 Malignant neoplasm of endometrium: Secondary | ICD-10-CM | POA: Diagnosis not present

## 2016-12-08 DIAGNOSIS — R05 Cough: Secondary | ICD-10-CM | POA: Diagnosis not present

## 2016-12-08 DIAGNOSIS — J449 Chronic obstructive pulmonary disease, unspecified: Secondary | ICD-10-CM | POA: Diagnosis not present

## 2016-12-08 DIAGNOSIS — J45909 Unspecified asthma, uncomplicated: Secondary | ICD-10-CM | POA: Diagnosis not present

## 2016-12-08 DIAGNOSIS — Z510 Encounter for antineoplastic radiation therapy: Secondary | ICD-10-CM | POA: Diagnosis not present

## 2016-12-10 IMAGING — CR DG CHEST 2V
2 series · 2 of 2 positions shown · non-contrast
Comparison: September 25, 2013

CLINICAL DATA: Cough for several weeks.

EXAM:
CHEST  2 VIEW

[chest pa]
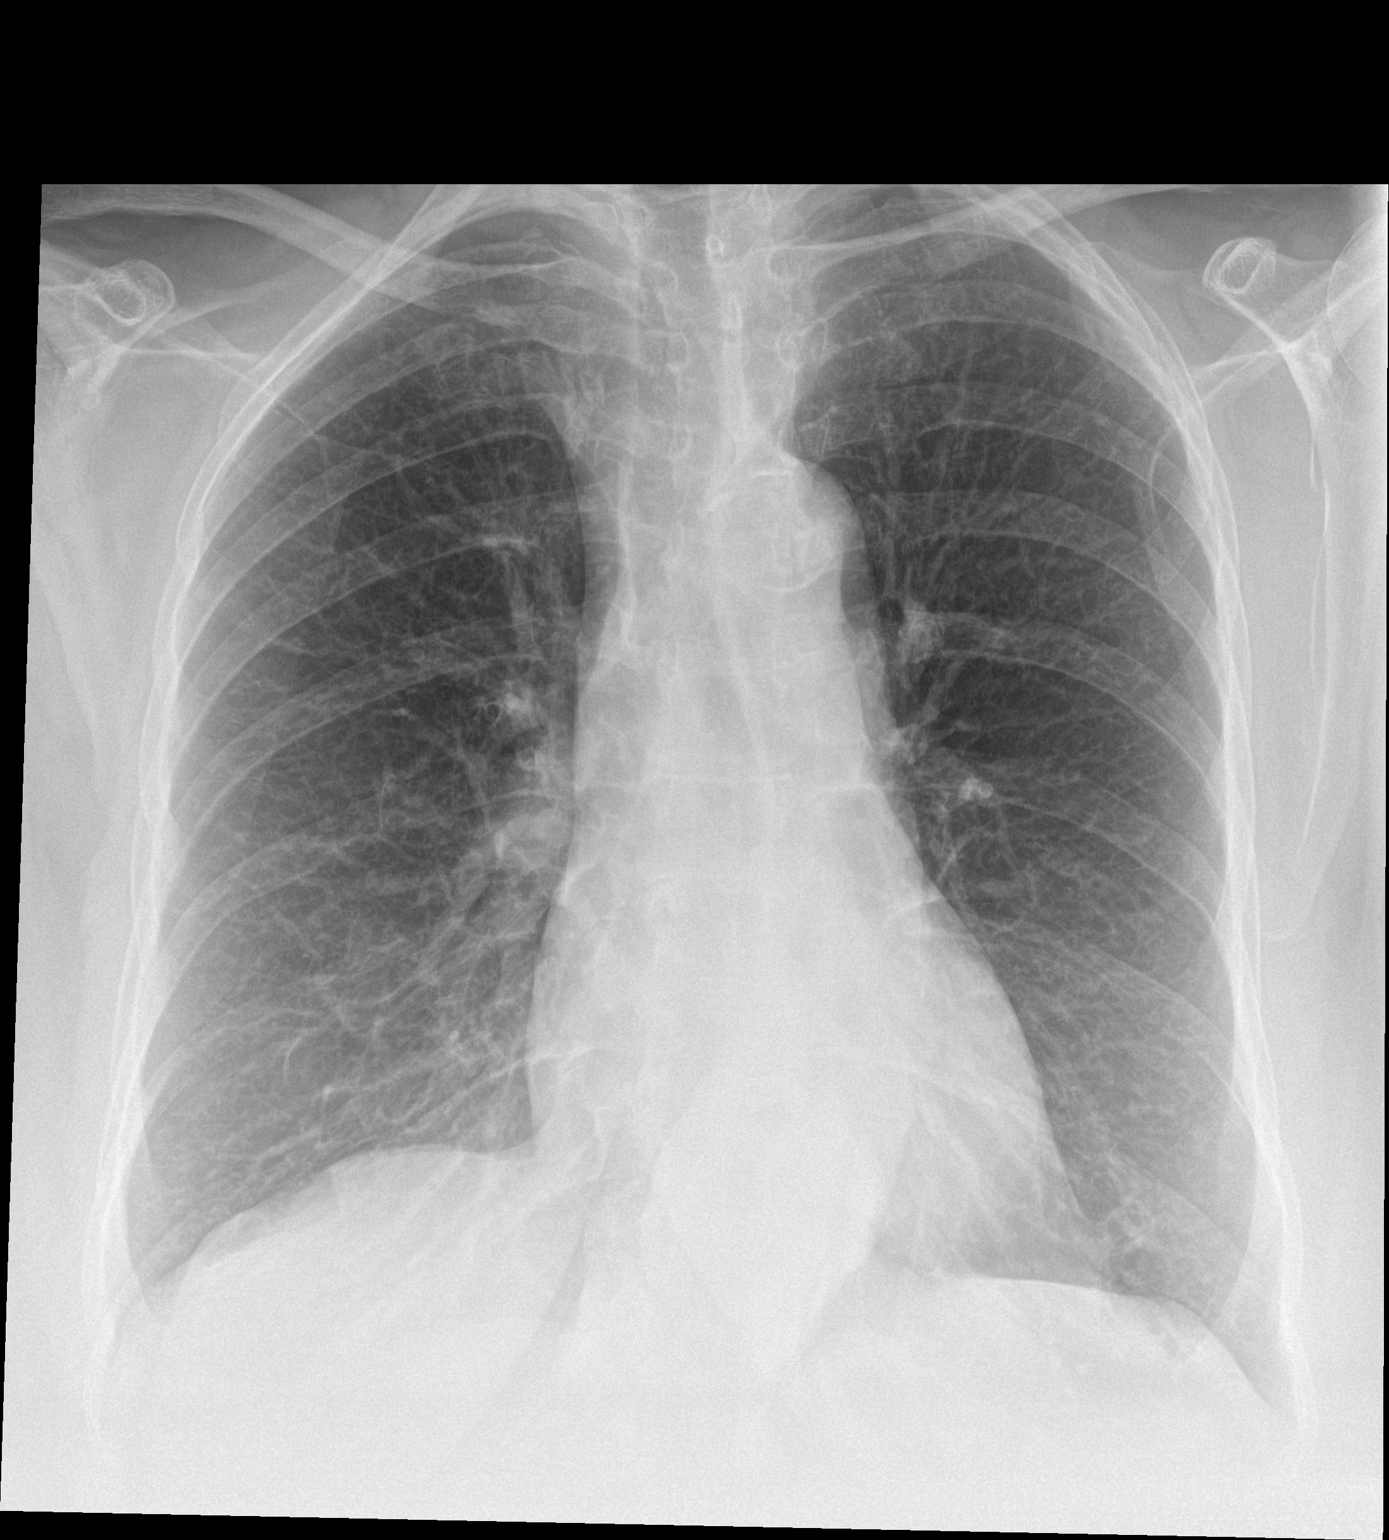

[chest lat]
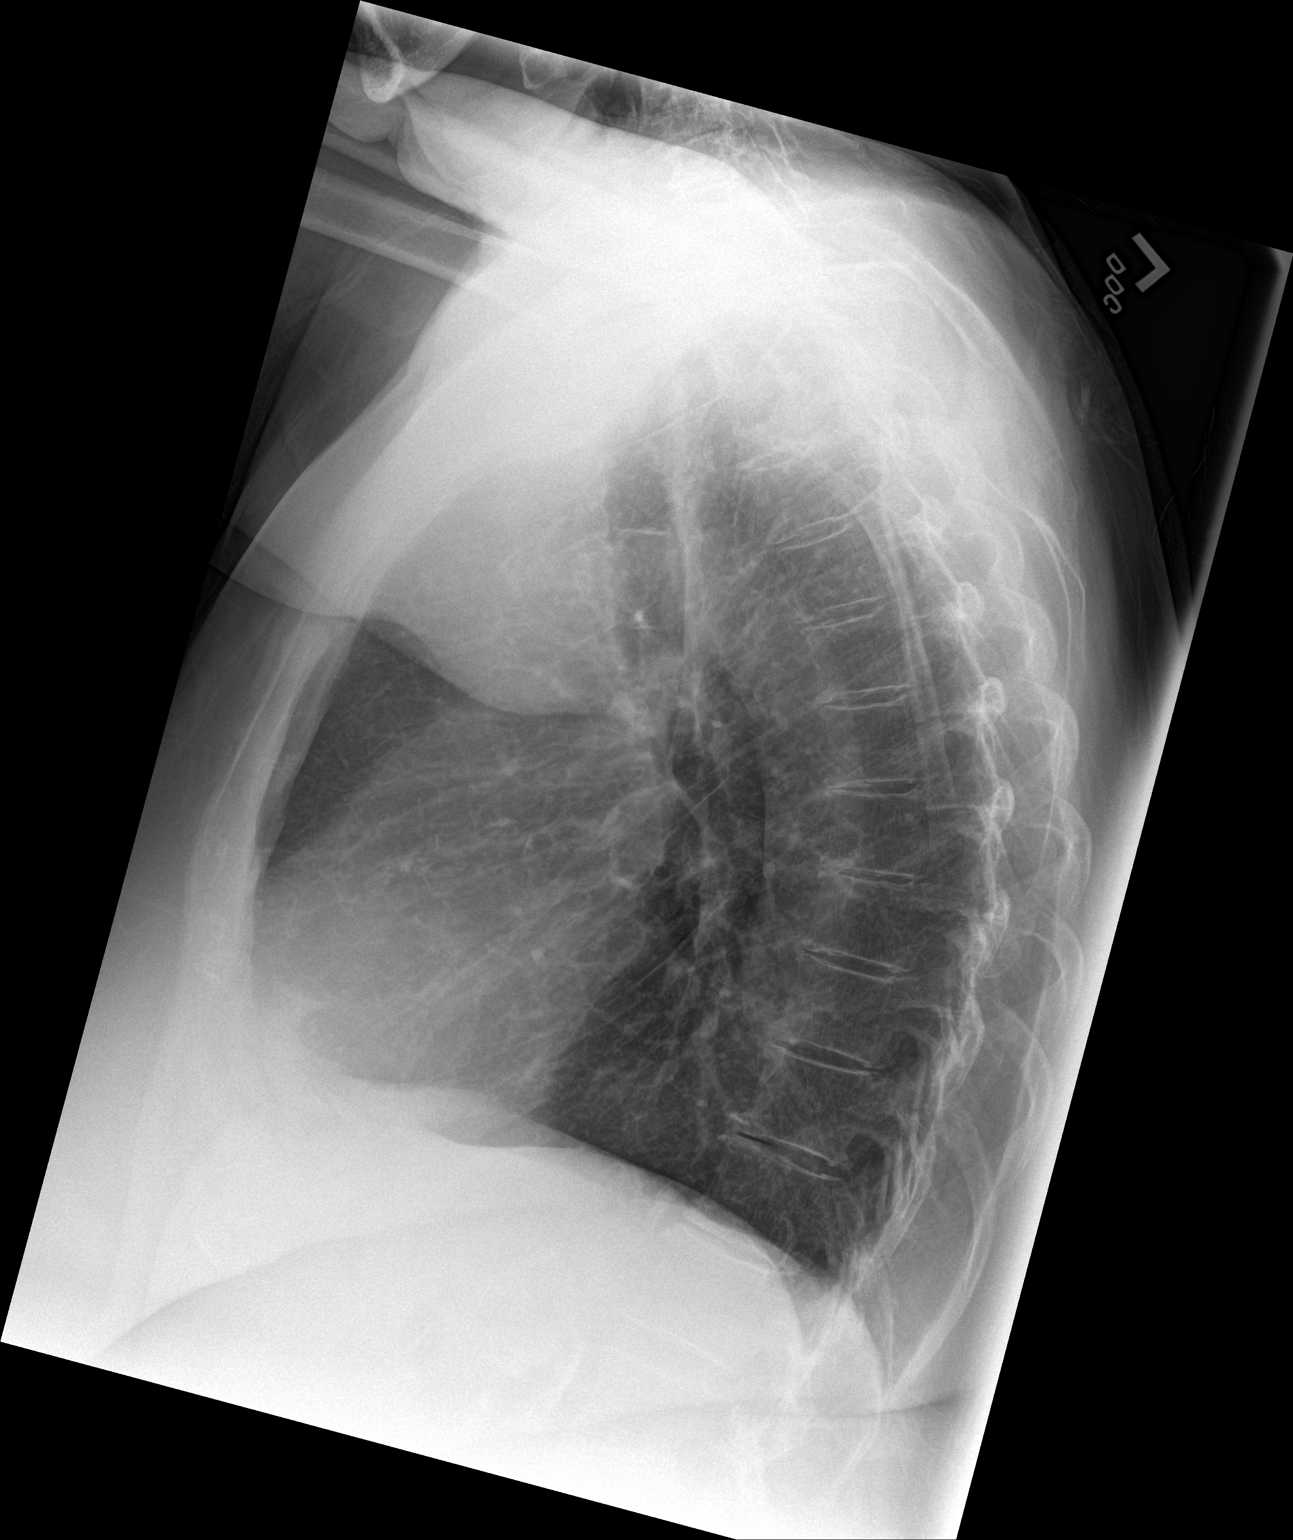

[2 of 2 positions shown; findings below may reference images not displayed]

FINDINGS: The heart size and mediastinal contours are within normal limits.
There is a hiatal hernia. There is no focal infiltrate, pulmonary
edema, or pleural effusion. Degenerative joint changes of the spine
are noted. The visualized skeletal structures are unremarkable.
IMPRESSION: No active cardiopulmonary disease.

## 2016-12-14 DIAGNOSIS — E119 Type 2 diabetes mellitus without complications: Secondary | ICD-10-CM | POA: Diagnosis not present

## 2016-12-14 DIAGNOSIS — C541 Malignant neoplasm of endometrium: Secondary | ICD-10-CM | POA: Diagnosis not present

## 2016-12-14 DIAGNOSIS — J4540 Moderate persistent asthma, uncomplicated: Secondary | ICD-10-CM | POA: Diagnosis not present

## 2016-12-14 DIAGNOSIS — E039 Hypothyroidism, unspecified: Secondary | ICD-10-CM | POA: Diagnosis not present

## 2016-12-14 DIAGNOSIS — E782 Mixed hyperlipidemia: Secondary | ICD-10-CM | POA: Diagnosis not present

## 2016-12-14 DIAGNOSIS — I10 Essential (primary) hypertension: Secondary | ICD-10-CM | POA: Diagnosis not present

## 2016-12-16 ENCOUNTER — Ambulatory Visit: Payer: Medicare Other

## 2016-12-16 ENCOUNTER — Encounter: Payer: Self-pay | Admitting: Obstetrics and Gynecology

## 2016-12-16 ENCOUNTER — Inpatient Hospital Stay: Payer: Medicare Other | Attending: Obstetrics and Gynecology | Admitting: Obstetrics and Gynecology

## 2016-12-16 VITALS — BP 150/78 | HR 82 | Temp 97.0°F | Resp 18 | Ht 60.0 in | Wt 150.1 lb

## 2016-12-16 DIAGNOSIS — Z90710 Acquired absence of both cervix and uterus: Secondary | ICD-10-CM | POA: Diagnosis not present

## 2016-12-16 DIAGNOSIS — Z8542 Personal history of malignant neoplasm of other parts of uterus: Secondary | ICD-10-CM | POA: Insufficient documentation

## 2016-12-16 DIAGNOSIS — Z90722 Acquired absence of ovaries, bilateral: Secondary | ICD-10-CM | POA: Insufficient documentation

## 2016-12-16 DIAGNOSIS — Z170 Estrogen receptor positive status [ER+]: Secondary | ICD-10-CM | POA: Diagnosis not present

## 2016-12-16 DIAGNOSIS — Z923 Personal history of irradiation: Secondary | ICD-10-CM | POA: Diagnosis not present

## 2016-12-16 DIAGNOSIS — C541 Malignant neoplasm of endometrium: Secondary | ICD-10-CM

## 2016-12-16 DIAGNOSIS — E119 Type 2 diabetes mellitus without complications: Secondary | ICD-10-CM | POA: Insufficient documentation

## 2016-12-16 DIAGNOSIS — J449 Chronic obstructive pulmonary disease, unspecified: Secondary | ICD-10-CM | POA: Insufficient documentation

## 2016-12-16 DIAGNOSIS — I10 Essential (primary) hypertension: Secondary | ICD-10-CM | POA: Diagnosis not present

## 2016-12-16 DIAGNOSIS — E039 Hypothyroidism, unspecified: Secondary | ICD-10-CM

## 2016-12-16 DIAGNOSIS — E785 Hyperlipidemia, unspecified: Secondary | ICD-10-CM | POA: Diagnosis not present

## 2016-12-16 DIAGNOSIS — Z79899 Other long term (current) drug therapy: Secondary | ICD-10-CM | POA: Insufficient documentation

## 2016-12-16 MED ORDER — MEGESTROL ACETATE 40 MG PO TABS: 40.0000 mg | ORAL_TABLET | Freq: Two times a day (BID) | ORAL | 6 refills | Status: DC

## 2016-12-16 NOTE — Progress Notes (Signed)
Occ. Diarrhea from treatment and now better. She takes imodium and it clears up. No gyn concerns.

## 2016-12-16 NOTE — Progress Notes (Signed)
Gynecologic Oncology Consult Visit   Referring Provider: Dr. Vikki Ports Ward  Chief Concern: Endometrial cancer  Subjective:  Anita Kent is a 76 y.o. P4 female who is seen in consultation from Dr. Leonides Schanz for endometrial cancer.  She completed radiation treatment last week and tolerated this well. Some diarrhea with radiation and this was treated with imodium.  Some mild SOB and was taking Predisone for COPD flair.   Oncology History Menopause age 70, no hormone replacement. Postmenopausal bleeding episodes noted October 2017 at primary care visit and seen eventually by Dr Leonides Schanz. She had an ultrasound in May 2018 which showed many fibroids, and a mass in the endometrial cavity.  D&C with hysteroscopy was being set up, but Cardiac Clearance was delayed  D&C 09/04/16: Uterus, mobile, normal size with grade 2 descent, sounding to 10 cm; normal cervix, atrophic vagina without lesions, normal perineum. Difficult visualization due to active bleeding, no discreet mass able to be identified, but fluffy tissue with prominent blood vessels seen.  Moderate tissue on curettage.  Upon re-entry with scope, one small lobe of fatty tissue identified, at the same time the fluid deficit instantly and dramatically increased.  In view of this she had laparoscopy and upon entry to the abdomen, about 400cc of thin bloody fluid was encountered.  The uterus was observed to have a perforation in the right cornua that was oozing, and would bleed with any manipulation.  From that defect was endometrial tissue protruding and falling into the abdomen.  Otherwise unremarkable pelvis and upper abdomen.  Bilateral tubes had been surgically altered, and bladder was thinly adherent to anterior LUS.  NO nodularity on peritoneal surfaces.  TLH/BSO performed in view of perforation and probable cancer.   Pathology 09/04/16 DIAGNOSIS:  A. ENDOMETRIUM; CURETTAGE:  - ENDOMETRIOID CARCINOMA, FIGO GRADE 1.   B. UTERUS WITH CERVIX;  HYSTERECTOMY:  - ENDOMETRIOID CARCINOMA, FIGO GRADE 1, WITH DEEP MYOMETRIAL INVASION.  - NO DEFINITE CERVICAL INVOLVEMENT OR SEROSAL INVOLVEMENT.  - INTRAMURAL LEIOMYOMAS.   RIGHT FALLOPIAN TUBE; SALPINGECTOMY:  - INVOLVED BY ENDOMETRIOID CARCINOMA WITH MUSCLE INVASION.   LEFT FALLOPIAN TUBE; SALPINGECTOMY:  - NEGATIVE FOR MALIGNANCY.   RIGHT AND LEFT OVARIES; OOPHORECTOMY:  - NEGATIVE FOR MALIGNANCY.   Myometrial Invasion: Present    Depth of invasion: 15.5 mm    Myometrial thickness: 16 mm    Percentage of myometrial invasion; 97%    Distance from serosa at deepest invasion: 0.5 mm  Uterine Serosa Involvement: Not identified  Cervical Stromal Involvement: Not identified  Other Tissue/Organ Involvement: Right fallopian tube  Peritoneal/ Ascitic Fluid: Not submitted  Lymphovascular Invasion: Not identified  Regional Lymph Nodes: Not submitted  Pathologic Stage Classification (pTNM, AJCC 8th Edition): pT3a pNX/  FIGO IIIA    IHC Testing for Estrogen and Progesterone receptors:  Results: Estrogen receptor (ER) status: POSITIVE, >90% of cells with nuclear  Positivity.  Progesterone receptor (PR) status: POSITIVE, >90% of cells with nuclear  positivity  Average intensity of staining, ER and PR: Strong     MLH1: Intact nuclear expression  MSH2: Intact nuclear expression  MSH6: Intact nuclear expression  PMS2: Intact nuclear expression    8/18 CT scan was done to r/o metastatic disease. CT scan IMPRESSION: 1. Status post hysterectomy and bilateral salpingo oophorectomy. 2. Within the left iliac fossa there is a soft tissue attenuating structure with a short axis of 1.3 cm and is suspicious for adenopathy. Suggest further evaluation with PET-CT. 3. No evidence for distant metastatic disease.  4. Hiatal hernia 5. Aortic Atherosclerosis (ICD10-I70.0). Multi vessel coronary artery calcifications.  8/18 PET IMPRESSION: 1. The lesion of concern along the left  pelvis is that the immediate termination of the ovarian vein and has only low-grade activity which would be unusual for active malignancy. The appearance suggests a small left ovarian remnant or dilated termination of the ovarian vein rather than adenopathy although likely merits surveillance by cross-sectional imaging. 2. Extensive paranasal sinusitis. 3. Photopenic hepatic cysts. 4. Other imaging findings of potential clinical significance: Moderate-sized hiatal hernia. Aortic Atherosclerosis (ICD10-I70.0). Coronary atherosclerosis. Cholelithiasis.  She was seen in consultation by [place] and decision was made to give adjuvant radiation and she received external pelvic radiation + vaginal brachytherapy.   Problem List: Patient Active Problem List   Diagnosis Date Noted  . Endometrial cancer, FIGO stage IIIA ([place]) 09/16/2016  . Sinusitis, acute 08/13/2014  . Emphysema lung ([place]) 05/08/2014  . Asthma, chronic 03/06/2014  . Cough 03/06/2014    Past Medical History: Past Medical History:  Diagnosis Date  . Asthma   . COPD (chronic obstructive pulmonary disease) ([place])   . Cough variant asthma   . Diabetes ([place])    states was told she was not diabetic tested high once on hemoglobin A1C  . Dyslipidemia   . endometrial cancer 08/2016   Total Hysterectomy 09/04/2016  . HTN (hypertension)   . Hypertension   . Hypothyroidism   . Thyroid disease     Past Surgical History: Past Surgical History:  Procedure Laterality Date  . ABDOMINAL HYSTERECTOMY    . CATARACT EXTRACTION, BILATERAL    . CYSTOSCOPY N/A 09/04/2016   Procedure: CYSTOSCOPY;  Surgeon: Ward, Honor Loh, MD;  Location: [facility];  Service: Gynecology;  Laterality: N/A;  . DILATATION & CURETTAGE/HYSTEROSCOPY WITH MYOSURE N/A 09/04/2016   Procedure: DILATATION & CURETTAGE/HYSTEROSCOPY WITH MYOSURE;  Surgeon: Ward, Honor Loh, MD;  Location: [facility];  Service: Gynecology;  Laterality: N/A;  . GANGLION CYST EXCISION  Left    hand  . LAPAROSCOPIC BILATERAL SALPINGO OOPHERECTOMY Bilateral 09/04/2016   Procedure: LAPAROSCOPIC BILATERAL SALPINGO OOPHORECTOMY;  Surgeon: Ward, Honor Loh, MD;  Location: [facility];  Service: Gynecology;  Laterality: Bilateral;  . LAPAROSCOPIC HYSTERECTOMY  09/04/2016   Procedure: HYSTERECTOMY TOTAL LAPAROSCOPIC;  Surgeon: Ward, Honor Loh, MD;  Location: [facility];  Service: Gynecology;;  . primary open reduction procedure Right 11/09/2013    right ankle  . TONSILLECTOMY    . TUBAL LIGATION        Family History: Family History  Problem Relation Age of Onset  . Myasthenia gravis Daughter   . Emphysema Mother   . COPD Mother   . Dementia Mother   . Stroke Father   . Heart attack Sister     Social History: Social History   Social History  . Marital status: Married    Spouse name: N/A  . Number of children: N/A  . Years of education: N/A   Occupational History  . Not on file.   Social History Main Topics  . Smoking status: Never Smoker  . Smokeless tobacco: Never Used  . Alcohol use No  . Drug use: No  . Sexual activity: No   Other Topics Concern  . Not on file   Social History Narrative  . No narrative on file    Allergies: Allergies  Allergen Reactions  . Oxycodone Itching  . Sulfa Antibiotics Hives  . Amoxicillin Hives and Rash    Has patient had a PCN reaction  causing immediate rash, facial/tongue/throat swelling, SOB or lightheadedness with hypotension: Yes Has patient had a PCN reaction causing severe rash involving mucus membranes or skin necrosis: Yes Has patient had a PCN reaction that required hospitalization: No Has patient had a PCN reaction occurring within the last 10 years: Yes If all of the above answers are "NO", then may proceed with Cephalosporin use.   Diona Fanti [Aspirin] Hives  . Levaquin [Levofloxacin In D5w] Hives    Current Medications: Current Outpatient Prescriptions  Medication Sig Dispense Refill  . albuterol (PROVENTIL)  (2.5 MG/3ML) 0.083% nebulizer solution Take 2.5 mg by nebulization every 6 (six) hours as needed for wheezing or shortness of breath.    . Albuterol Sulfate (PROAIR RESPICLICK) 998 (90 BASE) MCG/ACT AEPB Inhale 2 puffs into the lungs every 6 (six) hours as needed. 1 each 11  . fluticasone (FLONASE) 50 MCG/ACT nasal spray Place 1 spray into both nostrils daily as needed for congestion.    . Fluticasone-Salmeterol (ADVAIR) 250-50 MCG/DOSE AEPB Inhale 1 puff into the lungs 2 (two) times daily.    Marland Kitchen ibuprofen (ADVIL,MOTRIN) 200 MG tablet Take 400 mg by mouth as needed for headache or moderate pain.     Marland Kitchen levothyroxine (SYNTHROID, LEVOTHROID) 75 MCG tablet Take 75 mcg by mouth daily before breakfast.    . loperamide (IMODIUM A-D) 2 MG tablet Take 2 mg by mouth 4 (four) times daily as needed for diarrhea or loose stools.    Marland Kitchen loratadine (CLARITIN) 10 MG tablet Take 10 mg by mouth daily.    Marland Kitchen losartan-hydrochlorothiazide (HYZAAR) 50-12.5 MG per tablet Take 1 tablet by mouth daily.    Marland Kitchen omeprazole (PRILOSEC OTC) 20 MG tablet Take 20 mg by mouth daily.     . predniSONE (DELTASONE) 10 MG tablet Take 1 tablet (10 mg total) by mouth daily with breakfast. 12 day taper (60 mg x2 days, 50 mg x2 days, ...decrease by 10 mg every 2 days) 42 tablet 0  . Probiotic CAPS Take 1 capsule by mouth daily.     No current facility-administered medications for this visit.     Review of Systems General: negative for, fevers, chills, fatigue, changes in sleep, changes in weight or appetite Skin: negative for changes in color, texture, moles or lesions Eyes: negative for, changes in vision, pain, diplopia HEENT: negative for, change in hearing, pain, discharge, tinnitus, vertigo, voice changes, sore throat, neck masses Breasts: negative for breast lumps Pulmonary: negative for, dyspnea, orthopnea, productive cough Cardiac: negative for, palpitations, syncope, pain, discomfort, pressure Gastrointestinal: negative for,  dysphagia, nausea, vomiting, jaundice, pain, constipation, diarrhea, hematemesis, hematochezia Genitourinary/Sexual: negative for, dysuria, discharge, hesitancy, nocturia, retention, stones, infections, STD's, incontinence Ob/Gyn: negative for, irregular bleeding, pain Musculoskeletal: negative for, pain, stiffness, swelling, range of motion limitation Hematology: negative for, easy bruising, bleeding Neurologic/Psych: negative for, headaches, seizures, paralysis, weakness, tremor, change in gait, change in sensation, mood swings, depression, anxiety, change in memory  Objective:  Physical Examination:  BP (!) 150/78   Pulse 82   Temp (!) 97 F (36.1 C) (Tympanic)   Resp 18   Ht 5' (1.524 m)   Wt 150 lb 1.6 oz (68.1 kg)   BMI 29.31 kg/m    ECOG Performance Status: 0 - Asymptomatic  General appearance: alert, cooperative and appears stated age HEENT:extra ocular movement intact, neck supple with midline trachea and thyroid without masses Lymph node survey: non-palpable, axillary, inguinal, supraclavicular Cardiovascular: regular rate and rhythm, no murmurs or gallops Respiratory: normal air entry, lungs  clear to auscultation and no rales, rhonchi or wheezing Breast exam: not examined. Abdomen: no hernias and well healed incisions Back: inspection of back is normal Extremities: extremities normal, atraumatic, no cyanosis or edema Skin exam - normal coloration and turgor, no rashes, no suspicious skin lesions noted. Neurological exam reveals alert, oriented, normal speech, no focal findings or movement disorder noted.  Pelvic: exam chaperoned by nurse;  Vulva: normal appearing vulva with no masses, tenderness or lesions; Vagina: normal vagina; Adnexa: normal.  RV; no masses    Assessment:  GUELDA BATSON is a 76 y.o. 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  She had deep  almost full thickness myometrial invasion and fallopian tube metastasis with spillage of cancer from uterine perforation.    Now s/p pelvic radiation and no evidence of disease today.   IHC shows no loss of MMR gene expression.  Plan:   Problem List Items Addressed This Visit      Other   Endometrial cancer, FIGO stage IIIA ([place]) - Primary     We discussed options for management.  Recommend maintenance hormonal therapy with Megace 40 mg bid in view of ER/PR positive grade 1 cancer with deep invasion and no node sampling + adnexal involvement and uterine perforation.  Discussed potential side effects of DVT and weight gain.   The patient's diagnosis, an outline of the further diagnostic and laboratory studies which will be required, the recommendation, and alternatives were discussed.  All questions were answered to the patient's satisfaction.  She will RTC in 4 months for further follow up.  Anita Drown, MD  CC:  Marinda Elk, MD [place] [facility] [place], [place] [address] [phone]

## 2016-12-16 NOTE — Progress Notes (Signed)
  Oncology Nurse Navigator Documentation Chaperoned pelvic exam. Follow up in 4 months. Navigator Location: [facility] (12/16/16 1300)   )Navigator Encounter Type: Follow-up Appt (12/16/16 1300)                     Patient Visit Type: GynOnc (12/16/16 1300)                              Time Spent with Patient: 15 (12/16/16 1300)

## 2017-01-06 ENCOUNTER — Ambulatory Visit: Payer: Medicare Other | Admitting: Radiation Oncology

## 2017-01-13 ENCOUNTER — Ambulatory Visit: Payer: Medicare Other | Admitting: Radiation Oncology

## 2017-01-18 DIAGNOSIS — J4541 Moderate persistent asthma with (acute) exacerbation: Secondary | ICD-10-CM | POA: Diagnosis not present

## 2017-01-27 ENCOUNTER — Ambulatory Visit: Payer: Medicare Other | Attending: Radiation Oncology | Admitting: Radiation Oncology

## 2017-02-02 DIAGNOSIS — J40 Bronchitis, not specified as acute or chronic: Secondary | ICD-10-CM | POA: Diagnosis not present

## 2017-02-02 DIAGNOSIS — J019 Acute sinusitis, unspecified: Secondary | ICD-10-CM | POA: Diagnosis not present

## 2017-02-02 DIAGNOSIS — J4541 Moderate persistent asthma with (acute) exacerbation: Secondary | ICD-10-CM | POA: Diagnosis not present

## 2017-03-29 DIAGNOSIS — I10 Essential (primary) hypertension: Secondary | ICD-10-CM | POA: Diagnosis not present

## 2017-03-29 DIAGNOSIS — J4541 Moderate persistent asthma with (acute) exacerbation: Secondary | ICD-10-CM | POA: Diagnosis not present

## 2017-03-29 DIAGNOSIS — J019 Acute sinusitis, unspecified: Secondary | ICD-10-CM | POA: Diagnosis not present

## 2017-03-29 DIAGNOSIS — E119 Type 2 diabetes mellitus without complications: Secondary | ICD-10-CM | POA: Diagnosis not present

## 2017-04-14 ENCOUNTER — Ambulatory Visit: Payer: Medicare Other

## 2017-05-12 ENCOUNTER — Inpatient Hospital Stay: Payer: Medicare Other | Attending: Obstetrics and Gynecology | Admitting: Obstetrics and Gynecology

## 2017-05-12 VITALS — BP 164/80 | HR 92 | Temp 98.1°F | Resp 20 | Ht 60.0 in | Wt 152.9 lb

## 2017-05-12 DIAGNOSIS — C541 Malignant neoplasm of endometrium: Secondary | ICD-10-CM | POA: Diagnosis not present

## 2017-05-12 DIAGNOSIS — Z90722 Acquired absence of ovaries, bilateral: Secondary | ICD-10-CM

## 2017-05-12 DIAGNOSIS — Z90710 Acquired absence of both cervix and uterus: Secondary | ICD-10-CM | POA: Diagnosis not present

## 2017-05-12 DIAGNOSIS — Z170 Estrogen receptor positive status [ER+]: Secondary | ICD-10-CM | POA: Diagnosis not present

## 2017-05-12 DIAGNOSIS — Z79818 Long term (current) use of other agents affecting estrogen receptors and estrogen levels: Secondary | ICD-10-CM | POA: Insufficient documentation

## 2017-05-12 NOTE — Progress Notes (Signed)
Gynecologic Oncology Consult Visit   Referring Provider: Dr. Vikki Ports Ward  Chief Concern: Endometrial cancer  Subjective:  Anita Kent is a 77 y.o. P4 female who is seen in consultation from Dr. Leonides Schanz for endometrial cancer.  On maintenance hormonal therapy with Megace 40 mg bid in view of ER/PR positive grade 1 cancer with deep invasion and no node sampling + adnexal involvement and uterine perforation.     No weight gain or other new concerning symptoms.  Oncology History Menopause age 81, no hormone replacement. Postmenopausal bleeding episodes noted October 2017 at primary care visit and seen eventually by Dr Leonides Schanz. She had an ultrasound in May 2018 which showed many fibroids, and a mass in the endometrial cavity.  D&C with hysteroscopy was being set up, but Cardiac Clearance was delayed  D&C 09/04/16: Uterus, mobile, normal size with grade 2 descent, sounding to 10 cm; normal cervix, atrophic vagina without lesions, normal perineum. Difficult visualization due to active bleeding, no discreet mass able to be identified, but fluffy tissue with prominent blood vessels seen.  Moderate tissue on curettage.  Upon re-entry with scope, one small lobe of fatty tissue identified, at the same time the fluid deficit instantly and dramatically increased.  In view of this she had laparoscopy and upon entry to the abdomen, about 400cc of thin bloody fluid was encountered.  The uterus was observed to have a perforation in the right cornua that was oozing, and would bleed with any manipulation.  From that defect was endometrial tissue protruding and falling into the abdomen.  Otherwise unremarkable pelvis and upper abdomen.  Bilateral tubes had been surgically altered, and bladder was thinly adherent to anterior LUS.  NO nodularity on peritoneal surfaces.  TLH/BSO performed in view of perforation and probable cancer.   Pathology 09/04/16 DIAGNOSIS:  A. ENDOMETRIUM; CURETTAGE:  - ENDOMETRIOID  CARCINOMA, FIGO GRADE 1.   B. UTERUS WITH CERVIX; HYSTERECTOMY:  - ENDOMETRIOID CARCINOMA, FIGO GRADE 1, WITH DEEP MYOMETRIAL INVASION.  - NO DEFINITE CERVICAL INVOLVEMENT OR SEROSAL INVOLVEMENT.  - INTRAMURAL LEIOMYOMAS.   RIGHT FALLOPIAN TUBE; SALPINGECTOMY:  - INVOLVED BY ENDOMETRIOID CARCINOMA WITH MUSCLE INVASION.   LEFT FALLOPIAN TUBE; SALPINGECTOMY:  - NEGATIVE FOR MALIGNANCY.   RIGHT AND LEFT OVARIES; OOPHORECTOMY:  - NEGATIVE FOR MALIGNANCY.   Myometrial Invasion: Present    Depth of invasion: 15.5 mm    Myometrial thickness: 16 mm    Percentage of myometrial invasion; 97%    Distance from serosa at deepest invasion: 0.5 mm  Uterine Serosa Involvement: Not identified  Cervical Stromal Involvement: Not identified  Other Tissue/Organ Involvement: Right fallopian tube  Peritoneal/ Ascitic Fluid: Not submitted  Lymphovascular Invasion: Not identified  Regional Lymph Nodes: Not submitted  Pathologic Stage Classification (pTNM, AJCC 8th Edition): pT3a pNX/  FIGO IIIA    IHC Testing for Estrogen and Progesterone receptors:  Results: Estrogen receptor (ER) status: POSITIVE, >90% of cells with nuclear  Positivity.  Progesterone receptor (PR) status: POSITIVE, >90% of cells with nuclear  positivity  Average intensity of staining, ER and PR: Strong     MLH1: Intact nuclear expression  MSH2: Intact nuclear expression  MSH6: Intact nuclear expression  PMS2: Intact nuclear expression    8/18 CT scan was done to r/o metastatic disease. CT scan IMPRESSION: 1. Status post hysterectomy and bilateral salpingo oophorectomy. 2. Within the left iliac fossa there is a soft tissue attenuating structure with a short axis of 1.3 cm and is suspicious for adenopathy. Suggest further  evaluation with PET-CT. 3. No evidence for distant metastatic disease. 4. Hiatal hernia 5. Aortic Atherosclerosis (ICD10-I70.0). Multi vessel coronary artery calcifications.  8/18 PET  IMPRESSION: 1. The lesion of concern along the left pelvis is that the immediate termination of the ovarian vein and has only low-grade activity which would be unusual for active malignancy. The appearance suggests a small left ovarian remnant or dilated termination of the ovarian vein rather than adenopathy although likely merits surveillance by cross-sectional imaging. 2. Extensive paranasal sinusitis. 3. Photopenic hepatic cysts. 4. Other imaging findings of potential clinical significance: Moderate-sized hiatal hernia. Aortic Atherosclerosis (ICD10-I70.0). Coronary atherosclerosis. Cholelithiasis.  She was seen in consultation by [place] and decision was made to give adjuvant radiation and she received external pelvic radiation + vaginal brachytherapy.  Recommended maintenance hormonal therapy with Megace 40 mg bid in view of ER/PR positive grade 1 cancer with deep invasion and no node sampling + adnexal involvement and uterine perforation.     Problem List: Patient Active Problem List   Diagnosis Date Noted  . Endometrial cancer, FIGO stage IIIA ([place]) 09/16/2016  . Sinusitis, acute 08/13/2014  . Emphysema lung ([place]) 05/08/2014  . Asthma, chronic 03/06/2014  . Cough 03/06/2014    Past Medical History: Past Medical History:  Diagnosis Date  . Asthma   . COPD (chronic obstructive pulmonary disease) ([place])   . Cough variant asthma   . Diabetes ([place])    states was told she was not diabetic tested high once on hemoglobin A1C  . Dyslipidemia   . endometrial cancer 08/2016   Total Hysterectomy 09/04/2016  . HTN (hypertension)   . Hypertension   . Hypothyroidism   . Thyroid disease     Past Surgical History: Past Surgical History:  Procedure Laterality Date  . ABDOMINAL HYSTERECTOMY    . CATARACT EXTRACTION, BILATERAL    . CYSTOSCOPY N/A 09/04/2016   Procedure: CYSTOSCOPY;  Surgeon: Ward, Honor Loh, MD;  Location: [facility] ORS;  Service: Gynecology;  Laterality: N/A;   . DILATATION & CURETTAGE/HYSTEROSCOPY WITH MYOSURE N/A 09/04/2016   Procedure: DILATATION & CURETTAGE/HYSTEROSCOPY WITH MYOSURE;  Surgeon: Ward, Honor Loh, MD;  Location: [facility] OR[facility];  Service: Gynecology;  Laterality: N/A;  . GANGLION CYST EXCISION Left    hand  . LAPAROSCOPIC BILATERAL SALPINGO OOPHERECTOMY Bilateral 09/04/2016   Procedure: LAPAROSCOPIC BILATERAL SALPINGO OOPHORECTOMY;  Surgeon: Ward, Honor Loh, MD;  Location: [facility] ORS;  Service: Gynecology;  Laterality: Bilateral;  . LAPAROSCOPIC HYSTERECTOMY  09/04/2016   Procedure: HYSTERECTOMY TOTAL LAPAROSCOPIC;  Surgeon: Ward, Honor Loh, MD;  Location: [facility] ORS;  Service: Gynecology;;  . primary open reduction procedure Right 11/09/2013    right ankle  . TONSILLECTOMY    . TUBAL LIGATION        Family History: Family History  Problem Relation Age of Onset  . Myasthenia gravis Daughter   . Emphysema Mother   . COPD Mother   . Dementia Mother   . Stroke Father   . Heart attack Sister     Social History: Social History   Socioeconomic History  . Marital status: Married    Spouse name: Not on file  . Number of children: Not on file  . Years of education: Not on file  . Highest education level: Not on file  Occupational History  . Not on file  Social Needs  . Financial resource strain: Not on file  . Food insecurity:    Worry: Not on file    Inability: Not on file  .  Transportation needs:    Medical: Not on file    Non-medical: Not on file  Tobacco Use  . Smoking status: Never Smoker  . Smokeless tobacco: Never Used  Substance and Sexual Activity  . Alcohol use: No    Alcohol/week: 0.0 oz  . Drug use: No  . Sexual activity: Never  Lifestyle  . Physical activity:    Days per week: Not on file    Minutes per session: Not on file  . Stress: Not on file  Relationships  . Social connections:    Talks on phone: Not on file    Gets together: Not on file    Attends religious service: Not on file    Active member  of club or organization: Not on file    Attends meetings of clubs or organizations: Not on file    Relationship status: Not on file  . Intimate partner violence:    Fear of current or ex partner: Not on file    Emotionally abused: Not on file    Physically abused: Not on file    Forced sexual activity: Not on file  Other Topics Concern  . Not on file  Social History Narrative  . Not on file    Allergies: Allergies  Allergen Reactions  . Oxycodone Itching  . Sulfa Antibiotics Hives  . Amoxicillin Hives and Rash    Has patient had a PCN reaction causing immediate rash, facial/tongue/throat swelling, SOB or lightheadedness with hypotension: Yes Has patient had a PCN reaction causing severe rash involving mucus membranes or skin necrosis: Yes Has patient had a PCN reaction that required hospitalization: No Has patient had a PCN reaction occurring within the last 10 years: Yes If all of the above answers are "NO", then may proceed with Cephalosporin use.   Diona Fanti [Aspirin] Hives  . Levaquin [Levofloxacin In D5w] Hives    Current Medications: Current Outpatient Medications  Medication Sig Dispense Refill  . albuterol (PROVENTIL) (2.5 MG/3ML) 0.083% nebulizer solution Take 2.5 mg by nebulization every 6 (six) hours as needed for wheezing or shortness of breath.    . Albuterol Sulfate (PROAIR RESPICLICK) 277 (90 BASE) MCG/ACT AEPB Inhale 2 puffs into the lungs every 6 (six) hours as needed. 1 each 11  . Fluticasone-Salmeterol (ADVAIR) 250-50 MCG/DOSE AEPB Inhale 1 puff into the lungs 2 (two) times daily.    Marland Kitchen loratadine (CLARITIN) 10 MG tablet Take 10 mg by mouth daily.    Marland Kitchen losartan-hydrochlorothiazide (HYZAAR) 50-12.5 MG per tablet Take 1 tablet by mouth daily.    . megestrol (MEGACE) 40 MG tablet Take 1 tablet (40 mg total) by mouth 2 (two) times daily. 60 tablet 6  . omeprazole (PRILOSEC OTC) 20 MG tablet Take 20 mg by mouth daily.     . Probiotic CAPS Take 1 capsule by mouth  daily.    . fluticasone (FLONASE) 50 MCG/ACT nasal spray Place 1 spray into both nostrils daily as needed for congestion.    Marland Kitchen ibuprofen (ADVIL,MOTRIN) 200 MG tablet Take 400 mg by mouth as needed for headache or moderate pain.     Marland Kitchen levothyroxine (SYNTHROID, LEVOTHROID) 75 MCG tablet Take 75 mcg by mouth daily before breakfast.    . loperamide (IMODIUM A-D) 2 MG tablet Take 2 mg by mouth 4 (four) times daily as needed for diarrhea or loose stools.    . predniSONE (DELTASONE) 10 MG tablet Take 1 tablet (10 mg total) by mouth daily with breakfast. 12 day taper (60 mg  x2 days, 50 mg x2 days, ...decrease by 10 mg every 2 days) (Patient not taking: Reported on 05/12/2017) 42 tablet 0   No current facility-administered medications for this visit.     Review of Systems General: negative for, fevers, chills, fatigue, changes in sleep, changes in weight or appetite Skin: negative for changes in color, texture, moles or lesions Eyes: negative for, changes in vision, pain, diplopia HEENT: negative for, change in hearing, pain, discharge, tinnitus, vertigo, voice changes, sore throat, neck masses Breasts: negative for breast lumps Pulmonary: negative for, dyspnea, orthopnea, productive cough Cardiac: negative for, palpitations, syncope, pain, discomfort, pressure Gastrointestinal: negative for, dysphagia, nausea, vomiting, jaundice, pain, constipation, diarrhea, hematemesis, hematochezia Genitourinary/Sexual: negative for, dysuria, discharge, hesitancy, nocturia, retention, stones, infections, STD's, incontinence Ob/Gyn: negative for, irregular bleeding, pain Musculoskeletal: negative for, pain, stiffness, swelling, range of motion limitation Hematology: negative for, easy bruising, bleeding Neurologic/Psych: negative for, headaches, seizures, paralysis, weakness, tremor, change in gait, change in sensation, mood swings, depression, anxiety, change in memory  Objective:  Physical Examination:  BP (!)  164/80 (BP Location: Left Arm, Patient Position: Sitting)   Pulse 92   Temp 98.1 F (36.7 C) (Tympanic)   Resp 20   Ht 5' (1.524 m)   Wt 152 lb 14.4 oz (69.4 kg)   BMI 29.86 kg/m    ECOG Performance Status: 0 - Asymptomatic  General appearance: alert, cooperative and appears stated age HEENT:extra ocular movement intact, neck supple with midline trachea and thyroid without masses Lymph node survey: non-palpable, axillary, inguinal, supraclavicular Cardiovascular: regular rate and rhythm, no murmurs or gallops Respiratory: normal air entry, lungs clear to auscultation and no rales, rhonchi or wheezing Breast exam: not examined. Abdomen: no hernias and well healed incisions Back: inspection of back is normal Extremities: extremities normal, atraumatic, no cyanosis or edema Skin exam - normal coloration and turgor, no rashes, no suspicious skin lesions noted. Neurological exam reveals alert, oriented, normal speech, no focal findings or movement disorder noted.  Pelvic: exam chaperoned by nurse;  Vulva: normal appearing vulva with no masses, tenderness or lesions; Vagina: normal vagina; Adnexa: normal.  RV; no masses    Assessment:  Anita Kent is a 77 y.o. 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  She had deep almost full thickness myometrial invasion and fallopian tube metastasis with spillage of cancer from uterine perforation. Now s/p pelvic radiation and no evidence of disease today.  On adjuvant Megace.  NED.  IHC shows no loss of MMR gene expression.  Plan:   Problem List Items Addressed This Visit      Other   Endometrial cancer, FIGO stage IIIA ([place]) - Primary     Will continue maintenance hormonal therapy with Megace 40 mg bid in view of ER/PR positive grade 1 cancer with deep invasion and no node sampling + adnexal involvement and uterine perforation.   Discussed potential side effects of DVT and weight gain.   The patient's diagnosis, an outline of the further diagnostic and laboratory studies which will be required, the recommendation, and alternatives were discussed.  All questions were answered to the patient's satisfaction.  She will RTC in 4 months for further follow up.  Mellody Drown, MD  CC:  Marinda Elk, MD [facility] [place], [place] [address] [phone]

## 2017-06-01 DIAGNOSIS — E119 Type 2 diabetes mellitus without complications: Secondary | ICD-10-CM | POA: Diagnosis not present

## 2017-06-01 DIAGNOSIS — I10 Essential (primary) hypertension: Secondary | ICD-10-CM | POA: Diagnosis not present

## 2017-06-01 DIAGNOSIS — E039 Hypothyroidism, unspecified: Secondary | ICD-10-CM | POA: Diagnosis not present

## 2017-06-01 DIAGNOSIS — E782 Mixed hyperlipidemia: Secondary | ICD-10-CM | POA: Diagnosis not present

## 2017-06-15 DIAGNOSIS — Z0000 Encounter for general adult medical examination without abnormal findings: Secondary | ICD-10-CM | POA: Diagnosis not present

## 2017-06-15 DIAGNOSIS — I10 Essential (primary) hypertension: Secondary | ICD-10-CM | POA: Diagnosis not present

## 2017-06-15 DIAGNOSIS — E119 Type 2 diabetes mellitus without complications: Secondary | ICD-10-CM | POA: Diagnosis not present

## 2017-06-15 DIAGNOSIS — Z1231 Encounter for screening mammogram for malignant neoplasm of breast: Secondary | ICD-10-CM | POA: Diagnosis not present

## 2017-06-15 DIAGNOSIS — Z23 Encounter for immunization: Secondary | ICD-10-CM | POA: Diagnosis not present

## 2017-06-15 DIAGNOSIS — C541 Malignant neoplasm of endometrium: Secondary | ICD-10-CM | POA: Diagnosis not present

## 2017-06-15 DIAGNOSIS — E782 Mixed hyperlipidemia: Secondary | ICD-10-CM | POA: Diagnosis not present

## 2017-06-15 DIAGNOSIS — E039 Hypothyroidism, unspecified: Secondary | ICD-10-CM | POA: Diagnosis not present

## 2017-06-15 DIAGNOSIS — M810 Age-related osteoporosis without current pathological fracture: Secondary | ICD-10-CM | POA: Diagnosis not present

## 2017-06-15 DIAGNOSIS — J4540 Moderate persistent asthma, uncomplicated: Secondary | ICD-10-CM | POA: Diagnosis not present

## 2017-06-29 DIAGNOSIS — M810 Age-related osteoporosis without current pathological fracture: Secondary | ICD-10-CM | POA: Diagnosis not present

## 2017-08-30 DIAGNOSIS — J441 Chronic obstructive pulmonary disease with (acute) exacerbation: Secondary | ICD-10-CM | POA: Diagnosis not present

## 2017-08-30 DIAGNOSIS — J45901 Unspecified asthma with (acute) exacerbation: Secondary | ICD-10-CM | POA: Diagnosis not present

## 2017-08-30 DIAGNOSIS — J439 Emphysema, unspecified: Secondary | ICD-10-CM | POA: Diagnosis not present

## 2017-09-08 ENCOUNTER — Ambulatory Visit: Payer: Medicare Other

## 2017-09-15 ENCOUNTER — Ambulatory Visit: Payer: Medicare Other

## 2017-10-06 ENCOUNTER — Encounter: Payer: Self-pay | Admitting: Obstetrics and Gynecology

## 2017-10-06 ENCOUNTER — Inpatient Hospital Stay: Payer: Medicare Other | Attending: Obstetrics and Gynecology | Admitting: Obstetrics and Gynecology

## 2017-10-06 VITALS — BP 131/76 | HR 80 | Temp 97.6°F | Resp 20 | Ht 60.0 in | Wt 158.2 lb

## 2017-10-06 DIAGNOSIS — C541 Malignant neoplasm of endometrium: Secondary | ICD-10-CM

## 2017-10-06 DIAGNOSIS — Z90710 Acquired absence of both cervix and uterus: Secondary | ICD-10-CM

## 2017-10-06 DIAGNOSIS — Z923 Personal history of irradiation: Secondary | ICD-10-CM | POA: Diagnosis not present

## 2017-10-06 DIAGNOSIS — Z90722 Acquired absence of ovaries, bilateral: Secondary | ICD-10-CM | POA: Insufficient documentation

## 2017-10-06 DIAGNOSIS — Z79818 Long term (current) use of other agents affecting estrogen receptors and estrogen levels: Secondary | ICD-10-CM

## 2017-10-06 NOTE — Progress Notes (Signed)
No gyn concerns 

## 2017-10-06 NOTE — Progress Notes (Signed)
Gynecologic Oncology Interval Visit   Referring Provider: Dr. Vikki Ports Ward  Chief Concern: Endometrial cancer  Subjective:  Anita Kent is a 77 y.o. 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 Leonides Schanz and Dr. Ouida Sills.    She returns to clinic today for surveillance. She was last seen in clinic by Dr. Fransisca Connors on 05/12/17. NED at that time. She is on maintenance hormonal therapy with Megace 40 mg bid in view of ER/PR positive grade 1 cancer with deep invasion and no node sampling + adnexal involvement and uterine perforation. She reports some weight gain since starting Megace. 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   She has followed with Dr. Fransisca Connors since starting Megace and does not see Dr. Leonides Schanz at this time. Her PCP does her mammogram screening.   Oncology History Menopause age 85, no hormone replacement. Postmenopausal bleeding episodes noted October 2017 at primary care visit and seen eventually by Dr Leonides Schanz. She had an ultrasound in May 2018 which showed many fibroids, and a mass in the endometrial cavity.  D&C with hysteroscopy was being set up, but Cardiac Clearance was delayed  D&C 09/04/16: Uterus, mobile, normal size with grade 2 descent, sounding to 10 cm; normal cervix, atrophic vagina without lesions, normal perineum. Difficult visualization due to active bleeding, no discreet mass able to be identified, but fluffy tissue with prominent blood vessels seen.  Moderate tissue on curettage.  Upon re-entry with scope, one small lobe of fatty tissue identified, at the same time the fluid deficit instantly and dramatically increased.  In view of this she had laparoscopy and upon entry to the abdomen, about 400cc of thin bloody fluid was encountered.  The uterus was observed to have a perforation in the  right cornua that was oozing, and would bleed with any manipulation.  From that defect was endometrial tissue protruding and falling into the abdomen.  Otherwise unremarkable pelvis and upper abdomen.  Bilateral tubes had been surgically altered, and bladder was thinly adherent to anterior LUS.  NO nodularity on peritoneal surfaces.  TLH/BSO performed in view of perforation and probable cancer.   Pathology 09/04/16 DIAGNOSIS:  A. ENDOMETRIUM; CURETTAGE:  - ENDOMETRIOID CARCINOMA, FIGO GRADE 1.   B. UTERUS WITH CERVIX; HYSTERECTOMY:  - ENDOMETRIOID CARCINOMA, FIGO GRADE 1, WITH DEEP MYOMETRIAL INVASION.  - NO DEFINITE CERVICAL INVOLVEMENT OR SEROSAL INVOLVEMENT.  - INTRAMURAL LEIOMYOMAS.   RIGHT FALLOPIAN TUBE; SALPINGECTOMY:  - INVOLVED BY ENDOMETRIOID CARCINOMA WITH MUSCLE INVASION.   LEFT FALLOPIAN TUBE; SALPINGECTOMY:  - NEGATIVE FOR MALIGNANCY.   RIGHT AND LEFT OVARIES; OOPHORECTOMY:  - NEGATIVE FOR MALIGNANCY.   Myometrial Invasion: Present    Depth of invasion: 15.5 mm    Myometrial thickness: 16 mm    Percentage of myometrial invasion; 97%    Distance from serosa at deepest invasion: 0.5 mm  Uterine Serosa Involvement: Not identified  Cervical Stromal Involvement: Not identified  Other Tissue/Organ Involvement: Right fallopian tube  Peritoneal/ Ascitic Fluid: Not submitted  Lymphovascular Invasion: Not identified  Regional Lymph Nodes: Not submitted  Pathologic Stage Classification (pTNM, AJCC 8th Edition): pT3a pNX/  FIGO IIIA    IHC Testing for Estrogen and Progesterone receptors:  Results: Estrogen receptor (ER) status: POSITIVE, >90% of cells with nuclear Positivity.  Progesterone receptor (PR) status: POSITIVE, >90% of cells with nuclear positivity  Average  intensity of staining, ER and PR: Strong     MLH1: Intact nuclear expression  MSH2: Intact nuclear expression  MSH6: Intact nuclear expression  PMS2: Intact nuclear expression    8/18 CT scan  was done to r/o metastatic disease. IMPRESSION: 1. Status post hysterectomy and bilateral salpingo oophorectomy. 2. Within the left iliac fossa there is a soft tissue attenuating structure with a short axis of 1.3 cm and is suspicious for adenopathy. Suggest further evaluation with PET-CT. 3. No evidence for distant metastatic disease. 4. Hiatal hernia 5. Aortic Atherosclerosis (ICD10-I70.0). Multi vessel coronary artery calcifications.  8/18 PET IMPRESSION: 1. The lesion of concern along the left pelvis is that the immediate termination of the ovarian vein and has only low-grade activity which would be unusual for active malignancy. The appearance suggests a small left ovarian remnant or dilated termination of the ovarian vein rather than adenopathy although likely merits surveillance by cross-sectional imaging. 2. Extensive paranasal sinusitis. 3. Photopenic hepatic cysts. 4. Other imaging findings of potential clinical significance: Moderate-sized hiatal hernia. Aortic Atherosclerosis (ICD10-I70.0). Coronary atherosclerosis. Cholelithiasis.  She was seen in consultation by [place] and decision was made to give adjuvant radiation and she received external pelvic radiation + vaginal brachytherapy.  Recommended maintenance hormonal therapy with Megace 40 mg bid in view of ER/PR positive grade 1 cancer with deep invasion and no node sampling + adnexal involvement and uterine perforation.      Problem List: Patient Active Problem List   Diagnosis Date Noted  . Endometrial cancer, FIGO stage IIIA ([place]) 09/16/2016  . Sinusitis, acute 08/13/2014  . Emphysema lung ([place]) 05/08/2014  . Asthma, chronic 03/06/2014  . Cough 03/06/2014   Past Medical History: Past Medical History:  Diagnosis Date  . Asthma   . COPD (chronic obstructive pulmonary disease) ([place])   . Cough variant asthma   . Diabetes ([place])    states was told she was not diabetic tested high once on hemoglobin A1C   . Dyslipidemia   . Emphysema of lung ([place])   . endometrial cancer 08/2016   Total Hysterectomy 09/04/2016  . HTN (hypertension)   . Hypertension   . Hypothyroidism   . Thyroid disease     Past Surgical History: Past Surgical History:  Procedure Laterality Date  . ABDOMINAL HYSTERECTOMY    . CATARACT EXTRACTION, BILATERAL    . CYSTOSCOPY N/A 09/04/2016   Procedure: CYSTOSCOPY;  Surgeon: Ward, Honor Loh, MD;  Location: [facility] ORS;  Service: Gynecology;  Laterality: N/A;  . DILATATION & CURETTAGE/HYSTEROSCOPY WITH MYOSURE N/A 09/04/2016   Procedure: DILATATION & CURETTAGE/HYSTEROSCOPY WITH MYOSURE;  Surgeon: Ward, Honor Loh, MD;  Location: [facility] ORS;  Service: Gynecology;  Laterality: N/A;  . GANGLION CYST EXCISION Left    hand  . LAPAROSCOPIC BILATERAL SALPINGO OOPHERECTOMY Bilateral 09/04/2016   Procedure: LAPAROSCOPIC BILATERAL SALPINGO OOPHORECTOMY;  Surgeon: Ward, Honor Loh, MD;  Location: [facility] ORS;  Service: Gynecology;  Laterality: Bilateral;  . LAPAROSCOPIC HYSTERECTOMY  09/04/2016   Procedure: HYSTERECTOMY TOTAL LAPAROSCOPIC;  Surgeon: Ward, Honor Loh, MD;  Location: [facility] ORS;  Service: Gynecology;;  . primary open reduction procedure Right 11/09/2013    right ankle  . TONSILLECTOMY    . TUBAL LIGATION        Family History: Family History  Problem Relation Age of Onset  . Myasthenia gravis Daughter   . Emphysema Mother   . COPD Mother   . Dementia Mother   . Stroke Father   . Heart attack Sister  Social History: Social History   Socioeconomic History  . Marital status: Married    Spouse name: Not on file  . Number of children: Not on file  . Years of education: Not on file  . Highest education level: Not on file  Occupational History  . Not on file  Social Needs  . Financial resource strain: Not on file  . Food insecurity:    Worry: Not on file    Inability: Not on file  . Transportation needs:    Medical: Not on file    Non-medical: Not on file   Tobacco Use  . Smoking status: Never Smoker  . Smokeless tobacco: Never Used  Substance and Sexual Activity  . Alcohol use: No    Alcohol/week: 0.0 standard drinks  . Drug use: No  . Sexual activity: Never  Lifestyle  . Physical activity:    Days per week: Not on file    Minutes per session: Not on file  . Stress: Not on file  Relationships  . Social connections:    Talks on phone: Not on file    Gets together: Not on file    Attends religious service: Not on file    Active member of club or organization: Not on file    Attends meetings of clubs or organizations: Not on file    Relationship status: Not on file  . Intimate partner violence:    Fear of current or ex partner: Not on file    Emotionally abused: Not on file    Physically abused: Not on file    Forced sexual activity: Not on file  Other Topics Concern  . Not on file  Social History Narrative  . Not on file    Allergies: Allergies  Allergen Reactions  . Oxycodone Itching  . Sulfa Antibiotics Hives  . Amoxicillin Hives and Rash    Has patient had a PCN reaction causing immediate rash, facial/tongue/throat swelling, SOB or lightheadedness with hypotension: Yes Has patient had a PCN reaction causing severe rash involving mucus membranes or skin necrosis: Yes Has patient had a PCN reaction that required hospitalization: No Has patient had a PCN reaction occurring within the last 10 years: Yes If all of the above answers are "NO", then may proceed with Cephalosporin use.   Diona Fanti [Aspirin] Hives  . Levaquin [Levofloxacin In D5w] Hives    Current Medications: Current Outpatient Medications  Medication Sig Dispense Refill  . albuterol (PROVENTIL) (2.5 MG/3ML) 0.083% nebulizer solution Take 2.5 mg by nebulization every 6 (six) hours as needed for wheezing or shortness of breath.    . Albuterol Sulfate (PROAIR RESPICLICK) 342 (90 BASE) MCG/ACT AEPB Inhale 2 puffs into the lungs every 6 (six) hours as needed. 1  each 11  . Fluticasone-Salmeterol (ADVAIR) 250-50 MCG/DOSE AEPB Inhale 1 puff into the lungs 2 (two) times daily.    Marland Kitchen ibuprofen (ADVIL,MOTRIN) 200 MG tablet Take 400 mg by mouth as needed for headache or moderate pain.     Mar[place] Kitchen levothyroxine (SYNTHROID, LEVOTHROID) 75 MCG tablet Take 75 mcg by mouth daily before breakfast.    . loratadine (CLARITIN) 10 MG tablet Take 10 mg by mouth daily.    Marland Kitchen losartan-hydrochlorothiazide (HYZAAR) 50-12.5 MG per tablet Take 1 tablet by mouth daily.    . megestrol (MEGACE) 40 MG tablet Take 1 tablet (40 mg total) by mouth 2 (two) times daily. 60 tablet 6  . omeprazole (PRILOSEC OTC) 20 MG tablet Take 20 mg by mouth daily.     Marland Kitchen  Probiotic CAPS Take 1 capsule by mouth daily.    . fluticasone (FLONASE) 50 MCG/ACT nasal spray Place 1 spray into both nostrils daily as needed for congestion.    Marland Kitchen loperamide (IMODIUM A-D) 2 MG tablet Take 2 mg by mouth 4 (four) times daily as needed for diarrhea or loose stools.    . predniSONE (DELTASONE) 10 MG tablet Take 1 tablet (10 mg total) by mouth daily with breakfast. 12 day taper (60 mg x2 days, 50 mg x2 days, ...decrease by 10 mg every 2 days) (Patient not taking: Reported on 05/12/2017) 42 tablet 0   No current facility-administered medications for this visit.     Review of Systems General: weight gain; no changes to appetite Skin: no complaints Eyes: no complaints HEENT: no complaints Breasts: no complaints Pulmonary: sob (chronic; no worse; hx of copd/emphysema) Cardiac: no complaints Gastrointestinal: no complaints Genitourinary/Sexual: no complaints Ob/Gyn: no complaints Musculoskeletal: no complaints Hematology: no complaints Neurologic/Psych: no complaints    Objective:  Physical Examination:  BP 131/76   Pulse 80   Temp 97.6 F (36.4 C) (Tympanic)   Resp 20   Ht 5' (1.524 m)   Wt 158 lb 3.2 oz (71.8 kg)   BMI 30.90 kg/m    ECOG Performance Status: 0 - Asymptomatic   GENERAL: Patient is a well  appearing female in no acute distress HEENT:  Sclerae anicteric.  Oropharynx clear and moist. Neck is supple.  NODES:  No cervical, supraclavicular, axillary, inguinal lymphadenopathy palpated.  LUNGS:  Clear to auscultation bilaterally.  No wheezes or rhonchi. HEART:  Regular rate and rhythm. No murmur appreciated. ABDOMEN:  Soft, nontender. No organomegaly palpated. Well healed surgical scars. No hernias EXTREMITIES:  No peripheral edema.   SKIN:  Clear with no obvious rashes or skin changes. No nail dyscrasia. NEURO:  Nonfocal. Well oriented.  Appropriate affect.  Pelvic Exam: chaperoned by NP: Vulva: normal appearing without masses, tenderness or lesions. Vagina: atrophic, shortened vaginal length. No bleeding or discharge. Adnexa: normal. RV: deferred.      Assessment:  Anita Kent is a 77 y.o. 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  She had deep almost full thickness myometrial invasion and fallopian tube metastasis with spillage of cancer from uterine perforation. Now s/p pelvic radiation and no evidence of disease today. On adjuvant Megace. No evidence of disease today.   IHC shows no loss of MMR gene expression.  Plan:   Problem List Items Addressed This Visit      Other   Endometrial cancer, FIGO stage IIIA ([place]) - Primary     Continue maintenance hormonal therapy with Megace 40 mg BID in view of ER/PR positive grade 1 endometrial cancer with deep invasion, no node sampling, and positive adnexal involvement, and uterine perforation. Tolerating well. No DVT. Weight gain of 6 lbs.   Recommended continuing Megace for 2 years of therapy. We will have her return to clinic in 6 months for surveillance to see Dr. Fransisca Connors. Once she completed 2 years therapy consider alternating visits with Dr. Leonides Schanz at that time.   All questions were answered to the patient's  satisfaction.    Beckey Rutter, DNP, AGNP-C [place] [facility] [phone] (work cell) [phone] (office)  I personally had a face to face interaction and evaluated the patient jointly with the NP, Ms. Beckey Rutter.  I have reviewed her history and available records and have  performed the key portions of the physical exam including abdominal exam, and pelvic exam with my findings confirming those documented above by the APP.  I have discussed the case with the APP and the patient.  I agree with the above documentation, assessment and plan which was fully formulated by me.  Counseling was completed by me.   I personally saw the patient and performed a substantive portion of this encounter in conjunction with the listed APP as documented above.  A total of 25 minutes were spent with the patient/family today; >50% was spent in education, counseling and coordination of care for endometrial cancer.  Peony Barner Gaetana Michaelis, MD   CC:   Dr. Vikki Ports Ward

## 2017-10-13 ENCOUNTER — Ambulatory Visit
Admission: EM | Admit: 2017-10-13 | Discharge: 2017-10-13 | Disposition: A | Discharge status: Home / Self Care | Payer: Medicare Other | Attending: Emergency Medicine | Admitting: Emergency Medicine

## 2017-10-13 ENCOUNTER — Encounter: Payer: Self-pay | Admitting: Gynecology

## 2017-10-13 DIAGNOSIS — J441 Chronic obstructive pulmonary disease with (acute) exacerbation: Secondary | ICD-10-CM | POA: Diagnosis not present

## 2017-10-13 MED ORDER — PREDNISONE 10 MG PO TABS: ORAL_TABLET | ORAL | 0 refills | Status: DC

## 2017-10-13 MED ORDER — ALBUTEROL SULFATE (2.5 MG/3ML) 0.083% IN NEBU: 2.5000 mg | INHALATION_SOLUTION | Freq: Four times a day (QID) | RESPIRATORY_TRACT | 0 refills | Status: DC | PRN

## 2017-10-13 MED ORDER — IPRATROPIUM-ALBUTEROL 0.5-2.5 (3) MG/3ML IN SOLN
3.0000 mL | Freq: Once | RESPIRATORY_TRACT | Status: AC
  Administered 2017-10-13: 3 mL via RESPIRATORY_TRACT

## 2017-10-13 MED ORDER — DOXYCYCLINE HYCLATE 100 MG PO CAPS: 100.0000 mg | ORAL_CAPSULE | Freq: Two times a day (BID) | ORAL | 0 refills | Status: DC

## 2017-10-13 NOTE — ED Triage Notes (Signed)
Per patient c/o history or COP, Asthma and Emphysema. Per patient out of her nebulizer and has not taken her Advair medication as yet. Patient stated also with sinus congestion. Per patient when doing anything to exert herself she is short of breath.

## 2017-10-13 NOTE — ED Provider Notes (Signed)
[facility] ____________________________________________  Time seen: Approximately 8:35 AM  I have reviewed the triage vital signs and the nursing notes.   HISTORY  Chief Complaint No chief complaint on file.  HPI  Anita Kent is a 77 y.o. female past medical history of asthma, COPD, hypertension presenting for 1 week of nasal congestion, sinus congestion, cough and increased shortness of breath.  Patient reports that she has been having increased shortness of breath with her COPD for the last 8 months, but reports in the last week it has increased more so.  Denies any associated chest pain, syncope or near syncope.  States that she still has her albuterol inhaler but does not have her albuterol solutions anymore, she ran out.  States that her cough is been productive of some whitish phlegm, but also having a lot of postnasal drainage.  No fevers.  Denies known sick contacts.  Has continue to remain active.  Denies other aggravating or alleviating factors.  No other medicine taken for the same complaints.  Reports otherwise feels well.  Denies chest pain, abdominal pain, dysuria, extremity pain, extremity swelling or rash. Denies recent sickness. Denies recent antibiotic use.   Marinda Elk, MD: PCP   Past Medical History:  Diagnosis Date  . Asthma   . COPD (chronic obstructive pulmonary disease) ([place])   . Cough variant asthma   . Diabetes ([place])    states was told she was not diabetic tested high once on hemoglobin A1C  . Dyslipidemia   . Emphysema of lung ([place])   . endometrial cancer 08/2016   Total Hysterectomy 09/04/2016  . HTN (hypertension)   . Hypertension   . Hypothyroidism   . Thyroid disease     Patient Active Problem List   Diagnosis Date Noted  . Endometrial cancer, FIGO stage IIIA ([place]) 09/16/2016  . Sinusitis, acute 08/13/2014  . Emphysema lung ([place]) 05/08/2014  . Asthma, chronic 03/06/2014  . Cough 03/06/2014    Past Surgical  History:  Procedure Laterality Date  . ABDOMINAL HYSTERECTOMY    . CATARACT EXTRACTION, BILATERAL    . CYSTOSCOPY N/A 09/04/2016   Procedure: CYSTOSCOPY;  Surgeon: Ward, Honor Loh, MD;  Location: [facility] ORS;  Service: Gynecology;  Laterality: N/A;  . DILATATION & CURETTAGE/HYSTEROSCOPY WITH MYOSURE N/A 09/04/2016   Procedure: DILATATION & CURETTAGE/HYSTEROSCOPY WITH MYOSURE;  Surgeon: Ward, Honor Loh, MD;  Location: [facility];  Service: Gynecology;  Laterality: N/A;  . GANGLION CYST EXCISION Left    hand  . LAPAROSCOPIC BILATERAL SALPINGO OOPHERECTOMY Bilateral 09/04/2016   Procedure: LAPAROSCOPIC BILATERAL SALPINGO OOPHORECTOMY;  Surgeon: Ward, Honor Loh, MD;  Location: [facility] ORS;  Service: Gynecology;  Laterality: Bilateral;  . LAPAROSCOPIC HYSTERECTOMY  09/04/2016   Procedure: HYSTERECTOMY TOTAL LAPAROSCOPIC;  Surgeon: Ward, Honor Loh, MD;  Location: [facility] ORS;  Service: Gynecology;;  . primary open reduction procedure Right 11/09/2013    right ankle  . TONSILLECTOMY    . TUBAL LIGATION       No current facility-administered medications for this encounter.   Current Outpatient Medications:  .  albuterol (PROVENTIL) (2.5 MG/3ML) 0.083% nebulizer solution, Take 2.5 mg by nebulization every 6 (six) hours as needed for wheezing or shortness of breath., Disp: , Rfl:  .  Albuterol Sulfate (PROAIR RESPICLICK) 782 (90 BASE) MCG/ACT AEPB, Inhale 2 puffs into the lungs every 6 (six) hours as needed., Disp: 1 each, Rfl: 11 .  Fluticasone-Salmeterol (ADVAIR) 250-50 MCG/DOSE AEPB, Inhale 1 puff into the lungs 2 (two) times daily.,  Disp: , Rfl:  .  ibuprofen (ADVIL,MOTRIN) 200 MG tablet, Take 400 mg by mouth as needed for headache or moderate pain. , Disp: , Rfl:  .  levothyroxine (SYNTHROID, LEVOTHROID) 75 MCG tablet, Take 75 mcg by mouth daily before breakfast., Disp: , Rfl:  .  loratadine (CLARITIN) 10 MG tablet, Take 10 mg by mouth daily., Disp: , Rfl:  .  losartan-hydrochlorothiazide (HYZAAR) 50-12.5  MG per tablet, Take 1 tablet by mouth daily., Disp: , Rfl:  .  megestrol (MEGACE) 40 MG tablet, Take 1 tablet (40 mg total) by mouth 2 (two) times daily., Disp: 60 tablet, Rfl: 6 .  omeprazole (PRILOSEC OTC) 20 MG tablet, Take 20 mg by mouth daily. , Disp: , Rfl:  .  predniSONE (DELTASONE) 10 MG tablet, Take 1 tablet (10 mg total) by mouth daily with breakfast. 12 day taper (60 mg x2 days, 50 mg x2 days, ...decrease by 10 mg every 2 days), Disp: 42 tablet, Rfl: 0 .  Probiotic CAPS, Take 1 capsule by mouth daily., Disp: , Rfl:  .  albuterol (PROVENTIL) (2.5 MG/3ML) 0.083% nebulizer solution, Take 3 mLs (2.5 mg total) by nebulization every 6 (six) hours as needed for wheezing or shortness of breath., Disp: 75 mL, Rfl: 0 .  doxycycline (VIBRAMYCIN) 100 MG capsule, Take 1 capsule (100 mg total) by mouth 2 (two) times daily., Disp: 20 capsule, Rfl: 0 .  predniSONE (DELTASONE) 10 MG tablet, Start 60 mg po x 2 days, decrease by 10mg  qod til gone. (6,6,5,5,4,4,3,3,2,2,1,1), Disp: 42 tablet, Rfl: 0  Allergies Oxycodone; Sulfa antibiotics; Amoxicillin; Asa [aspirin]; and Levaquin [levofloxacin in d5w]  Family History  Problem Relation Age of Onset  . Myasthenia gravis Daughter   . Emphysema Mother   . COPD Mother   . Dementia Mother   . Stroke Father   . Heart attack Sister     Social History Social History   Tobacco Use  . Smoking status: Never Smoker  . Smokeless tobacco: Never Used  Substance Use Topics  . Alcohol use: No    Alcohol/week: 0.0 standard drinks  . Drug use: No    Review of Systems Constitutional: No fever/chills ENT: Some sore throat.  Positive nasal congestion. Cardiovascular: Denies chest pain. Respiratory: Denies shortness of breath. Gastrointestinal: No abdominal pain.  Musculoskeletal: Negative for back pain. Skin: Negative for rash.   ____________________________________________   PHYSICAL EXAM:  VITAL SIGNS: ED Triage Vitals  Enc Vitals Group     BP  10/13/17 0816 119/63     Pulse Rate 10/13/17 0816 93     Resp 10/13/17 0816 18     Temp 10/13/17 0816 97.9 F (36.6 C)     Temp Source 10/13/17 0816 Oral     SpO2 10/13/17 0816 96 %     Weight 10/13/17 0817 155 lb (70.3 kg)     Height --      Head Circumference --      Peak Flow --      Pain Score 10/13/17 0817 0     Pain Loc --      Pain Edu? --      Excl. in [place]? --     Constitutional: Alert and oriented. Well appearing and in no acute distress. Eyes: Conjunctivae are normal. PERRL. EOMI. Head: Atraumatic. No tenderness to palpation bilateral frontal and maxillary sinuses. No swelling. No erythema.   Ears: no erythema, normal TMs bilaterally.   Nose: nasal congestion with bilateral nasal turbinate erythema and edema.  Mouth/Throat: Mucous membranes are moist.  Oropharynx non-erythematous.No tonsillar swelling or exudate.  Neck: No stridor.  No cervical spine tenderness to palpation. Hematological/Lymphatic/Immunilogical: No cervical lymphadenopathy. Cardiovascular: Normal rate, regular rhythm. Grossly normal heart sounds.  Good peripheral circulation. Respiratory: Normal respiratory effort.  No retractions.  Scattered inspiratory and expiratory wheezes.  No rhonchi.  Good air movement.  Dry intermittent cough noted in room. Musculoskeletal: Steady gait.  No lower extremity edema noted. Neurologic:  Normal speech and language. No gait instability. Skin:  Skin is warm, dry and intact. No rash noted. Psychiatric: Mood and affect are normal. Speech and behavior are normal.  ___________________________________________   LABS (all labs ordered are listed, but only abnormal results are displayed)  Labs Reviewed - No data to display  PROCEDURES Procedures   INITIAL IMPRESSION / ASSESSMENT AND PLAN / ED COURSE  Pertinent labs & imaging results that were available during my care of the patient were reviewed by me and considered in my medical decision making (see chart for  details).  Well-appearing patient.  No acute distress.  Suspect recent viral illness with COPD exacerbation.  Will treat with prednisone taper, doxycycline and refill albuterol neb treatment.  Encourage close PCP follow-up this coming week.  Also as patient reports gradual increase of shortness of breath with her COPD, recommend follow back up with pulmonology.  Encourage rest, fluids, supportive care.  DuoNeb given in urgent care with patient reports feeling better afterwards is improved.Discussed indication, risks and benefits of medications with patient.  Discussed follow up with Primary care physician this week. Discussed follow up and return parameters including no resolution or any worsening concerns. Patient verbalized understanding and agreed to plan.   ____________________________________________   FINAL CLINICAL IMPRESSION(S) / ED DIAGNOSES  Final diagnoses:  COPD exacerbation [facility])     ED Discharge Orders         Ordered    predniSONE (DELTASONE) 10 MG tablet     10/13/17 0848    doxycycline (VIBRAMYCIN) 100 MG capsule  2 times daily     10/13/17 0848    albuterol (PROVENTIL) (2.5 MG/3ML) 0.083% nebulizer solution  Every 6 hours PRN     10/13/17 0848           Note: This dictation was prepared with Dragon dictation along with smaller phrase technology. Any transcriptional errors that result from this process are unintentional.         Marylene [place], NP 10/13/17 [phone]

## 2017-10-13 NOTE — Discharge Instructions (Addendum)
Take medication as prescribed. Rest. Drink plenty of fluids.   Follow up with your primary care physician. Return to Urgent care for new or worsening concerns.

## 2017-11-01 ENCOUNTER — Ambulatory Visit: Payer: Medicare Other | Admitting: Pulmonary Disease

## 2017-11-01 ENCOUNTER — Ambulatory Visit (INDEPENDENT_AMBULATORY_CARE_PROVIDER_SITE_OTHER): Payer: Medicare Other | Admitting: Pulmonary Disease

## 2017-11-01 ENCOUNTER — Encounter: Payer: Self-pay | Admitting: Pulmonary Disease

## 2017-11-01 VITALS — BP 142/70 | HR 97 | Ht 60.0 in | Wt 157.0 lb

## 2017-11-01 DIAGNOSIS — R0600 Dyspnea, unspecified: Secondary | ICD-10-CM

## 2017-11-01 DIAGNOSIS — R059 Cough, unspecified: Secondary | ICD-10-CM

## 2017-11-01 DIAGNOSIS — J4540 Moderate persistent asthma, uncomplicated: Secondary | ICD-10-CM | POA: Diagnosis not present

## 2017-11-01 DIAGNOSIS — R0609 Other forms of dyspnea: Secondary | ICD-10-CM | POA: Diagnosis not present

## 2017-11-01 DIAGNOSIS — R05 Cough: Secondary | ICD-10-CM | POA: Diagnosis not present

## 2017-11-01 MED ORDER — OMEPRAZOLE MAGNESIUM 20 MG PO TBEC: 40.0000 mg | DELAYED_RELEASE_TABLET | Freq: Every day | ORAL | 10 refills | Status: AC

## 2017-11-01 MED ORDER — FLUTICASONE-SALMETEROL 250-50 MCG/DOSE IN AEPB: 1.0000 | INHALATION_SPRAY | Freq: Two times a day (BID) | RESPIRATORY_TRACT | 10 refills | Status: DC

## 2017-11-01 NOTE — Patient Instructions (Signed)
Change Advair back to 250-50, 1 inhalation twice a day.  Rinse mouth after use.  Prescription has been refilled  Change omeprazole (Prilosec) to 40 mg daily taken between dinner and bedtime.  Prescription has been entered  Chest x-ray and lung function tests ordered  Follow-up in 3 to 4 weeks.  Call sooner if needed

## 2017-11-02 ENCOUNTER — Encounter: Payer: Self-pay | Admitting: Pulmonary Disease

## 2017-11-02 NOTE — Progress Notes (Signed)
PULMONARY OFFICE FOLLOW UP  PT PROFILE: 77 y.o. F never smoker with history of mild persistent asthma, previously patient of Dr. Stevenson Clinch  DATA: 05/08/14 PFTs: FVC: 2.07 > 2.12 L (88 > 90 %pred), FEV1: 1.43 > 1.60 L (82 > 92 %pred), FEV1/FVC: 69%, TLC: 4.34 L (97 %pred), DLCO 67 %pred, DLCO/VA 81% predicted 09/24/16 CT chest: Peripheral wedge-shaped area of subsegmental atelectasis noted within the anterior left upper lobe. Scar versus subsegmental atelectasis noted within the posteromedial right lung base and right middle lobe  INTERVAL: Last seen by Dr. Stevenson Clinch 07/03/2015.  SUBJ: She returns today to reestablish care with the pulmonary division.  She reports shortness of breath "all the time".  She has been prescribed prednisone 3 times since June.  She reports wheezing and class II-III dyspnea.  She has scant cough with minimal mucus production.  She uses her albuterol occasionally which she believes improves her dyspnea.  Recently, Advair dose was increased to 500/50 strength.  She denies CP, fever, purulent sputum, hemoptysis, LE edema and calf tenderness   OBJ: Vitals:   11/01/17 0950 11/01/17 0955  BP:  (!) 142/70  Pulse:  97  SpO2:  98%  Weight: 157 lb (71.2 kg)   Height: 5' (1.524 m)     Gen: NAD at rest HEENT: NCAT, sclera white Neck: No JVD Lungs: Full BS, wheezes isolated to LLL posteriorly with partial improvement after cough Cardiovascular: RRR, no murmurs Abdomen: Soft, nontender, normal BS Ext: without clubbing, cyanosis, edema Neuro: grossly intact Skin: Limited exam, no lesions noted    IMPRESSION: Moderate persistent asthma without complication - Plan: Pulmonary Function Test [facility] Only, DG Chest 2 View  Cough - Plan: Pulmonary Function Test [facility] Only, DG Chest 2 View  DOE (dyspnea on exertion) - Plan: Pulmonary Function Test [facility] Only, DG Chest 2 View   I am concerned about her being on high-dose inhaled steroids (with little evidence that high-dose provides  benefit over standard dose)  She has a history of GERD which might be a cause or contributor to her cough  She is presently taking omeprazole in the morning   PLAN:   Change Advair back to 250-50, 1 inhalation twice a day.  Rinse mouth after use.  Prescription has been refilled  Change omeprazole (Prilosec) to 40 mg daily taken between dinner and bedtime.  Prescription has been entered  Chest x-ray and lung function tests ordered  Follow-up in 3 to 4 weeks.  Call sooner if needed   Merton Border, MD PCCM service Mobile [phone] Pager [phone] 11/02/2017 11:27 AM

## 2017-11-04 ENCOUNTER — Ambulatory Visit: Payer: Medicare Other | Admitting: Pulmonary Disease

## 2017-11-18 ENCOUNTER — Ambulatory Visit (HOSPITAL_COMMUNITY): Payer: Medicare Other

## 2017-11-18 ENCOUNTER — Ambulatory Visit
Admission: RE | Admit: 2017-11-18 | Discharge: 2017-11-18 | Disposition: A | Discharge status: Home / Self Care | Payer: Medicare Other | Source: Ambulatory Visit | Attending: Pulmonary Disease | Admitting: Pulmonary Disease

## 2017-11-18 DIAGNOSIS — R0600 Dyspnea, unspecified: Secondary | ICD-10-CM

## 2017-11-18 DIAGNOSIS — R059 Cough, unspecified: Secondary | ICD-10-CM

## 2017-11-18 DIAGNOSIS — R05 Cough: Secondary | ICD-10-CM | POA: Diagnosis not present

## 2017-11-18 DIAGNOSIS — R0609 Other forms of dyspnea: Secondary | ICD-10-CM | POA: Diagnosis not present

## 2017-11-18 DIAGNOSIS — R0602 Shortness of breath: Secondary | ICD-10-CM | POA: Diagnosis not present

## 2017-11-18 DIAGNOSIS — J984 Other disorders of lung: Secondary | ICD-10-CM | POA: Insufficient documentation

## 2017-11-18 DIAGNOSIS — J4540 Moderate persistent asthma, uncomplicated: Secondary | ICD-10-CM

## 2017-11-18 MED ORDER — ALBUTEROL SULFATE (2.5 MG/3ML) 0.083% IN NEBU
2.5000 mg | INHALATION_SOLUTION | Freq: Once | RESPIRATORY_TRACT | Status: AC
  Administered 2017-11-18: 2.5 mg via RESPIRATORY_TRACT
  Filled 2017-11-18: qty 3

## 2017-11-23 ENCOUNTER — Ambulatory Visit (INDEPENDENT_AMBULATORY_CARE_PROVIDER_SITE_OTHER): Payer: Medicare Other | Admitting: Pulmonary Disease

## 2017-11-23 ENCOUNTER — Encounter: Payer: Self-pay | Admitting: Pulmonary Disease

## 2017-11-23 ENCOUNTER — Other Ambulatory Visit
Admission: RE | Admit: 2017-11-23 | Discharge: 2017-11-23 | Disposition: A | Discharge status: Home / Self Care | Payer: Medicare Other | Source: Ambulatory Visit | Attending: Pulmonary Disease | Admitting: Pulmonary Disease

## 2017-11-23 VITALS — BP 140/78 | HR 89 | Ht 59.0 in | Wt 145.8 lb

## 2017-11-23 DIAGNOSIS — D721 Eosinophilia, unspecified: Secondary | ICD-10-CM

## 2017-11-23 DIAGNOSIS — K219 Gastro-esophageal reflux disease without esophagitis: Secondary | ICD-10-CM | POA: Diagnosis not present

## 2017-11-23 DIAGNOSIS — J4550 Severe persistent asthma, uncomplicated: Secondary | ICD-10-CM | POA: Diagnosis not present

## 2017-11-23 LAB — CBC WITH DIFFERENTIAL/PLATELET
Abs Immature Granulocytes: 0.07 10*3/uL (ref 0.00–0.07)
Basophils Absolute: 0.1 10*3/uL (ref 0.0–0.1)
Basophils Relative: 1 %
Eosinophils Absolute: 2.8 10*3/uL — ABNORMAL HIGH (ref 0.0–0.5)
Eosinophils Relative: 32 %
HCT: 36.2 % (ref 36.0–46.0)
Hemoglobin: 11.3 g/dL — ABNORMAL LOW (ref 12.0–15.0)
Immature Granulocytes: 1 %
Lymphocytes Relative: 7 %
Lymphs Abs: 0.7 10*3/uL (ref 0.7–4.0)
MCH: 27.9 pg (ref 26.0–34.0)
MCHC: 31.2 g/dL (ref 30.0–36.0)
MCV: 89.4 fL (ref 80.0–100.0)
Monocytes Absolute: 0.6 10*3/uL (ref 0.1–1.0)
Monocytes Relative: 7 %
Neutro Abs: 4.7 10*3/uL (ref 1.7–7.7)
Neutrophils Relative %: 52 %
Platelets: 374 10*3/uL (ref 150–400)
RBC: 4.05 MIL/uL (ref 3.87–5.11)
RDW: 15.1 % (ref 11.5–15.5)
WBC: 8.8 10*3/uL (ref 4.0–10.5)
nRBC: 0 % (ref 0.0–0.2)

## 2017-11-23 MED ORDER — MONTELUKAST SODIUM 10 MG PO TABS: 10.0000 mg | ORAL_TABLET | Freq: Every day | ORAL | 5 refills | Status: DC

## 2017-11-23 MED ORDER — ALBUTEROL SULFATE 108 (90 BASE) MCG/ACT IN AEPB: 2.0000 | INHALATION_SPRAY | Freq: Four times a day (QID) | RESPIRATORY_TRACT | 11 refills | Status: DC | PRN

## 2017-11-23 NOTE — Progress Notes (Signed)
PULMONARY OFFICE FOLLOW UP  PT PROFILE: 77 y.o. F never smoker with history of asthma, previously patient of Dr. Stevenson Clinch  DATA: 05/08/14 PFTs: FVC: 2.07 > 2.12 L (88 > 90 %pred), FEV1: 1.43 > 1.60 L (82 > 92 %pred), FEV1/FVC: 69%, TLC: 4.34 L (97 %pred), DLCO 67 %pred, DLCO/VA 81% predicted 09/24/16 CT chest: Peripheral wedge-shaped area of subsegmental atelectasis noted within the anterior left upper lobe. Scar versus subsegmental atelectasis noted within the posteromedial right lung base and right middle lobe 11/18/17 PFTs: FVC: 1.56 > 1.61 L (69 > 71 %pred), FEV1: 1.03 > 1.00 L (59 > 57 %pred), FEV1/FVC: 66%, TLC: 4.90 L (109 %pred), DLCO measurement invalid    INTERVAL:  Last seen 11/01/2017.  No major pulmonary events  SUBJ: This is a scheduled follow-up.  She is here to review pulmonary function tests which are documented above.  She has no new complaints.  She is using Advair compliantly.  Nonetheless, she is requiring nebulized albuterol approximately 2 times per day.  She rarely uses her rescue inhaler.  She has no new complaints.  She denies CP, fever, purulent sputum, hemoptysis, LE edema and calf tenderness.  OBJ: Vitals:   11/23/17 1025  BP: 140/78  Pulse: 89  SpO2: 100%  Weight: 145 lb 12.8 oz (66.1 kg)  Height: 4\' 11"  (1.499 m)  Room air  Gen: NAD HEENT: NCAT, sclerae white Neck: No JVD, no lymphadenopathy Lungs: Breath sounds full, scattered low pitched expiratory wheezes noted Cardiovascular: Regular, no M Abdomen: Soft, NT, NABS Ext: No C/C/E Neuro: No focal deficits Skin: Limited exam, no lesions noted   BMP Latest Ref Rng & Units 09/16/2016 07/21/2016 06/23/2016  Glucose 65 - 99 mg/dL - 134(H) 77  BUN 6 - 20 mg/dL - 32(H) 21(H)  Creatinine 0.44 - 1.00 mg/dL 1.34(H) 1.20(H) 1.21(H)  BUN/Creat Ratio 11 - 26 - - -  Sodium 135 - 145 mmol/L - 134(L) 138  Potassium 3.5 - 5.1 mmol/L - 3.9 3.9  Chloride 101 - 111 mmol/L - 103 104  CO2 22 - 32 mmol/L - 23 26  Calcium  8.9 - 10.3 mg/dL - 9.3 9.4   CBC Latest Ref Rng & Units 11/23/2017 11/12/2016 11/05/2016  WBC 4.0 - 10.5 K/uL 8.8 5.1 4.6  Hemoglobin 12.0 - 15.0 g/dL 11.3(L) 11.2(L) 10.7(L)  Hematocrit 36.0 - 46.0 % 36.2 32.9(L) 31.7(L)  Platelets 150 - 400 K/uL 374 317 282   CXR 11/18/2017: No acute findings   IMPRESSION: Severe persistent asthma without complication - Plan: CBC with Differential/Platelet, CANCELED: CBC with Differential/Platelet  GERD  Prior eosinophilia - Plan: CBC with Differential/Platelet, CANCELED: CBC with Differential/Platelet   Her asthma control remains suboptimal.  Her PFTs have worsened since previously measured in 2016.  Review of prior blood testing reveals that she did have eosinophilia.  If this persists, she might be a candidate for biological therapy such as Nucala   PLAN:   Continue Advair inhaler twice daily.  She is instructed to rinse mouth after use To new albuterol (nebulized or MDI) as needed  New prescription: Montelukast 10 mg daily to be taken at bedtime Repeat CBC with WBC differential ordered for today  If eosinophilia persists, we will pursue Nucala or Fasenra Follow-up in 2 to 3 months.  Call sooner if needed   Merton Border, MD PCCM service Mobile [phone] Pager [phone] 11/25/2017 11:27 AM

## 2017-11-23 NOTE — Patient Instructions (Addendum)
Continue Advair inhaler twice a day.  Rinse mouth after use  Continue albuterol inhaler as needed for increased shortness of breath, wheezing, chest tightness, cough.  I encouraged more liberal use of this medication  New prescription: Singulair (montelukast) 10 mg daily taken at bedtime.  If you cannot tell any benefit from this medication after a trial of at least 1 or 2 weeks, you may stop it.  Blood test today: CBC with differential to look for eosinophil count  Based on the results of the blood test, we might refer you to [facility] for "biological therapy".  This would be therapy directed at reducing the eosinophils in your blood and airways  Follow-up in 2 to 3 months.  Call sooner if needed.

## 2017-11-24 ENCOUNTER — Telehealth: Payer: Self-pay

## 2017-11-24 NOTE — Telephone Encounter (Signed)
Spoke to patient re: Lab results. Per Dr. Alva Garnet, he would like to start her on Nucala. Patient aware to stop by the office to sign paperwork to begin Nucala process.

## 2017-11-25 ENCOUNTER — Encounter: Payer: Self-pay | Admitting: Pulmonary Disease

## 2017-11-30 NOTE — Telephone Encounter (Signed)
Pt came by office to sign forms for Nucala. Nothing further needed.

## 2017-11-30 NOTE — Telephone Encounter (Signed)
Patient in lobby to sign papers Please advise

## 2017-12-07 DIAGNOSIS — I10 Essential (primary) hypertension: Secondary | ICD-10-CM | POA: Diagnosis not present

## 2017-12-07 DIAGNOSIS — E119 Type 2 diabetes mellitus without complications: Secondary | ICD-10-CM | POA: Diagnosis not present

## 2017-12-08 ENCOUNTER — Other Ambulatory Visit: Payer: Self-pay | Admitting: Pulmonary Disease

## 2017-12-08 NOTE — Telephone Encounter (Signed)
Please call regarding summary of benefits for Nucala.

## 2017-12-08 NOTE — Telephone Encounter (Signed)
Called [facility] back and spoke to [place] who states patient not eligible for patient assistance program. She was contacted and made aware they have another program Medicare PEP. Pt was informed to submit proof of income, rx spending for 2019 and # of people in the household. Eligibility requirements 65 plus and if qualified she will get free meds thru the end of 2019. Nothing further needed until patient providers requested documents.  [id number] Patient ID [facility] to [place].

## 2017-12-13 ENCOUNTER — Telehealth: Payer: Self-pay | Admitting: Pulmonary Disease

## 2017-12-13 NOTE — Telephone Encounter (Signed)
I called pt to ask her if anyone had gotten in touch with her about her Nucala, Yes, she was told to bring in her tax return for 2018 or the amt. of her total income. Will wait for pt to come in or to receive another fax from [place].

## 2017-12-14 DIAGNOSIS — M810 Age-related osteoporosis without current pathological fracture: Secondary | ICD-10-CM | POA: Diagnosis not present

## 2017-12-14 DIAGNOSIS — E119 Type 2 diabetes mellitus without complications: Secondary | ICD-10-CM | POA: Diagnosis not present

## 2017-12-14 DIAGNOSIS — E782 Mixed hyperlipidemia: Secondary | ICD-10-CM | POA: Diagnosis not present

## 2017-12-14 DIAGNOSIS — J4541 Moderate persistent asthma with (acute) exacerbation: Secondary | ICD-10-CM | POA: Diagnosis not present

## 2017-12-14 DIAGNOSIS — E039 Hypothyroidism, unspecified: Secondary | ICD-10-CM | POA: Diagnosis not present

## 2017-12-14 DIAGNOSIS — C541 Malignant neoplasm of endometrium: Secondary | ICD-10-CM | POA: Diagnosis not present

## 2017-12-14 DIAGNOSIS — I10 Essential (primary) hypertension: Secondary | ICD-10-CM | POA: Diagnosis not present

## 2017-12-14 DIAGNOSIS — Z862 Personal history of diseases of the blood and blood-forming organs and certain disorders involving the immune mechanism: Secondary | ICD-10-CM | POA: Diagnosis not present

## 2017-12-14 DIAGNOSIS — J82 Pulmonary eosinophilia, not elsewhere classified: Secondary | ICD-10-CM | POA: Diagnosis not present

## 2017-12-14 NOTE — Telephone Encounter (Signed)
Patient dropping off income information . Placed in nurse box .

## 2017-12-14 NOTE — Telephone Encounter (Signed)
Information faxed to [employer], left VM for patient to let us know if she wants to pick her paperwork up or for [place] to shred it.

## 2017-12-14 NOTE — Telephone Encounter (Signed)
Pt. Dropped of proof of income, w-2 form, and SS# at the [place] office. Erasmo Downer, called here to ask me what to fax. We dicussed it, and she said she would fax the info to [facility]. Nothing further needed at this time.  Routing to [place].

## 2017-12-16 NOTE — Telephone Encounter (Signed)
Spoke to [employer] to [place], they requested a list of her medications and her out of pocket paid. I faxed this to [facility] today.

## 2017-12-29 IMAGING — CR DG CHEST 2V
1 series · 2 of 2 positions shown · non-contrast
Comparison: 07/03/2015

CLINICAL DATA: Sharp upper back pain

EXAM:
CHEST  2 VIEW

[Series 1: dg chest 2 view · 0.14mm/px · 2 of 2 slices shown]
[im 1/2]
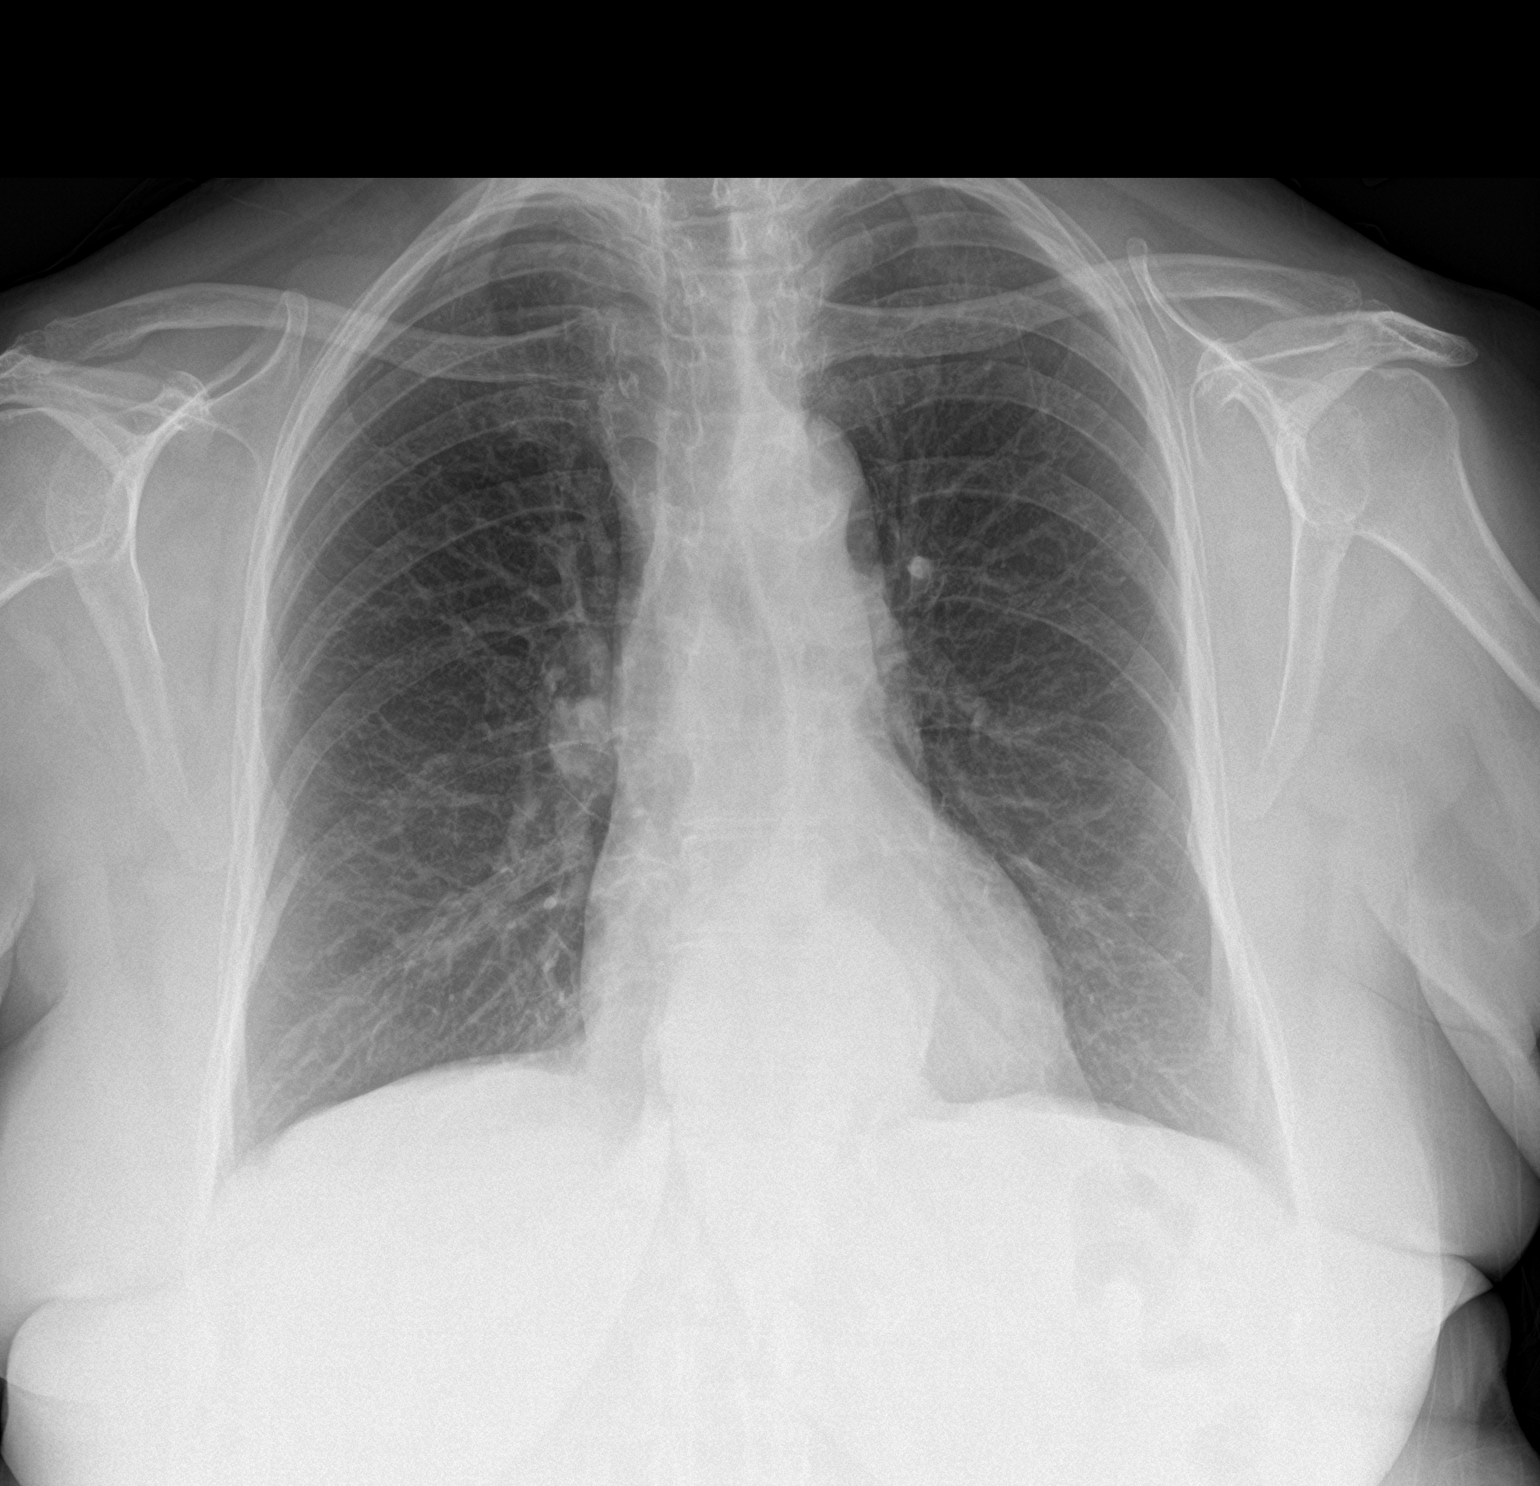
[im 2/2]
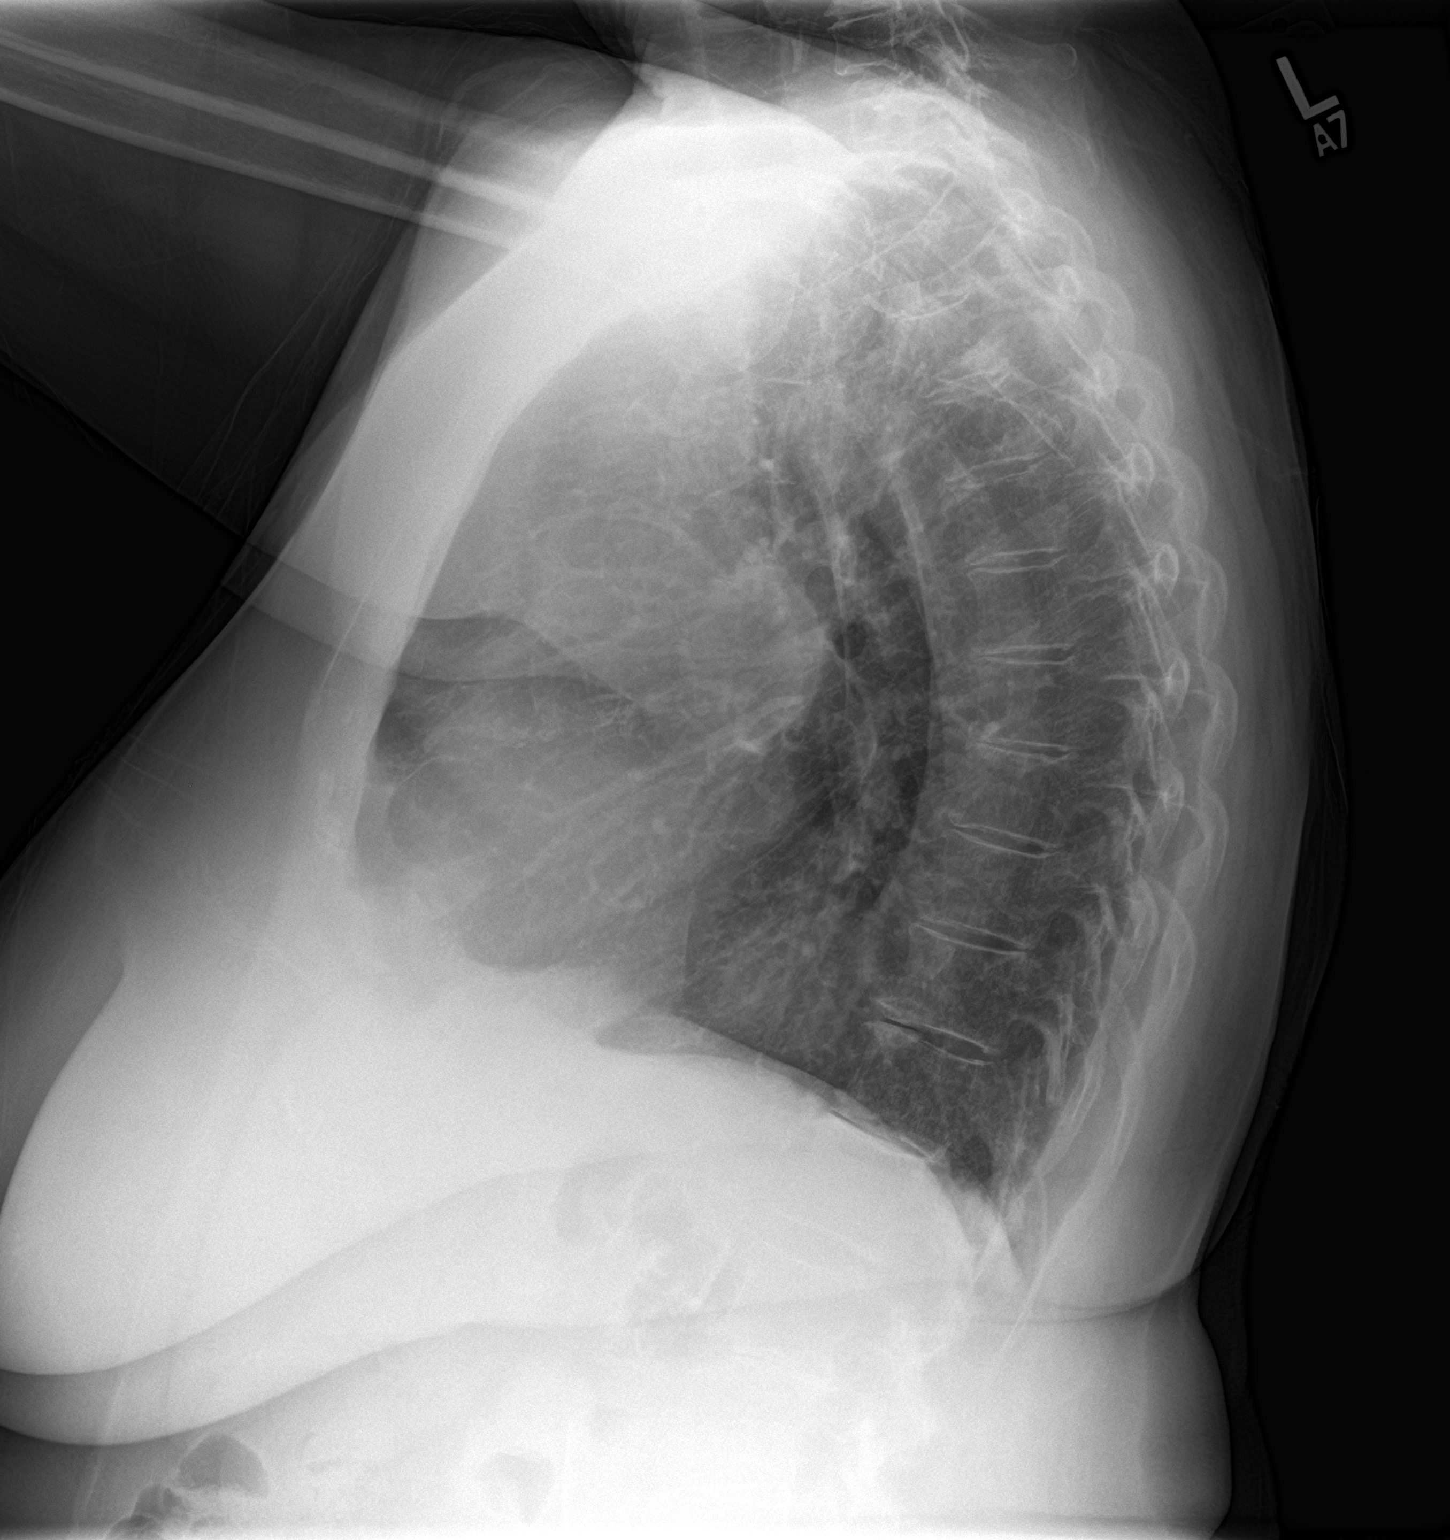

[2 of 2 positions shown; findings below may reference images not displayed]

FINDINGS: Lungs are clear.  No pleural effusion or pneumothorax.

The heart is normal in size.

Visualized osseous structures are within normal limits.
IMPRESSION: Normal chest radiographs.

## 2018-01-03 ENCOUNTER — Telehealth: Payer: Self-pay

## 2018-01-03 NOTE — Telephone Encounter (Signed)
Called [facility] to figure out where we were with the [place]. They made multiple attempts to call patient without response on 12/10/17. I called Ms. Cumbo and was able to speak with her. She stated she is unable to afford the injections at this time. I notified [facility] and [facility] to [place]. Nothing further needed at this time.

## 2018-01-25 ENCOUNTER — Ambulatory Visit (INDEPENDENT_AMBULATORY_CARE_PROVIDER_SITE_OTHER): Payer: Medicare Other | Admitting: Pulmonary Disease

## 2018-01-25 ENCOUNTER — Telehealth: Payer: Self-pay

## 2018-01-25 ENCOUNTER — Encounter: Payer: Self-pay | Admitting: Pulmonary Disease

## 2018-01-25 VITALS — BP 140/66 | HR 105 | Resp 16 | Ht 59.0 in | Wt 152.0 lb

## 2018-01-25 DIAGNOSIS — J4551 Severe persistent asthma with (acute) exacerbation: Secondary | ICD-10-CM | POA: Diagnosis not present

## 2018-01-25 DIAGNOSIS — D721 Eosinophilia, unspecified: Secondary | ICD-10-CM

## 2018-01-25 MED ORDER — ALBUTEROL SULFATE (2.5 MG/3ML) 0.083% IN NEBU: 2.5000 mg | INHALATION_SOLUTION | Freq: Four times a day (QID) | RESPIRATORY_TRACT | 5 refills | Status: AC | PRN

## 2018-01-25 NOTE — Progress Notes (Signed)
PULMONARY OFFICE FOLLOW UP  PT PROFILE: 77 y.o. F never smoker with history of asthma, previously patient of Dr. Stevenson Clinch  DATA: 05/08/14 PFTs: FVC: 2.07 > 2.12 L (88 > 90 %pred), FEV1: 1.43 > 1.60 L (82 > 92 %pred), FEV1/FVC: 69%, TLC: 4.34 L (97 %pred), DLCO 67 %pred, DLCO/VA 81% predicted 09/24/16 CT chest: Peripheral wedge-shaped area of subsegmental atelectasis noted within the anterior left upper lobe. Scar versus subsegmental atelectasis noted within the posteromedial right lung base and right middle lobe 11/18/17 PFTs: FVC: 1.56 > 1.61 L (69 > 71 %pred), FEV1: 1.03 > 1.00 L (59 > 57 %pred), FEV1/FVC: 66%, TLC: 4.90 L (109 %pred), DLCO measurement invalid    INTERVAL:  Last seen 11/23/17.  At that time, montelukast was initiated and eosinophil count was rechecked and elevated @ 2800!!!. We have been working to get her approved for Nucala (mepolizumab). Approx 10 days ago, she went to urgent care for increased dyspnea.  She was prescribed a tapering dose of prednisone and is completing that taper today.  SUBJ: This is a scheduled follow-up.  Events as noted above.  Now she is back to her previous baseline (completing a prednisone taper).  She has no new complaints.  She continues to have daily dyspnea.  She is using her albuterol rescue inhaler on average twice a day.  She is out of nebulizer solution.  She remains on Advair 250/50 and montelukast.  When we initiated the montelukast, she felt that it improved her symptoms.  Presently, she denies CP, fever, purulent sputum, hemoptysis, LE edema and calf tenderness.   OBJ: Vitals:   01/25/18 0951 01/25/18 0957  BP:  140/66  Pulse:  (!) 105  Resp: 16   SpO2:  100%  Weight: 152 lb (68.9 kg)   Height: 4\' 11"  (1.499 m)   Room air  Gen: NAD HEENT: NCAT, sclera white Neck: No JVD Lungs: breath sounds full, minimal scattered wheezes Cardiovascular: RRR, no murmurs Abdomen: Soft, nontender, normal BS Ext: without clubbing, cyanosis,  edema Neuro: grossly intact Skin: Limited exam, no lesions noted   BMP Latest Ref Rng & Units 09/16/2016 07/21/2016 06/23/2016  Glucose 65 - 99 mg/dL - 134(H) 77  BUN 6 - 20 mg/dL - 32(H) 21(H)  Creatinine 0.44 - 1.00 mg/dL 1.34(H) 1.20(H) 1.21(H)  BUN/Creat Ratio 11 - 26 - - -  Sodium 135 - 145 mmol/L - 134(L) 138  Potassium 3.5 - 5.1 mmol/L - 3.9 3.9  Chloride 101 - 111 mmol/L - 103 104  CO2 22 - 32 mmol/L - 23 26  Calcium 8.9 - 10.3 mg/dL - 9.3 9.4   CBC Latest Ref Rng & Units 11/23/2017 11/12/2016 11/05/2016  WBC 4.0 - 10.5 K/uL 8.8 5.1 4.6  Hemoglobin 12.0 - 15.0 g/dL 11.3(L) 11.2(L) 10.7(L)  Hematocrit 36.0 - 46.0 % 36.2 32.9(L) 31.7(L)  Platelets 150 - 400 K/uL 374 317 282   CXR: No new film   IMPRESSION: Severe persistent asthma without complication  Eosinophilia   Recent asthma exacerbation seems to be resolving with prednisone taper.  PLAN:   We again discussed getting biological therapy (Nucala) initiated.  Seems to be all set up and ready to go.  She understands she will need to get injections done in [place] and that they occur monthly.  Continue Advair, montelukast (Singulair) as previously scheduled Continue albuterol (inhaler or nebulizer) as needed for increased SOB, wheezing, chest tightness, cough Complete prednisone as recently prescribed Form for handicap placard has been completed per her  request Albuterol nebulizer solution has been refilled  Follow-up in 2 months.  Call sooner if needed    Merton Border, MD PCCM service Mobile [phone] Pager [phone] 01/25/2018 10:03 AM

## 2018-01-25 NOTE — Telephone Encounter (Signed)
Fax received yesterday from [facility] to [place] stating patient's Patient Assistance Program (PAP) application has been approved for one year. Patient seen in the office today and she does wish to proceed with Nucala. I called Nucala [phone]) to get this started so patient may get first injection. After quite the runaround, I was finally able to speak to the [facility] Patient Assistance Program [phone]) to initiate a profile for this patient. Will call back in 48 hours to confirm receipt of prescription and get ship out date.  Also, patient is only qualified for PAP for the remainder of this year then will have to reapply for next year. Patient aware.  Per Dorian Pod at [place], the patient may be able to get injections at an infusion center. Raquel Sarna and Dorian Pod will be in touch with this possibility.  Patient aware of all, I will contact her when I get a ship date for her first dose. Then [facility] will contact patient for first injection.

## 2018-01-25 NOTE — Patient Instructions (Signed)
Nucala has been approved and we will get it initiated as soon as possible Continue Advair, montelukast (Singulair) as previously scheduled Continue albuterol (inhaler or nebulizer) as needed for increased shortness of breath, wheezing, chest tightness, cough Complete prednisone as recently prescribed Form for handicap placard has been completed Albuterol nebulizer solution has been refilled  Follow-up in 2 months.  Call sooner if needed

## 2018-01-31 ENCOUNTER — Telehealth: Payer: Self-pay | Admitting: Pulmonary Disease

## 2018-01-31 NOTE — Telephone Encounter (Signed)
I called Janett Billow at the [facility] PA line [phone]). She stated they have the RX, but the RX was on hold until they talked to the patient. I then called Ms. Guillotte and notified her that she needs to call them to confirm address and delivery date. She was going to do that upon completion of call. I let Tammy Scott and Clayborne Dana in [place] office know that patient will get delivery this week and they need to contact her to schedule injection for Thursday or Friday. Tammy aware. Nothing further at this time.

## 2018-02-01 ENCOUNTER — Telehealth: Payer: Self-pay | Admitting: Pulmonary Disease

## 2018-02-01 ENCOUNTER — Telehealth: Payer: Self-pay | Admitting: *Deleted

## 2018-02-01 MED ORDER — EPINEPHRINE 0.3 MG/0.3ML IJ SOAJ: 0.3000 mg | Freq: Once | INTRAMUSCULAR | 11 refills | Status: DC

## 2018-02-01 NOTE — Telephone Encounter (Signed)
Rx sent 

## 2018-02-02 NOTE — Telephone Encounter (Signed)
Due to Joellen Jersey being in injections since Tammy S is out, routing to [place]. Katie please advise on this when you can get pt scheduled for her nucala injection. Thanks!

## 2018-02-03 MED ORDER — EPINEPHRINE 0.3 MG/0.3ML IJ SOAJ: 0.3000 mg | Freq: Once | INTRAMUSCULAR | 11 refills | Status: AC

## 2018-02-03 NOTE — Telephone Encounter (Signed)
Spoke with pt-states she was told by [place] [place] that Epipen is on back order and her insurance will not cover for epinephrine. I have attempted to call pharmacy several times with no success to speak with anyone. I reached out patient to see if she would like to try another local pharmacy-She requested I try [facility] [place]. [facility] has Rx on hand. I have given them a verbal Rx and patient is aware. She is going by the pharmacy now to give her insurance card information. If she has any issues with getting Rx she will contact me directly. Otherwise we will plan on patient getting her first Nucala injection in our office tomorrow. Nothing more needed at this time.

## 2018-02-03 NOTE — Telephone Encounter (Signed)
Katie, please advise on this for pt. Thanks!

## 2018-02-03 NOTE — Telephone Encounter (Signed)
Patient calling once again about not being able to get Epi-Pen from pharmacy.  Her injection is scheduled for tomorrow and she is asking for a call back as soon as possible.  She is wanting to know if she can get epinephrine instead at the pharmacy.  CB is [phone].

## 2018-02-04 ENCOUNTER — Ambulatory Visit (INDEPENDENT_AMBULATORY_CARE_PROVIDER_SITE_OTHER): Payer: Medicare Other

## 2018-02-04 DIAGNOSIS — D721 Eosinophilia, unspecified: Secondary | ICD-10-CM

## 2018-02-04 MED ORDER — MEPOLIZUMAB 100 MG ~~LOC~~ SOLR
100.0000 mg | Freq: Once | SUBCUTANEOUS | Status: AC
  Administered 2018-02-04: 100 mg via SUBCUTANEOUS

## 2018-02-17 ENCOUNTER — Telehealth: Payer: Self-pay | Admitting: Pulmonary Disease

## 2018-02-21 ENCOUNTER — Telehealth: Payer: Self-pay | Admitting: Pulmonary Disease

## 2018-02-21 NOTE — Telephone Encounter (Signed)
Anita Kent please make sure you have sample to give patient for Nucala auto injector. Also, we need to make sure to contact [facility] to [place] and seek assistance for cost of medication. Pt will have high cost each month. Thanks.

## 2018-02-21 NOTE — Telephone Encounter (Signed)
[place] staff please advise if this message has been handled.  Thanks!

## 2018-02-21 NOTE — Telephone Encounter (Signed)
Routing back to [employer] for follow-up.

## 2018-02-21 NOTE — Telephone Encounter (Signed)
I did not get a chance to call [facility] today. I will call them 02/22/2018.

## 2018-02-21 NOTE — Telephone Encounter (Signed)
Yes, we have a sample of Nucala auto injector. I'll call [facility] when I get a chance.  Routing to Joellen Jersey to make her aware.

## 2018-02-21 NOTE — Telephone Encounter (Signed)
Spoke to patient, there are no enrollment forms for her to fill out as of yet per Nucala.   Patient is still awaiting call from [facility] regarding Jan injection and she has a few more questions. Will forward to K. Welchel.

## 2018-02-22 NOTE — Telephone Encounter (Signed)
Pt is calling back about Nucala inj. Cb is [phone].

## 2018-02-28 NOTE — Telephone Encounter (Signed)
Call [employer] the rep said they need proof of income. Also we have a sample auto injector which we asked for for pt b/c we knew we would have to start the process over for the auto injector.

## 2018-02-28 NOTE — Telephone Encounter (Signed)
Pt had talked to rep at [employer] and they told her PAN would have funds available.  She asked be about PAN, I asked to talk [employer] back they can better assist her with that. (Phone # at their finger tips, and they deal with the foundation programs more.)

## 2018-02-28 NOTE — Telephone Encounter (Signed)
Pt is calling back about the Nucala inj  [phone]

## 2018-02-28 NOTE — Telephone Encounter (Signed)
Pt calling regarding Nucala.  Pt aware that message is being sent to [facility].  Tammy please advise. Thanks

## 2018-03-01 NOTE — Telephone Encounter (Signed)
Will route to Tammy S, to follow up

## 2018-03-01 NOTE — Telephone Encounter (Signed)
Called pt, had to leave a message. Waiting for pt tcb.Anita Kent

## 2018-03-01 NOTE — Telephone Encounter (Signed)
Pt is calling Tammy back [phone]

## 2018-03-02 NOTE — Telephone Encounter (Signed)
I called pt back, she is coming to get the sample we ordered for her per D Simonds. Pt will be giving herself injections, lives in [place], [place]. Pt needs co-pay asst. Would need inj. Before she could get co-pay asst.. Pt is coming in Fri.. Nothing further needed.

## 2018-03-02 NOTE — Telephone Encounter (Signed)
Patient returned phone call; pt contact # [phone]

## 2018-03-02 NOTE — Telephone Encounter (Signed)
Patient returning Tammy's phone call.  Phone number is [phone].

## 2018-03-04 ENCOUNTER — Ambulatory Visit: Payer: Medicare Other

## 2018-03-04 IMAGING — CT CT CHEST W/ CM
2 of 3 series · 15 of 32 positions shown, 18 images · IV contrast (iopamidol)
Comparison: None.

CLINICAL DATA: New diagnosis of endometrial carcinoma.

EXAM:
CT CHEST, ABDOMEN, AND PELVIS WITH CONTRAST
TECHNIQUE: Multidetector CT imaging of the chest, abdomen and pelvis was
performed following the standard protocol during bolus
administration of intravenous contrast.
CONTRAST:  75mL [id number] IOPAMIDOL ([id number]) INJECTION 61%

[Series 2: cap with · axial · 0.68mm/px · z∈[-871,-416]mm · 10 of 115 slices shown, 13 images]
[im 12/115  mediastinal]
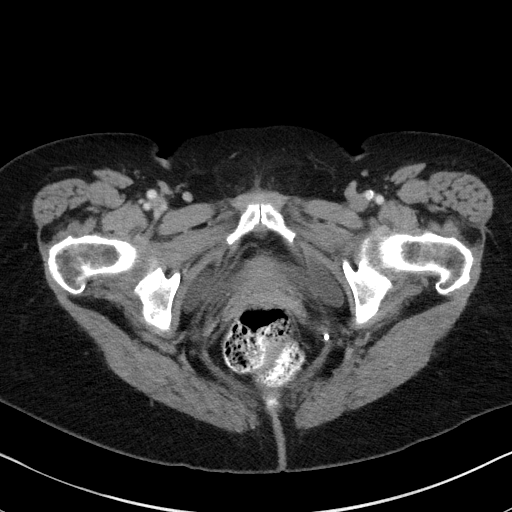
[im 12/115  lung]
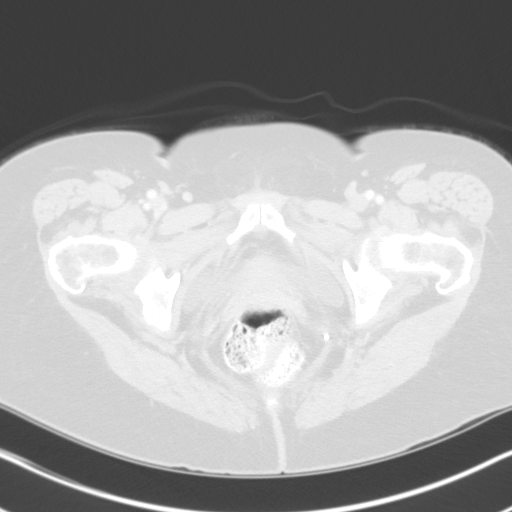
[im 23/115  lung]
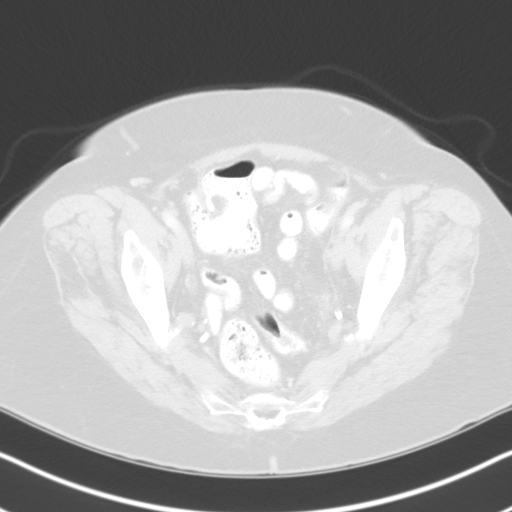
[im 35/115  lung]
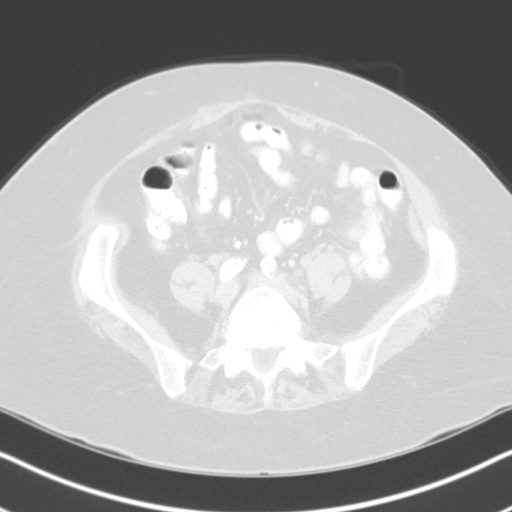
[im 46/115  lung]
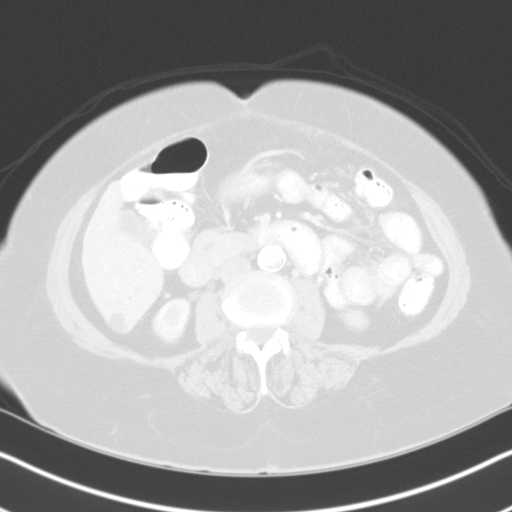
[im 56/115  mediastinal]
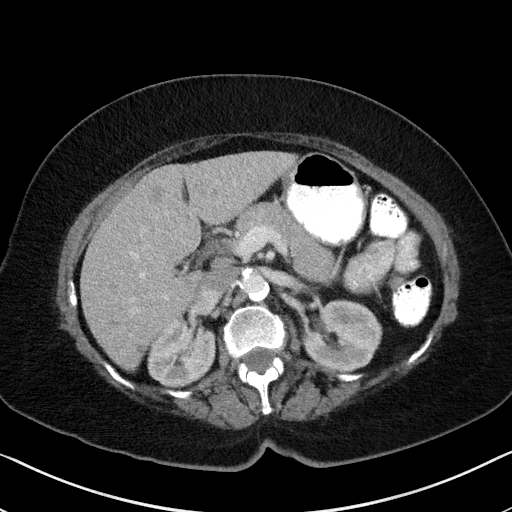
[im 56/115  lung]
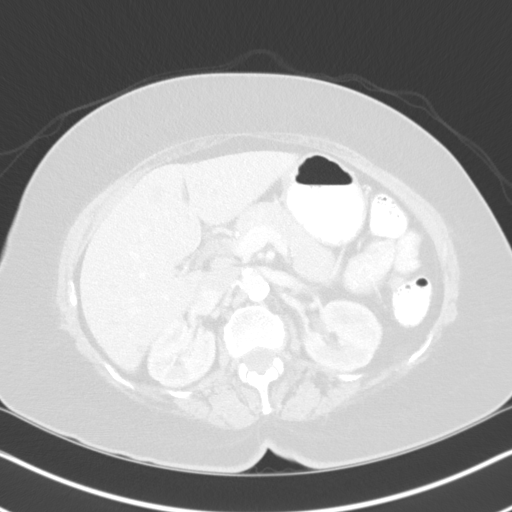
[im 58/115  lung]
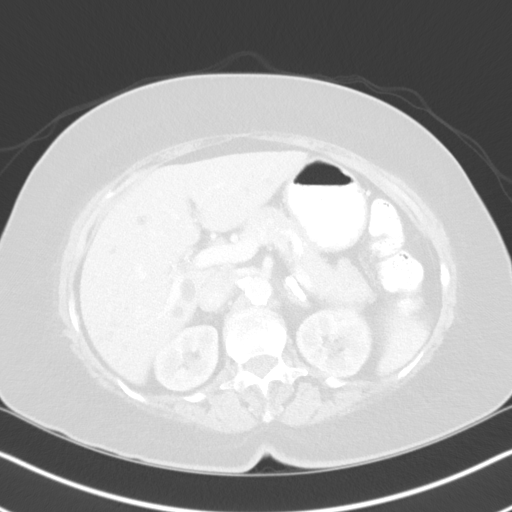
[im 69/115  lung]
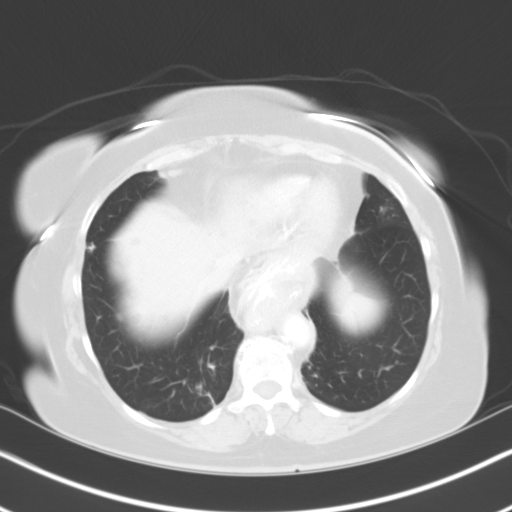
[im 80/115  lung]
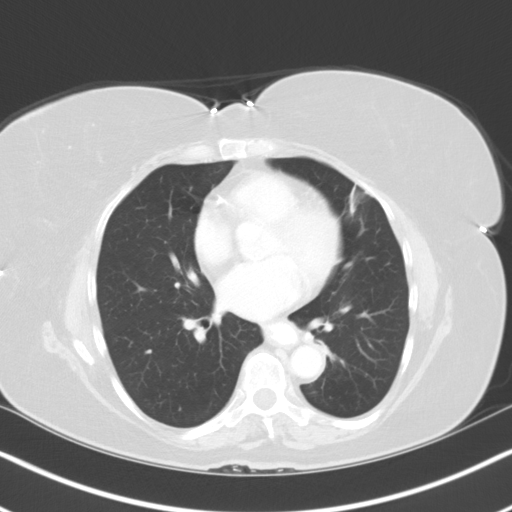
[im 92/115  mediastinal]
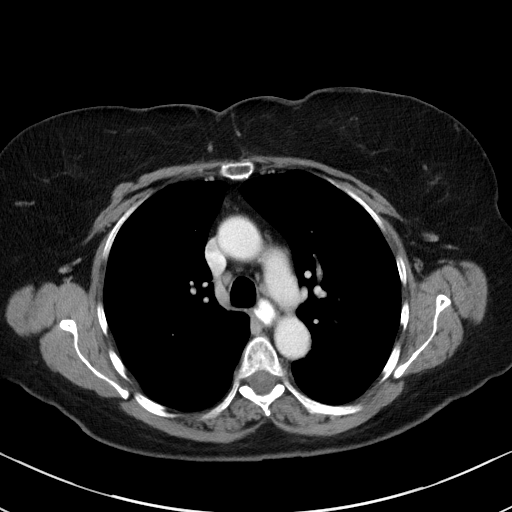
[im 92/115  lung]
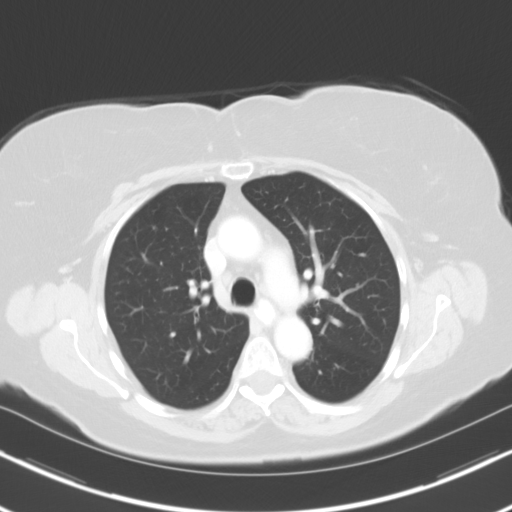
[im 103/115  lung]
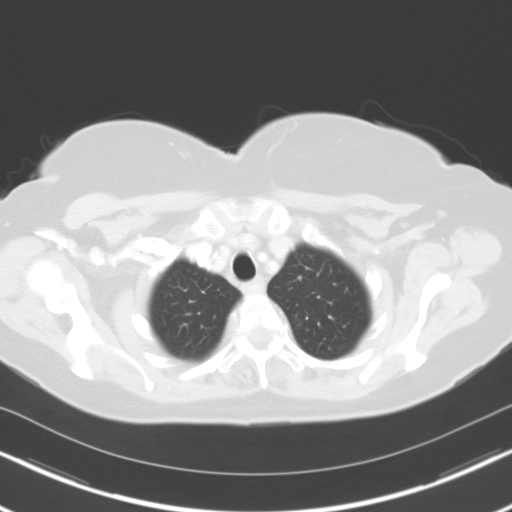

[Series 4: lung · axial · 0.68mm/px · z∈[-650,-506]mm · 5 of 150 slices shown]
[im 3/150  lung]
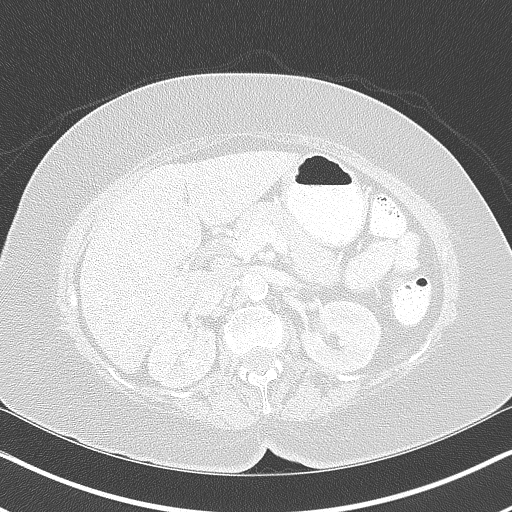
[im 23/150  lung]
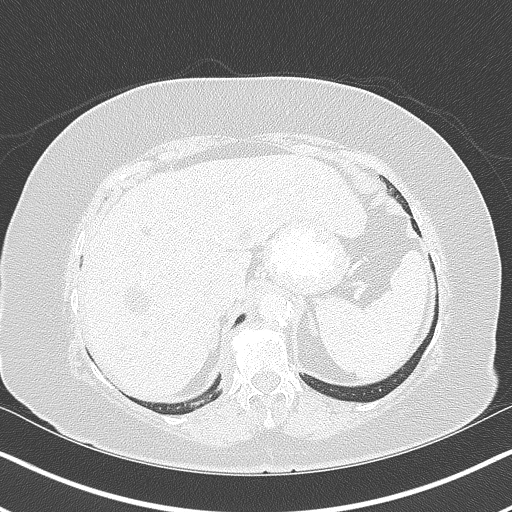
[im 46/150  lung]
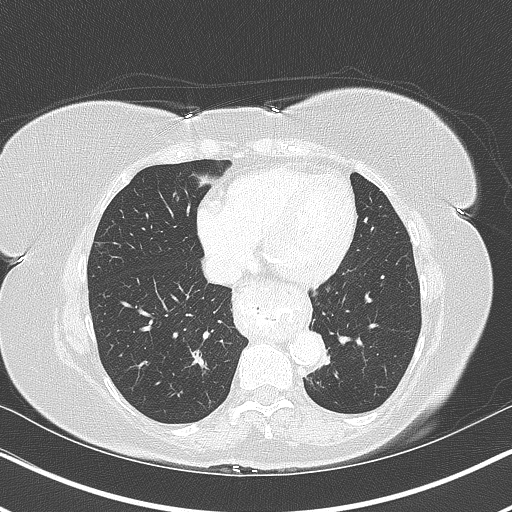
[im 69/150  lung]
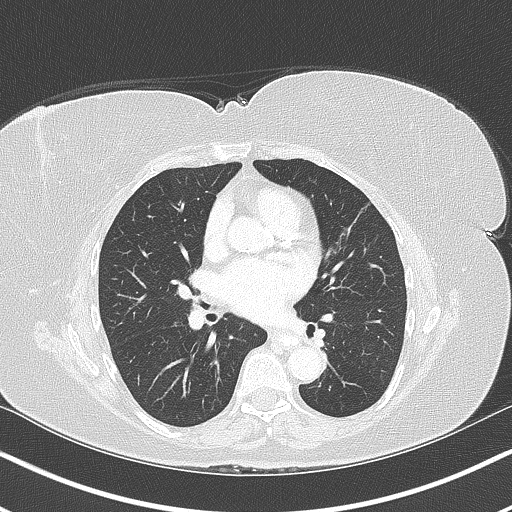
[im 75/150  lung]
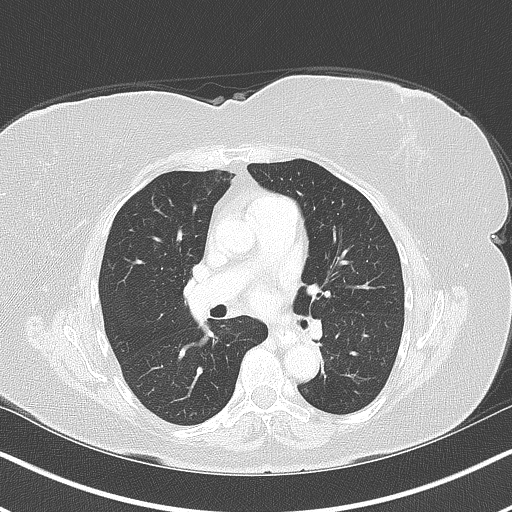

[15 of 32 positions shown; findings below may reference images not displayed]

FINDINGS: CT CHEST FINDINGS

Cardiovascular: The heart size appears normal. Aortic
atherosclerosis identified. Calcification involving the RCA and LAD
coronary artery identified. No pericardial effusion identified.

Mediastinum/Nodes: The trachea appears patent and is midline.
Moderate size hiatal hernia identified. No mediastinal or hilar
adenopathy identified.

Lungs/Pleura: No pleural effusion. Peripheral wedge-shaped area of
subsegmental atelectasis noted within the anterior left upper lobe,
image 90 of series 4. Scar versus subsegmental atelectasis noted
within the posteromedial right lung base and right middle lobe.

Musculoskeletal: No chest wall mass or suspicious bone lesions
identified.

CT ABDOMEN PELVIS FINDINGS

Hepatobiliary: Multiple cysts identified within the liver. Too
numerous to count. 1.5 cm, image 67 of series 2. There is focal
ectasia of the distal common bile duct which measures up to 5.4 mm.
No pancreatic mass or inflammation.

Pancreas: Unremarkable. No pancreatic ductal dilatation or
surrounding inflammatory changes.

Spleen: The adrenal glands appear normal. Normal appearance of both
kidneys. Urinary bladder is unremarkable.

Adrenals/Urinary Tract: Moderate size hiatal hernia. The small bowel
loops have a normal course and caliber. No obstruction. Unremarkable
appearance of the colon.

Stomach/Bowel: Aortic atherosclerosis. No aneurysm. Soft tissue
attenuating structure within the left iliac fossa has a short axis
of 1.3 cm and suspicious for enlarged lymph node. No additional
enlarged pelvic or inguinal lymph nodes.

Vascular/Lymphatic: Aortic atherosclerosis. No aneurysm. No
abdominal or pelvic adenopathy.

Reproductive: Previous hysterectomy and bilateral salpingo
oophorectomy. No adnexal mass.

Other: There is no ascites or focal fluid collections within the
abdomen or pelvis.

Musculoskeletal: Degenerative disc disease noted within the lumbar
spine.
IMPRESSION: 1. Status post hysterectomy and bilateral salpingo oophorectomy.
2. Within the left iliac fossa there is a soft tissue attenuating
structure with a short axis of 1.3 cm and is suspicious for
adenopathy. Suggest further evaluation with PET-CT.
3. No evidence for distant metastatic disease.
4. Hiatal hernia
5. Aortic Atherosclerosis ([id number]). Multi vessel coronary
artery calcifications.

## 2018-03-08 ENCOUNTER — Telehealth: Payer: Self-pay | Admitting: Pulmonary Disease

## 2018-03-08 NOTE — Telephone Encounter (Signed)
Called [facility] at [place] request to complete a PA for pt's Nucala for 2020 calendar year.    Member ID: [id number].  Spoke with representative Art, who verified that a PA was completed last October and is approved through 02/16/2019.  Reference #: [id number].  Routing to [employer] to make aware of approval.

## 2018-03-09 IMAGING — CT NM PET TUM IMG INITIAL (PI) SKULL BASE T - THIGH
1 of 8 series · 1 of 25 positions shown · non-contrast
Comparison: CT scan dated 09/24/2016

CLINICAL DATA: Initial treatment strategy for FIGO state IIIA
endometrial cancer.

EXAM:
NUCLEAR MEDICINE PET SKULL BASE TO THIGH
TECHNIQUE: 13.1 mCi F-18 FDG was injected intravenously. Full-ring PET imaging
was performed from the skull base to thigh after the radiotracer. CT
data was obtained and used for attenuation correction and anatomic
localization.
FASTING BLOOD GLUCOSE:  Value: 100 mg/dl

[Series 3: ct wb 5.0 b30f · axial · 5.0mm · 0.98mm/px · 1 of 251 slices shown]
[im 251/251  brain]
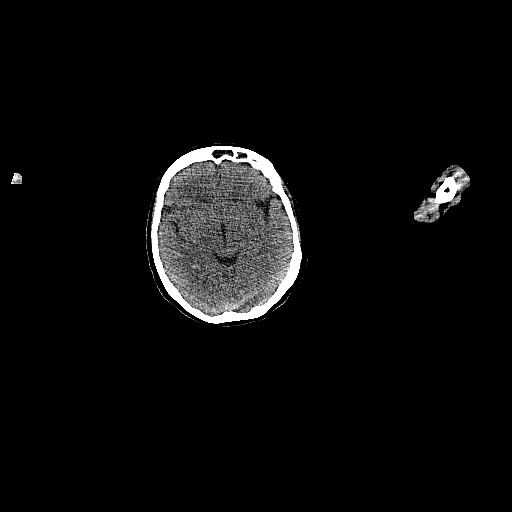

[1 of 25 positions shown; findings below may reference images not displayed]

FINDINGS: NECK

No hypermetabolic lymph nodes in the neck.

Complete opacification of the frontal sinuses, ethmoid air cells,
and right sphenoid sinus. Subtotal opacification of the maxillary
sinuses and left sphenoid sinus.

Atherosclerotic calcification of the common carotid arteries.

CHEST

No hypermetabolic mediastinal or hilar nodes. No suspicious
pulmonary nodules on the CT data.

Moderate-sized hiatal hernia. Coronary, aortic arch, and branch
vessel atherosclerotic vascular disease. Several small paratracheal
lymph nodes are not pathologically enlarged and not hypermetabolic.

ABDOMEN/PELVIS

Photopenic cysts in the right hepatic lobe. Dependent density in the
gallbladder favoring gallstone. Aortoiliac atherosclerotic vascular
disease.

The lesion of concern along the left iliac fossa is at the immediate
termination of the left ovarian vein, measures 1.0 by 1.6 cm, and
has a maximum standard uptake value 3.4 indicating low-grade
activity. Background blood pool activity in the mediastinum has an
SUV of 2.9.

SKELETON

Degenerative anterolisthesis at L5-S1. No hypermetabolic osseous
metastatic disease observed.
IMPRESSION: 1. The lesion of concern along the left pelvis is that the immediate
termination of the ovarian vein and has only low-grade activity
which would be unusual for active malignancy. The appearance
suggests a small left ovarian remnant or dilated termination of the
ovarian vein rather than adenopathy although likely merits
surveillance by cross-sectional imaging.
2. Extensive paranasal sinusitis.
3. Photopenic hepatic cysts.
4. Other imaging findings of potential clinical significance:
Moderate-sized hiatal hernia. Aortic Atherosclerosis ([id number]).
Coronary atherosclerosis. Cholelithiasis.

## 2018-03-10 NOTE — Telephone Encounter (Signed)
Waiting for pt to call me back.

## 2018-03-10 NOTE — Telephone Encounter (Signed)
I talked to pt 03/08/2018, Her P/A is good, She has co-pay assistant thru [employer] until her deductible and OOP are met. Once those are met, pt will be able to get free medicine thru [facility]. Pt wants to know if Bethena Roys is going to send her her Nucala for her Feb. Inj.Marland Kitchen Pt called to let me know what they said, I had to call her back and leave a message.

## 2018-03-10 NOTE — Telephone Encounter (Signed)
Pt is calling back about the Nucala inj   [phone]

## 2018-03-14 NOTE — Telephone Encounter (Signed)
Spoke with pt., going to call pharm. For her to see if they will be able to ship her Dupixent to her home for her Feb. Dose.

## 2018-03-17 ENCOUNTER — Ambulatory Visit (INDEPENDENT_AMBULATORY_CARE_PROVIDER_SITE_OTHER): Payer: Medicare Other | Admitting: Pulmonary Disease

## 2018-03-17 ENCOUNTER — Encounter: Payer: Self-pay | Admitting: Pulmonary Disease

## 2018-03-17 VITALS — BP 140/68 | HR 85 | Ht 59.0 in | Wt 154.6 lb

## 2018-03-17 DIAGNOSIS — D721 Eosinophilia, unspecified: Secondary | ICD-10-CM

## 2018-03-17 DIAGNOSIS — J4550 Severe persistent asthma, uncomplicated: Secondary | ICD-10-CM | POA: Diagnosis not present

## 2018-03-17 MED ORDER — MONTELUKAST SODIUM 10 MG PO TABS: 10.0000 mg | ORAL_TABLET | Freq: Every day | ORAL | 4 refills | Status: DC

## 2018-03-17 NOTE — Telephone Encounter (Addendum)
I called [facility] to make sure pt will be able to have her auto injector sent to her home from now on. After 2 reps and a Pharmacist, "Yes". When pt orders her Nucala for the first time it should be sent to her home. They will confirm her address ea. Time. Called pt.and made her aware. Nothing further needed.

## 2018-03-17 NOTE — Progress Notes (Signed)
PULMONARY OFFICE FOLLOW UP  PT PROFILE: 78 y.o. F never smoker with history of asthma, previously patient of Dr. Stevenson Clinch  DATA: 05/08/14 PFTs: FVC: 2.07 > 2.12 L (88 > 90 %pred), FEV1: 1.43 > 1.60 L (82 > 92 %pred), FEV1/FVC: 69%, TLC: 4.34 L (97 %pred), DLCO 67 %pred, DLCO/VA 81% predicted 09/24/16 CT chest: Peripheral wedge-shaped area of subsegmental atelectasis noted within the anterior left upper lobe. Scar versus subsegmental atelectasis noted within the posteromedial right lung base and right middle lobe 11/18/17 PFTs: FVC: 1.56 > 1.61 L (69 > 71 %pred), FEV1: 1.03 > 1.00 L (59 > 57 %pred), FEV1/FVC: 66%, TLC: 4.90 L (109 %pred), DLCO measurement invalid    INTERVAL:  Last seen 01/25/2018.  Since then,  Nucala (mepolizumab) has been initiated  SUBJ: This is a scheduled follow-up.  Since initiation of Nucala, her symptoms are markedly improved.  She has very mild exertional dyspnea.  Her cough is all but resolved.  She has not required albuterol rescue inhaler.  She remains on Advair and Singulair.  She has no new complaints.  She denies CP, fever, purulent sputum, hemoptysis, LE edema and calf tenderness.   OBJ: Vitals:   03/17/18 1329 03/17/18 1331  BP:  140/68  Pulse:  85  SpO2:  98%  Weight: 154 lb 9.6 oz (70.1 kg)   Height: 4\' 11"  (1.499 m)   Room air  Gen: NAD HEENT: NCAT, sclera white Neck: No JVD Lungs: breath sounds full, faint isolated wheeze in RLL posteriorly Cardiovascular: RRR, no murmurs Abdomen: Soft, nontender, normal BS Ext: without clubbing, cyanosis, edema Neuro: grossly intact Skin: Limited exam, no lesions noted   BMP Latest Ref Rng & Units 09/16/2016 07/21/2016 06/23/2016  Glucose 65 - 99 mg/dL - 134(H) 77  BUN 6 - 20 mg/dL - 32(H) 21(H)  Creatinine 0.44 - 1.00 mg/dL 1.34(H) 1.20(H) 1.21(H)  BUN/Creat Ratio 11 - 26 - - -  Sodium 135 - 145 mmol/L - 134(L) 138  Potassium 3.5 - 5.1 mmol/L - 3.9 3.9  Chloride 101 - 111 mmol/L - 103 104  CO2 22 - 32  mmol/L - 23 26  Calcium 8.9 - 10.3 mg/dL - 9.3 9.4   CBC Latest Ref Rng & Units 11/23/2017 11/12/2016 11/05/2016  WBC 4.0 - 10.5 K/uL 8.8 5.1 4.6  Hemoglobin 12.0 - 15.0 g/dL 11.3(L) 11.2(L) 10.7(L)  Hematocrit 36.0 - 46.0 % 36.2 32.9(L) 31.7(L)  Platelets 150 - 400 K/uL 374 317 282   CXR: No new film   IMPRESSION: Severe persistent asthma - Plan: Pulmonary Function Test [facility] Only  Eosinophilia   Excellent response to  Nucala (mepolizumab).  She wishes to pursue the autoinjector so that she can administer this medication to herself at home.  PLAN:   Continue Advair diskus, 1 inhalation twice a day Continue Singulair, 10 mg at bedtime Continue Nucala injections Follow-up in 6 months with PFTs prior to that visit.  Call sooner if needed   Merton Border, MD PCCM service Mobile [phone] Pager [phone] 03/17/2018 1:49 PM

## 2018-03-17 NOTE — Patient Instructions (Addendum)
Continue Advair discus, 1 inhalation twice a day Continue Singulair, 10 mg at bedtime Continue Nucala injections Follow-up in 6 months with PFTs (lung function test) prior to that visit.  Call sooner if needed

## 2018-04-13 ENCOUNTER — Inpatient Hospital Stay: Payer: Medicare Other | Attending: Obstetrics and Gynecology

## 2018-04-13 NOTE — Progress Notes (Deleted)
Gynecologic Oncology Interval Visit   Referring Provider: Dr. Vikki Ports Ward  Chief Concern: Endometrial cancer  Subjective:  Anita Kent is a 78 y.o. 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 Leonides Schanz and Dr. Ouida Sills.    She returns to clinic today for surveillance. Last seen in clinic by Dr. Theora Gianotti on 10/06/2017. NED at that time. She continued maintenance Megace 40 mg BID. No interval dvt.   Her PCP does her mammogram screening.   Oncology History Menopause age 9, no hormone replacement. Postmenopausal bleeding episodes noted October 2017 at primary care visit and seen eventually by Dr Leonides Schanz. She had an ultrasound in May 2018 which showed many fibroids, and a mass in the endometrial cavity.  D&C with hysteroscopy was being set up, but Cardiac Clearance was delayed  D&C 09/04/16: Uterus, mobile, normal size with grade 2 descent, sounding to 10 cm; normal cervix, atrophic vagina without lesions, normal perineum. Difficult visualization due to active bleeding, no discreet mass able to be identified, but fluffy tissue with prominent blood vessels seen.  Moderate tissue on curettage.  Upon re-entry with scope, one small lobe of fatty tissue identified, at the same time the fluid deficit instantly and dramatically increased.  In view of this she had laparoscopy and upon entry to the abdomen, about 400cc of thin bloody fluid was encountered.  The uterus was observed to have a perforation in the right cornua that was oozing, and would bleed with any manipulation.  From that defect was endometrial tissue protruding and falling into the abdomen.  Otherwise unremarkable pelvis and upper abdomen.  Bilateral tubes had been surgically altered, and bladder was thinly adherent to anterior LUS.  NO nodularity on peritoneal surfaces.  TLH/BSO performed in view of perforation and probable cancer.    Pathology 09/04/16 DIAGNOSIS:  A. ENDOMETRIUM; CURETTAGE:  - ENDOMETRIOID CARCINOMA, FIGO GRADE 1.   B. UTERUS WITH CERVIX; HYSTERECTOMY:  - ENDOMETRIOID CARCINOMA, FIGO GRADE 1, WITH DEEP MYOMETRIAL INVASION.  - NO DEFINITE CERVICAL INVOLVEMENT OR SEROSAL INVOLVEMENT.  - INTRAMURAL LEIOMYOMAS.   RIGHT FALLOPIAN TUBE; SALPINGECTOMY:  - INVOLVED BY ENDOMETRIOID CARCINOMA WITH MUSCLE INVASION.   LEFT FALLOPIAN TUBE; SALPINGECTOMY:  - NEGATIVE FOR MALIGNANCY.   RIGHT AND LEFT OVARIES; OOPHORECTOMY:  - NEGATIVE FOR MALIGNANCY.   Myometrial Invasion: Present    Depth of invasion: 15.5 mm    Myometrial thickness: 16 mm    Percentage of myometrial invasion; 97%    Distance from serosa at deepest invasion: 0.5 mm  Uterine Serosa Involvement: Not identified  Cervical Stromal Involvement: Not identified  Other Tissue/Organ Involvement: Right fallopian tube  Peritoneal/ Ascitic Fluid: Not submitted  Lymphovascular Invasion: Not identified  Regional Lymph Nodes: Not submitted  Pathologic Stage Classification (pTNM, AJCC 8th Edition): pT3a pNX/  FIGO IIIA    IHC Testing for Estrogen and Progesterone receptors:  Results: Estrogen receptor (ER) status: POSITIVE, >90% of cells with nuclear Positivity.  Progesterone receptor (PR) status: POSITIVE, >90% of cells with nuclear positivity  Average intensity of staining, ER and PR: Strong     MLH1: Intact nuclear expression  MSH2: Intact nuclear expression  MSH6: Intact nuclear expression  PMS2: Intact nuclear expression    8/18 CT scan was done to r/o metastatic disease. IMPRESSION: 1. Status post hysterectomy and bilateral salpingo oophorectomy. 2. Within the left iliac fossa there is a soft tissue attenuating structure with a short axis of 1.3  cm and is suspicious for adenopathy. Suggest further evaluation with PET-CT. 3. No evidence for distant metastatic disease. 4. Hiatal hernia 5. Aortic Atherosclerosis  (ICD10-I70.0). Multi vessel coronary artery calcifications.  8/18 PET IMPRESSION: 1. The lesion of concern along the left pelvis is that the immediate termination of the ovarian vein and has only low-grade activity which would be unusual for active malignancy. The appearance suggests a small left ovarian remnant or dilated termination of the ovarian vein rather than adenopathy although likely merits surveillance by cross-sectional imaging.  2. Extensive paranasal sinusitis. 3. Photopenic hepatic cysts. 4. Other imaging findings of potential clinical significance: Moderate-sized hiatal hernia. Aortic Atherosclerosis (ICD10-I70.0). Coronary atherosclerosis. Cholelithiasis.  She was seen in consultation by [place] and decision was made to give adjuvant radiation and she received external pelvic radiation + vaginal brachytherapy.  Recommended maintenance hormonal therapy with Megace 40 mg bid in view of ER/PR positive grade 1 cancer with deep invasion and no node sampling + adnexal involvement and uterine perforation.     Seen in clinic by Dr. Fransisca Connors on 05/12/17. NED at that time. She is on maintenance hormonal therapy with Megace 40 mg bid in view of ER/PR positive grade 1 cancer with deep invasion and no node sampling + adnexal involvement and uterine perforation.   Problem List: Patient Active Problem List   Diagnosis Date Noted  . Endometrial cancer, FIGO stage IIIA ([place]) 09/16/2016  . Sinusitis, acute 08/13/2014  . Emphysema lung ([place]) 05/08/2014  . Asthma, chronic 03/06/2014  . Cough 03/06/2014   Past Medical History: Past Medical History:  Diagnosis Date  . Asthma   . COPD (chronic obstructive pulmonary disease) ([place])   . Cough variant asthma   . Diabetes ([place])    states was told she was not diabetic tested high once on hemoglobin A1C  . Dyslipidemia   . Emphysema of lung ([place])   . endometrial cancer 08/2016   Total Hysterectomy 09/04/2016  . HTN (hypertension)   .  Hypertension   . Hypothyroidism   . Thyroid disease     Past Surgical History: Past Surgical History:  Procedure Laterality Date  . ABDOMINAL HYSTERECTOMY    . CATARACT EXTRACTION, BILATERAL    . CYSTOSCOPY N/A 09/04/2016   Procedure: CYSTOSCOPY;  Surgeon: Ward, Honor Loh, MD;  Location: [facility] ORS;  Service: Gynecology;  Laterality: N/A;  . DILATATION & CURETTAGE/HYSTEROSCOPY WITH MYOSURE N/A 09/04/2016   Procedure: DILATATION & CURETTAGE/HYSTEROSCOPY WITH MYOSURE;  Surgeon: Ward, Honor Loh, MD;  Location: [facility] ORS;  Service: Gynecology;  Laterality: N/A;  . GANGLION CYST EXCISION Left    hand  . LAPAROSCOPIC BILATERAL SALPINGO OOPHERECTOMY Bilateral 09/04/2016   Procedure: LAPAROSCOPIC BILATERAL SALPINGO OOPHORECTOMY;  Surgeon: Ward, Honor Loh, MD;  Location: [facility] ORS;  Service: Gynecology;  Laterality: Bilateral;  . LAPAROSCOPIC HYSTERECTOMY  09/04/2016   Procedure: HYSTERECTOMY TOTAL LAPAROSCOPIC;  Surgeon: Ward, Honor Loh, MD;  Location: [facility] ORS;  Service: Gynecology;;  . primary open reduction procedure Right 11/09/2013    right ankle  . TONSILLECTOMY    . TUBAL LIGATION        Family History: Family History  Problem Relation Age of Onset  . Myasthenia gravis Daughter   . Emphysema Mother   . COPD Mother   . Dementia Mother   . Stroke Father   . Heart attack Sister     Social History: Social History   Socioeconomic History  . Marital status: Widowed    Spouse name: Not on file  .  Number of children: Not on file  . Years of education: Not on file  . Highest education level: Not on file  Occupational History  . Not on file  Social Needs  . Financial resource strain: Not on file  . Food insecurity:    Worry: Not on file    Inability: Not on file  . Transportation needs:    Medical: Not on file    Non-medical: Not on file  Tobacco Use  . Smoking status: Never Smoker  . Smokeless tobacco: Never Used  Substance and Sexual Activity  . Alcohol use: No     Alcohol/week: 0.0 standard drinks  . Drug use: No  . Sexual activity: Never  Lifestyle  . Physical activity:    Days per week: Not on file    Minutes per session: Not on file  . Stress: Not on file  Relationships  . Social connections:    Talks on phone: Not on file    Gets together: Not on file    Attends religious service: Not on file    Active member of club or organization: Not on file    Attends meetings of clubs or organizations: Not on file    Relationship status: Not on file  . Intimate partner violence:    Fear of current or ex partner: Not on file    Emotionally abused: Not on file    Physically abused: Not on file    Forced sexual activity: Not on file  Other Topics Concern  . Not on file  Social History Narrative  . Not on file    Allergies: Allergies  Allergen Reactions  . Oxycodone Itching  . Sulfa Antibiotics Hives  . Amoxicillin Hives and Rash    Has patient had a PCN reaction causing immediate rash, facial/tongue/throat swelling, SOB or lightheadedness with hypotension: Yes Has patient had a PCN reaction causing severe rash involving mucus membranes or skin necrosis: Yes Has patient had a PCN reaction that required hospitalization: No Has patient had a PCN reaction occurring within the last 10 years: Yes If all of the above answers are "NO", then may proceed with Cephalosporin use.   Diona Fanti [Aspirin] Hives  . Levaquin [Levofloxacin In D5w] Hives    Current Medications: Current Outpatient Medications  Medication Sig Dispense Refill  . albuterol (PROVENTIL) (2.5 MG/3ML) 0.083% nebulizer solution Take 3 mLs (2.5 mg total) by nebulization every 6 (six) hours as needed for wheezing or shortness of breath. 150 mL 5  . Albuterol Sulfate (PROAIR RESPICLICK) 697 (90 Base) MCG/ACT AEPB Inhale 2 puffs into the lungs every 6 (six) hours as needed. 1 each 11  . Fluticasone-Salmeterol (ADVAIR) 250-50 MCG/DOSE AEPB Inhale 1 puff into the lungs 2 (two) times daily. 60  each 10  . ibuprofen (ADVIL,MOTRIN) 200 MG tablet Take 400 mg by mouth as needed for headache or moderate pain.     Marland Kitchen levothyroxine (SYNTHROID, LEVOTHROID) 75 MCG tablet Take 75 mcg by mouth daily before breakfast.    . loratadine (CLARITIN) 10 MG tablet Take 10 mg by mouth daily.    Marland Kitchen losartan-hydrochlorothiazide (HYZAAR) 50-12.5 MG per tablet Take 1 tablet by mouth daily.    . megestrol (MEGACE) 40 MG tablet Take 1 tablet (40 mg total) by mouth 2 (two) times daily. 60 tablet 6  . montelukast (SINGULAIR) 10 MG tablet Take 1 tablet (10 mg total) by mouth at bedtime. 90 tablet 4  . omeprazole (PRILOSEC OTC) 20 MG tablet Take 2 tablets (40  mg total) by mouth at bedtime. 60 tablet 10  . Probiotic CAPS Take 1 capsule by mouth daily.     No current facility-administered medications for this visit.     Review of Systems General: weight gain; no changes to appetite Skin: no complaints Eyes: no complaints HEENT: no complaints Breasts: no complaints Pulmonary: sob (chronic; no worse; hx of copd/emphysema) Cardiac: no complaints Gastrointestinal: no complaints Genitourinary/Sexual: no complaints Ob/Gyn: no complaints Musculoskeletal: no complaints Hematology: no complaints Neurologic/Psych: no complaints    Objective:  Physical Examination:  There were no vitals taken for this visit.   ECOG Performance Status: 0 - Asymptomatic   GENERAL: Patient is a well appearing female in no acute distress HEENT:  Sclerae anicteric.  Oropharynx clear and moist. Neck is supple.  NODES:  No cervical, supraclavicular, axillary, inguinal lymphadenopathy palpated.  LUNGS:  Clear to auscultation bilaterally.  No wheezes or rhonchi. HEART:  Regular rate and rhythm. No murmur appreciated. ABDOMEN:  Soft, nontender. No organomegaly palpated. Well healed surgical scars. No hernias EXTREMITIES:  No peripheral edema.   SKIN:  Clear with no obvious rashes or skin changes. No nail dyscrasia. NEURO:  Nonfocal.  Well oriented.  Appropriate affect.  Pelvic Exam: chaperoned by NP: Vulva: normal appearing without masses, tenderness or lesions. Vagina: atrophic, shortened vaginal length. No bleeding or discharge. Adnexa: normal. RV: deferred.      Assessment:  Anita Kent is a 78 y.o. 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  She had deep almost full thickness myometrial invasion and fallopian tube metastasis with spillage of cancer from uterine perforation. Now s/p pelvic radiation and no evidence of disease today. On adjuvant Megace. No evidence of disease today.   IHC shows no loss of MMR gene expression.  Plan:   Problem List Items Addressed This Visit      Other   Endometrial cancer, FIGO stage IIIA ([place]) - Primary     Continue maintenance hormonal therapy with Megace 40 mg BID in view of ER/PR positive grade 1 endometrial cancer with deep invasion, no node sampling, and positive adnexal involvement, and uterine perforation. Tolerating well. No DVT. Weight gain of 6 lbs.   Recommended continuing Megace for 2 years of therapy. We will have her return to clinic in 6 months for surveillance to see Dr. Fransisca Connors. Once she completed 2 years therapy consider alternating visits with Dr. Leonides Schanz at that time.   All questions were answered to the patient's satisfaction.    Beckey Rutter, DNP, AGNP-C [place] [facility] [phone] (work cell) [phone] (office)  I personally had a face to face interaction and evaluated the patient jointly with the NP, Ms. Beckey Rutter.  I have reviewed her history and available records and have performed the key portions of the physical exam including abdominal exam, and pelvic exam with my findings confirming those documented above by the APP.  I have discussed the case with the APP and the patient.  I agree with the above documentation,  assessment and plan which was fully formulated by me.  Counseling was completed by me.   I personally saw the patient and performed a substantive portion of this encounter in conjunction with the listed APP as documented above.  A total of 25 minutes were spent with the patient/family today; >50% was spent in education, counseling and coordination of care for endometrial cancer.  Anita Gaetana Michaelis, MD  CC:   Dr. Vikki Ports Ward

## 2018-05-02 ENCOUNTER — Telehealth: Payer: Self-pay | Admitting: Pulmonary Disease

## 2018-05-02 MED ORDER — AZITHROMYCIN 250 MG PO TABS: ORAL_TABLET | ORAL | 0 refills | Status: AC

## 2018-05-02 MED ORDER — PREDNISONE 20 MG PO TABS: 20.0000 mg | ORAL_TABLET | Freq: Every day | ORAL | 0 refills | Status: DC

## 2018-05-02 NOTE — Telephone Encounter (Signed)
z pak and Prednisone 20 mg daily for 10 days

## 2018-05-02 NOTE — Telephone Encounter (Signed)
Pt is aware of below recommendations and voiced her understanding.  Rx for zpak and prednisone has been sent to preferred pharmacy.  Nothing further is needed.  

## 2018-05-02 NOTE — Telephone Encounter (Signed)
Spoke to patient. Patient is wheezing, SOB, coughing up yellow gunk. States that after she starts wheezing, usually goes straight to lungs. Dr. Mortimer Fries, please advise.

## 2018-05-06 MED ORDER — PREDNISONE 20 MG PO TABS: 40.0000 mg | ORAL_TABLET | Freq: Every day | ORAL | 0 refills | Status: DC

## 2018-05-06 NOTE — Telephone Encounter (Signed)
Spoke to patient. She does not have a fever and is being diligent about taking her temperature. States she has finished the Zpack and her head congestion seems to be better, but her SOB and wheezing has increased. Will send to Dr. Mortimer Fries for recommendations, as Alva Garnet is out of office.

## 2018-05-06 NOTE — Addendum Note (Signed)
Addended by: Darreld Mclean on: 05/06/2018 12:36 PM   Modules accepted: Orders

## 2018-05-06 NOTE — Telephone Encounter (Signed)
After reading Dr. Zoila Shutter response, spoke to him and he increased it to 40 mg daily for 10 days. Spoke to patient, she is aware. Rx sent to [facility] in [place]. Nothing further needed at this time.

## 2018-05-06 NOTE — Telephone Encounter (Signed)
Prednisone 20 mg daily for 10 days If not better, can go to ER for further assessment

## 2018-05-16 ENCOUNTER — Telehealth: Payer: Self-pay | Admitting: Pulmonary Disease

## 2018-05-16 NOTE — Telephone Encounter (Signed)
Received a fax from [employer] to [place]. Pt is now trying to get patient assistance for 2020. Per [place] to [place] new enrollment forms will need to be filled out for the pt to be considered for this. Will route message to [facility] to fill these out and have the pt and Dr. Alva Garnet to sign them.

## 2018-05-16 NOTE — Telephone Encounter (Signed)
I spoke to patient today to let her know that I have the enrollment form filled out and she can drive by and sign them. She is getting all financial information in order then will let us know when she can come sign the paperwork. Will put in DS folder for signature.

## 2018-05-17 ENCOUNTER — Telehealth: Payer: Self-pay

## 2018-05-17 ENCOUNTER — Other Ambulatory Visit: Payer: Self-pay

## 2018-05-17 NOTE — Telephone Encounter (Signed)
I called patient and let her know that appt was changed to 5/20 and she said that Lauren the NP had already called her and moved the appt. She was appreciative to make sure everything was changed and she put the new appt 5/20 in her calendar

## 2018-05-17 NOTE — Telephone Encounter (Signed)
Patient stated that she would like to reschedule her appointment for tomorrow in a month or so since is not wanting to get out of her house due to the COVID-19. Please call patient to reschedule. Thank you.

## 2018-05-18 ENCOUNTER — Inpatient Hospital Stay: Payer: Medicare Other

## 2018-06-02 ENCOUNTER — Telehealth: Payer: Self-pay | Admitting: Pulmonary Disease

## 2018-06-02 NOTE — Telephone Encounter (Signed)
Call returned to patient, she states she wanted to come by tomorrow 06/03/2018 at 2. She states the documentation of her income she will need to make a copy of. Nothing further is needed at this time.

## 2018-06-09 ENCOUNTER — Telehealth: Payer: Self-pay | Admitting: Pulmonary Disease

## 2018-06-09 NOTE — Telephone Encounter (Signed)
Per a face to face conversation with Alroy Bailiff, she spoke to the pt about this today. Tammy refaxed all of the paperwork that was original faxed to [facility] to [place] on 06/07/2018. Nothing further was needed at this time.

## 2018-06-10 NOTE — Telephone Encounter (Signed)
Patient called stating social security information was not received by [facility]. Re faxed to [facility] [phone] with confirmation.

## 2018-06-14 ENCOUNTER — Telehealth: Payer: Self-pay | Admitting: Pulmonary Disease

## 2018-06-14 ENCOUNTER — Other Ambulatory Visit: Payer: Self-pay | Admitting: Pulmonary Disease

## 2018-06-14 NOTE — Telephone Encounter (Signed)
All of the pt's enrollment forms and financial forms have been faxed again to [facility] to [place]. Nothing further was needed at this time.

## 2018-06-21 ENCOUNTER — Telehealth: Payer: Self-pay | Admitting: Pulmonary Disease

## 2018-06-21 NOTE — Telephone Encounter (Signed)
Proof of income has been faxed to [facility] for [facility].  Confirmation has been received.

## 2018-06-21 NOTE — Telephone Encounter (Signed)
Spoke to patient regarding paperwork. She is going to drop off new paperwork to fax to [facility].

## 2018-06-23 ENCOUNTER — Telehealth: Payer: Self-pay | Admitting: Pulmonary Disease

## 2018-06-23 NOTE — Telephone Encounter (Signed)
Pt is calling back [phone]

## 2018-06-23 NOTE — Telephone Encounter (Signed)
Received a fax from [employer] to [employer]. Pt has been approved for patient assistance starting 06/23/2018 - 06/23/2019.  LMTCB x1 for pt.

## 2018-06-23 NOTE — Telephone Encounter (Signed)
Spoke with pt. She is aware that she has been approved for patient assistance. Pt gives this injection to herself. Medication will be shipped to her. Nothing further was needed at this time.

## 2018-07-01 ENCOUNTER — Telehealth: Payer: Self-pay

## 2018-07-01 NOTE — Telephone Encounter (Signed)
Call placed to Ms. Finklea to reschedule her 5-20 gyn onc appt due to Dr. Fransisca Connors being in the OR. At this time she does not feel comfortable coming out of her home due to COVID-19 and being high risk. She does not want to be rescheduled and will call when she is more comfortable to reschedule. Appt cancelled at her request. She denies any issues.

## 2018-07-06 ENCOUNTER — Ambulatory Visit: Payer: Medicare Other

## 2018-07-07 ENCOUNTER — Telehealth: Payer: Self-pay | Admitting: Pulmonary Disease

## 2018-07-07 NOTE — Telephone Encounter (Signed)
This is in regards to nucala.  Routing to [employer] and [employer].

## 2018-07-07 NOTE — Telephone Encounter (Signed)
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

## 2018-07-08 NOTE — Telephone Encounter (Addendum)
I called Anita Kent back and lmom . I told him if he had any more questions or concerns to call and ask for me. In case this gets routed to triage please refer to ph note 06/23/2018 for updates. Pt was approved for co-pay asst.. Hopefully he won't have to call back. I'm leaving encounter open in case he does cb.Marland Kitchen

## 2018-07-12 ENCOUNTER — Telehealth: Payer: Self-pay | Admitting: Pulmonary Disease

## 2018-07-12 NOTE — Telephone Encounter (Signed)
Will route to [place] office.

## 2018-07-12 NOTE — Telephone Encounter (Signed)
Forms have been faxed to [facility] to [place]. Will follow up.

## 2018-07-12 NOTE — Telephone Encounter (Signed)
Spoke with pt. States that she received her medication in the mail and was given the prefilled syringe instead of the auto injector. Pt would like to have this switched back. Called [employer] Patient [employer] at [phone] to see if I could get this switched. Spoke with [employer]. A new enrollment form will need to be filled out with the auto injector chosen on the form.

## 2018-07-13 NOTE — Telephone Encounter (Signed)
Alex called back today. I didn't get a chance to call him back. I was with a pt at the time. I'll call him 07/14/2018. Nothing further needed.

## 2018-07-19 ENCOUNTER — Telehealth: Payer: Self-pay | Admitting: Pulmonary Disease

## 2018-07-19 MED ORDER — PREDNISONE 20 MG PO TABS: 40.0000 mg | ORAL_TABLET | Freq: Every day | ORAL | 0 refills | Status: DC

## 2018-07-19 MED ORDER — PREDNISONE 20 MG PO TABS: 40.0000 mg | ORAL_TABLET | Freq: Every day | ORAL | 0 refills | Status: AC

## 2018-07-19 NOTE — Telephone Encounter (Signed)
Pt is requesting that Rx for prednisone be sent to [facility] vs [facility]. Rx has been sent to preferred pharmacy. I have contacted [facility] and requested that Rx for prednisone be canceled.  Pt is aware and voiced her understanding.  Nothing further is needed.

## 2018-07-19 NOTE — Telephone Encounter (Signed)
Called and spoke to pt.  Pt reports of wheezing, chest congestion, prod cough with yellowish mucus, body aches & increased weakness and fatigue x2wk. Pt had last nucala injection two weeks ago, and does not feel that this injection was effective.  Sob is baseline.  Denies fever, chills or sweats. Pt is using albuterol nebulizer TID and Wizela BID with mild relief in symptoms.  Pt is requesting Rx for prednisone, as this has helped previously.  DS please advise. Thanks

## 2018-07-19 NOTE — Telephone Encounter (Signed)
Pt is aware of below message/recommendations and voiced her understanding. °Nothing further is needed.  °

## 2018-07-19 NOTE — Addendum Note (Signed)
Addended by: Maryanna Shape A on: 07/19/2018 01:30 PM   Modules accepted: Orders

## 2018-07-19 NOTE — Telephone Encounter (Signed)
Pred 40 mg daily (2 x 20 mg) X 5 days ordered  Thanks Waunita Schooner

## 2018-08-25 ENCOUNTER — Inpatient Hospital Stay
Admission: EM | Admit: 2018-08-25 | Discharge: 2018-08-26 | DRG: 948 | Disposition: A | Discharge status: Home / Self Care | Payer: Medicare Other | Attending: Internal Medicine | Admitting: Internal Medicine

## 2018-08-25 ENCOUNTER — Other Ambulatory Visit: Payer: Self-pay

## 2018-08-25 ENCOUNTER — Emergency Department: Payer: Medicare Other

## 2018-08-25 DIAGNOSIS — Z880 Allergy status to penicillin: Secondary | ICD-10-CM

## 2018-08-25 DIAGNOSIS — J439 Emphysema, unspecified: Secondary | ICD-10-CM | POA: Diagnosis present

## 2018-08-25 DIAGNOSIS — G8929 Other chronic pain: Secondary | ICD-10-CM | POA: Diagnosis not present

## 2018-08-25 DIAGNOSIS — Z881 Allergy status to other antibiotic agents status: Secondary | ICD-10-CM | POA: Diagnosis not present

## 2018-08-25 DIAGNOSIS — Z66 Do not resuscitate: Secondary | ICD-10-CM | POA: Diagnosis not present

## 2018-08-25 DIAGNOSIS — J82 Pulmonary eosinophilia, not elsewhere classified: Secondary | ICD-10-CM | POA: Diagnosis not present

## 2018-08-25 DIAGNOSIS — Z820 Family history of epilepsy and other diseases of the nervous system: Secondary | ICD-10-CM | POA: Diagnosis not present

## 2018-08-25 DIAGNOSIS — Z823 Family history of stroke: Secondary | ICD-10-CM | POA: Diagnosis not present

## 2018-08-25 DIAGNOSIS — Z8249 Family history of ischemic heart disease and other diseases of the circulatory system: Secondary | ICD-10-CM | POA: Diagnosis not present

## 2018-08-25 DIAGNOSIS — Z825 Family history of asthma and other chronic lower respiratory diseases: Secondary | ICD-10-CM

## 2018-08-25 DIAGNOSIS — R7989 Other specified abnormal findings of blood chemistry: Secondary | ICD-10-CM | POA: Diagnosis not present

## 2018-08-25 DIAGNOSIS — I10 Essential (primary) hypertension: Secondary | ICD-10-CM | POA: Diagnosis present

## 2018-08-25 DIAGNOSIS — Z885 Allergy status to narcotic agent status: Secondary | ICD-10-CM | POA: Diagnosis not present

## 2018-08-25 DIAGNOSIS — E785 Hyperlipidemia, unspecified: Secondary | ICD-10-CM | POA: Diagnosis not present

## 2018-08-25 DIAGNOSIS — Z20828 Contact with and (suspected) exposure to other viral communicable diseases: Secondary | ICD-10-CM | POA: Diagnosis not present

## 2018-08-25 DIAGNOSIS — Z79899 Other long term (current) drug therapy: Secondary | ICD-10-CM | POA: Diagnosis not present

## 2018-08-25 DIAGNOSIS — R079 Chest pain, unspecified: Secondary | ICD-10-CM | POA: Diagnosis present

## 2018-08-25 DIAGNOSIS — R002 Palpitations: Secondary | ICD-10-CM | POA: Diagnosis present

## 2018-08-25 DIAGNOSIS — E039 Hypothyroidism, unspecified: Secondary | ICD-10-CM | POA: Diagnosis present

## 2018-08-25 DIAGNOSIS — Z886 Allergy status to analgesic agent status: Secondary | ICD-10-CM

## 2018-08-25 DIAGNOSIS — M546 Pain in thoracic spine: Secondary | ICD-10-CM | POA: Diagnosis not present

## 2018-08-25 DIAGNOSIS — Z79890 Hormone replacement therapy (postmenopausal): Secondary | ICD-10-CM | POA: Diagnosis not present

## 2018-08-25 DIAGNOSIS — I447 Left bundle-branch block, unspecified: Secondary | ICD-10-CM | POA: Diagnosis present

## 2018-08-25 DIAGNOSIS — Z882 Allergy status to sulfonamides status: Secondary | ICD-10-CM | POA: Diagnosis not present

## 2018-08-25 LAB — CBC WITH DIFFERENTIAL/PLATELET
Abs Immature Granulocytes: 0.02 10*3/uL (ref 0.00–0.07)
Basophils Absolute: 0 10*3/uL (ref 0.0–0.1)
Basophils Relative: 1 %
Eosinophils Absolute: 0 10*3/uL (ref 0.0–0.5)
Eosinophils Relative: 0 %
HCT: 34 % — ABNORMAL LOW (ref 36.0–46.0)
Hemoglobin: 10.7 g/dL — ABNORMAL LOW (ref 12.0–15.0)
Immature Granulocytes: 0 %
Lymphocytes Relative: 12 %
Lymphs Abs: 0.6 10*3/uL — ABNORMAL LOW (ref 0.7–4.0)
MCH: 26.8 pg (ref 26.0–34.0)
MCHC: 31.5 g/dL (ref 30.0–36.0)
MCV: 85 fL (ref 80.0–100.0)
Monocytes Absolute: 0.4 10*3/uL (ref 0.1–1.0)
Monocytes Relative: 9 %
Neutro Abs: 3.7 10*3/uL (ref 1.7–7.7)
Neutrophils Relative %: 78 %
Platelets: 388 10*3/uL (ref 150–400)
RBC: 4 MIL/uL (ref 3.87–5.11)
RDW: 15.9 % — ABNORMAL HIGH (ref 11.5–15.5)
WBC: 4.8 10*3/uL (ref 4.0–10.5)
nRBC: 0 % (ref 0.0–0.2)

## 2018-08-25 LAB — TROPONIN I (HIGH SENSITIVITY): Troponin I (High Sensitivity): 392 ng/L (ref <18)

## 2018-08-25 LAB — COMPREHENSIVE METABOLIC PANEL
ALT: 16 U/L (ref 0–44)
AST: 21 U/L (ref 15–41)
Albumin: 3.6 g/dL (ref 3.5–5.0)
Alkaline Phosphatase: 80 U/L (ref 38–126)
Anion gap: 8 (ref 5–15)
BUN: 13 mg/dL (ref 8–23)
CO2: 20 mmol/L — ABNORMAL LOW (ref 22–32)
Calcium: 9.2 mg/dL (ref 8.9–10.3)
Chloride: 109 mmol/L (ref 98–111)
Creatinine, Ser: 0.81 mg/dL (ref 0.44–1.00)
GFR calc Af Amer: >60 mL/min (ref >60)
GFR calc non Af Amer: >60 mL/min (ref >60)
Glucose, Bld: 94 mg/dL (ref 70–99)
Potassium: 4 mmol/L (ref 3.5–5.1)
Sodium: 137 mmol/L (ref 135–145)
Total Bilirubin: 0.3 mg/dL (ref 0.3–1.2)
Total Protein: 6.3 g/dL — ABNORMAL LOW (ref 6.5–8.1)

## 2018-08-25 LAB — MAGNESIUM: Magnesium: 2 mg/dL (ref 1.7–2.4)

## 2018-08-25 LAB — APTT: aPTT: >160 seconds (ref 24–36)

## 2018-08-25 LAB — LIPASE, BLOOD: Lipase: 47 U/L (ref 11–51)

## 2018-08-25 LAB — PROTIME-INR
INR: 1.1 (ref 0.8–1.2)
Prothrombin Time: 14.1 seconds (ref 11.4–15.2)

## 2018-08-25 LAB — SARS CORONAVIRUS 2 BY RT PCR (HOSPITAL ORDER, PERFORMED IN ~~LOC~~ HOSPITAL LAB): SARS Coronavirus 2: NEGATIVE

## 2018-08-25 LAB — TSH: TSH: 0.744 u[IU]/mL (ref 0.350–4.500)

## 2018-08-25 MED ORDER — ONDANSETRON HCL 4 MG PO TABS: 4.0000 mg | ORAL_TABLET | Freq: Four times a day (QID) | ORAL | Status: DC | PRN

## 2018-08-25 MED ORDER — LOSARTAN POTASSIUM 50 MG PO TABS
50.0000 mg | ORAL_TABLET | Freq: Every day | ORAL | Status: DC
  Administered 2018-08-26: 50 mg via ORAL
  Filled 2018-08-25: qty 1

## 2018-08-25 MED ORDER — CLOPIDOGREL BISULFATE 75 MG PO TABS
75.0000 mg | ORAL_TABLET | Freq: Every day | ORAL | Status: DC
  Administered 2018-08-26: 75 mg via ORAL
  Filled 2018-08-25: qty 1

## 2018-08-25 MED ORDER — ATORVASTATIN CALCIUM 20 MG PO TABS: 40.0000 mg | ORAL_TABLET | Freq: Every day | ORAL | Status: DC

## 2018-08-25 MED ORDER — MOMETASONE FURO-FORMOTEROL FUM 200-5 MCG/ACT IN AERO
2.0000 | INHALATION_SPRAY | Freq: Two times a day (BID) | RESPIRATORY_TRACT | Status: DC
  Administered 2018-08-25 – 2018-08-26 (×2): 2 via RESPIRATORY_TRACT
  Filled 2018-08-25: qty 8.8

## 2018-08-25 MED ORDER — MONTELUKAST SODIUM 10 MG PO TABS: 10.0000 mg | ORAL_TABLET | Freq: Every day | ORAL | Status: DC

## 2018-08-25 MED ORDER — CLOPIDOGREL BISULFATE 75 MG PO TABS
300.0000 mg | ORAL_TABLET | Freq: Once | ORAL | Status: AC
  Administered 2018-08-25: 300 mg via ORAL
  Filled 2018-08-25: qty 4

## 2018-08-25 MED ORDER — RISAQUAD PO CAPS
1.0000 | ORAL_CAPSULE | Freq: Every day | ORAL | Status: DC
  Administered 2018-08-26: 1 via ORAL
  Filled 2018-08-25: qty 1

## 2018-08-25 MED ORDER — ACETAMINOPHEN 325 MG PO TABS: 650.0000 mg | ORAL_TABLET | Freq: Four times a day (QID) | ORAL | Status: DC | PRN

## 2018-08-25 MED ORDER — LORATADINE 10 MG PO TABS
10.0000 mg | ORAL_TABLET | Freq: Every day | ORAL | Status: DC
  Administered 2018-08-25: 10 mg via ORAL
  Filled 2018-08-25: qty 1

## 2018-08-25 MED ORDER — BISOPROLOL FUMARATE 5 MG PO TABS
5.0000 mg | ORAL_TABLET | Freq: Every day | ORAL | Status: DC
  Administered 2018-08-26 (×2): 5 mg via ORAL
  Filled 2018-08-25 (×2): qty 1

## 2018-08-25 MED ORDER — CLOPIDOGREL BISULFATE 75 MG PO TABS: 75.0000 mg | ORAL_TABLET | Freq: Every day | ORAL | 0 refills | Status: AC

## 2018-08-25 MED ORDER — LORATADINE 10 MG PO TABS: 10.0000 mg | ORAL_TABLET | Freq: Every day | ORAL | Status: DC

## 2018-08-25 MED ORDER — ACETAMINOPHEN 650 MG RE SUPP: 650.0000 mg | Freq: Four times a day (QID) | RECTAL | Status: DC | PRN

## 2018-08-25 MED ORDER — ONDANSETRON HCL 4 MG/2ML IJ SOLN: 4.0000 mg | Freq: Four times a day (QID) | INTRAMUSCULAR | Status: DC | PRN

## 2018-08-25 MED ORDER — FUROSEMIDE 10 MG/ML IJ SOLN
20.0000 mg | Freq: Once | INTRAMUSCULAR | Status: AC
  Administered 2018-08-25: 20 mg via INTRAVENOUS
  Filled 2018-08-25: qty 2

## 2018-08-25 MED ORDER — ALBUTEROL SULFATE (2.5 MG/3ML) 0.083% IN NEBU: 2.5000 mg | INHALATION_SOLUTION | Freq: Four times a day (QID) | RESPIRATORY_TRACT | Status: DC | PRN

## 2018-08-25 MED ORDER — HEPARIN (PORCINE) 25000 UT/250ML-% IV SOLN
900.0000 [IU]/h | INTRAVENOUS | Status: DC
  Administered 2018-08-25: 800 [IU]/h via INTRAVENOUS
  Filled 2018-08-25: qty 250

## 2018-08-25 MED ORDER — LEVOTHYROXINE SODIUM 50 MCG PO TABS
75.0000 ug | ORAL_TABLET | Freq: Every day | ORAL | Status: DC
  Filled 2018-08-25: qty 1

## 2018-08-25 MED ORDER — HEPARIN BOLUS VIA INFUSION
3400.0000 [IU] | Freq: Once | INTRAVENOUS | Status: AC
  Administered 2018-08-25: 3400 [IU] via INTRAVENOUS
  Filled 2018-08-25: qty 3400

## 2018-08-25 MED ORDER — PANTOPRAZOLE SODIUM 40 MG PO TBEC: 80.0000 mg | DELAYED_RELEASE_TABLET | Freq: Every day | ORAL | Status: DC

## 2018-08-25 MED ORDER — NITROGLYCERIN 0.4 MG SL SUBL
0.4000 mg | SUBLINGUAL_TABLET | SUBLINGUAL | Status: DC | PRN
  Administered 2018-08-25: 0.4 mg via SUBLINGUAL
  Filled 2018-08-25 (×2): qty 1

## 2018-08-25 MED ORDER — OMEPRAZOLE MAGNESIUM 20 MG PO TBEC: 40.0000 mg | DELAYED_RELEASE_TABLET | Freq: Every day | ORAL | Status: DC

## 2018-08-25 NOTE — H&P (Addendum)
[place] [facility] [place] NAME: Anita Kent    MR#:  [id number]  DATE OF BIRTH:  November 25, 1940  DATE OF ADMISSION:  08/25/2018  PRIMARY CARE PHYSICIAN: Marinda Elk, MD   REQUESTING/REFERRING PHYSICIAN: Dr Duffy Bruce  CHIEF COMPLAINT:   Chief Complaint  Patient presents with  . Chest Pain  . Palpitations    HISTORY OF PRESENT ILLNESS:  Anita Kent  is a 78 y.o. female with a known history of eosinophilic asthma.  She felt different in her chest today.  She does not describe the pain as severe but does describe a tightness.  She took her pulse and her pulse was fast.  Her blood pressure was normal.  She has been having problems with her blood pressure being low.  She was recently taken off her hydrochlorothiazide.  She also felt some tightness in her shoulder blades when her blood pressure is low.  Of note 2 days ago her blood pressure was low and she was clammy and sweaty and they called EMT and they checked her out.  She called her medical doctor and they took her off the hydrochlorothiazide.  In the ER her troponin was rising and hospitalist services were contacted for further evaluation.  PAST MEDICAL HISTORY:   Past Medical History:  Diagnosis Date  . Asthma   . COPD (chronic obstructive pulmonary disease) ([place])   . Cough variant asthma   . Diabetes ([place])    states was told she was not diabetic tested high once on hemoglobin A1C  . Dyslipidemia   . Emphysema of lung ([place])   . endometrial cancer 08/2016   Total Hysterectomy 09/04/2016  . HTN (hypertension)   . Hypertension   . Hypothyroidism   . Thyroid disease     PAST SURGICAL HISTORY:   Past Surgical History:  Procedure Laterality Date  . ABDOMINAL HYSTERECTOMY    . CATARACT EXTRACTION, BILATERAL    . CYSTOSCOPY N/A 09/04/2016   Procedure: CYSTOSCOPY;  Surgeon: Ward, Honor Loh, MD;  Location: [facility] ORS;  Service: Gynecology;  Laterality: N/A;  .  DILATATION & CURETTAGE/HYSTEROSCOPY WITH MYOSURE N/A 09/04/2016   Procedure: DILATATION & CURETTAGE/HYSTEROSCOPY WITH MYOSURE;  Surgeon: Ward, Honor Loh, MD;  Location: [facility] ORS;  Service: Gynecology;  Laterality: N/A;  . GANGLION CYST EXCISION Left    hand  . LAPAROSCOPIC BILATERAL SALPINGO OOPHERECTOMY Bilateral 09/04/2016   Procedure: LAPAROSCOPIC BILATERAL SALPINGO OOPHORECTOMY;  Surgeon: Ward, Honor Loh, MD;  Location: [facility] ORS;  Service: Gynecology;  Laterality: Bilateral;  . LAPAROSCOPIC HYSTERECTOMY  09/04/2016   Procedure: HYSTERECTOMY TOTAL LAPAROSCOPIC;  Surgeon: Ward, Honor Loh, MD;  Location: [facility] ORS;  Service: Gynecology;;  . primary open reduction procedure Right 11/09/2013    right ankle  . TONSILLECTOMY    . TUBAL LIGATION      SOCIAL HISTORY:   Social History   Tobacco Use  . Smoking status: Never Smoker  . Smokeless tobacco: Never Used  Substance Use Topics  . Alcohol use: No    Alcohol/week: 0.0 standard drinks    FAMILY HISTORY:   Family History  Problem Relation Age of Onset  . Myasthenia gravis Daughter   . Emphysema Mother   . COPD Mother   . Dementia Mother   . Stroke Father   . Heart attack Sister     DRUG ALLERGIES:   Allergies  Allergen Reactions  . Oxycodone Itching  . Sulfa Antibiotics Hives  . Amoxicillin Hives and Rash  Has patient had a PCN reaction causing immediate rash, facial/tongue/throat swelling, SOB or lightheadedness with hypotension: Yes Has patient had a PCN reaction causing severe rash involving mucus membranes or skin necrosis: Yes Has patient had a PCN reaction that required hospitalization: No Has patient had a PCN reaction occurring within the last 10 years: Yes If all of the above answers are "NO", then may proceed with Cephalosporin use.   Diona Fanti [Aspirin] Hives  . Levaquin [Levofloxacin In D5w] Hives    REVIEW OF SYSTEMS:  CONSTITUTIONAL: No fever, positive clammy sweaty feeling.  Positive for fatigue.  EYES:  No blurred or double vision.  Wears glasses. EARS, NOSE, AND THROAT: No tinnitus or ear pain. No sore throat.  Some congestion in the nose RESPIRATORY: Positive cough, and shortness of breath.  No wheezing or hemoptysis.  CARDIOVASCULAR: Positive chest pain, no orthopnea, edema.  GASTROINTESTINAL: No nausea, vomiting, diarrhea or abdominal pain. No blood in bowel movements GENITOURINARY: No dysuria, hematuria.  ENDOCRINE: No polyuria, nocturia,  HEMATOLOGY: No anemia, easy bruising or bleeding SKIN: No rash or lesion. MUSCULOSKELETAL: No joint pain or arthritis.   NEUROLOGIC: No tingling, numbness, weakness.  PSYCHIATRY: No anxiety or depression.   MEDICATIONS AT HOME:   Prior to Admission medications   Medication Sig Start Date End Date Taking? Authorizing Provider  levothyroxine (SYNTHROID, LEVOTHROID) 75 MCG tablet Take 75 mcg by mouth daily before breakfast.   Yes [provider]  losartan (COZAAR) 50 MG tablet Take 50 mg by mouth daily.   Yes [provider]  megestrol (MEGACE) 40 MG tablet Take 1 tablet (40 mg total) by mouth 2 (two) times daily. 12/16/16  Yes Mellody Drown, MD  montelukast (SINGULAIR) 10 MG tablet Take 1 tablet (10 mg total) by mouth at bedtime. 03/17/18 03/17/19 Yes Wilhelmina Mcardle, MD  omeprazole (PRILOSEC OTC) 20 MG tablet Take 2 tablets (40 mg total) by mouth at bedtime. 11/01/17  Yes Wilhelmina Mcardle, MD  Probiotic CAPS Take 1 capsule by mouth daily.   Yes [provider]  Grant Ruts INHUB 250-50 MCG/DOSE AEPB INHALE 1 DOSE BY MOUTH TWICE DAILY 06/14/18  Yes Wilhelmina Mcardle, MD  albuterol (PROVENTIL) (2.5 MG/3ML) 0.083% nebulizer solution Take 3 mLs (2.5 mg total) by nebulization every 6 (six) hours as needed for wheezing or shortness of breath. 01/25/18   Wilhelmina Mcardle, MD  Albuterol Sulfate (PROAIR RESPICLICK) 425 (90 Base) MCG/ACT AEPB Inhale 2 puffs into the lungs every 6 (six) hours as needed. 11/23/17   Wilhelmina Mcardle, MD   clopidogrel (PLAVIX) 75 MG tablet Take 1 tablet (75 mg total) by mouth daily for 20 days. 08/25/18 09/14/18  Carrie Mew, MD  ibuprofen (ADVIL,MOTRIN) 200 MG tablet Take 400 mg by mouth as needed for headache or moderate pain.     [provider]  loratadine (CLARITIN) 10 MG tablet Take 10 mg by mouth daily.    [provider]  NUCALA 100 MG/ML SOAJ Inject 100 mg into the muscle every 30 (thirty) days. 06/29/18   [provider]      VITAL SIGNS:  Blood pressure (!) 171/100, pulse 72, temperature 98.3 F (36.8 C), temperature source Oral, resp. rate 16, height 5' (1.524 m), weight 68 kg, SpO2 99 %.  PHYSICAL EXAMINATION:  GENERAL:  78 y.o.-year-old patient lying in the bed with no acute distress.  EYES: Pupils equal, round, reactive to light and accommodation. No scleral icterus. Extraocular muscles intact.  HEENT: Head atraumatic, normocephalic. Oropharynx and  nasopharynx clear.  NECK:  Supple, no jugular venous distention. No thyroid enlargement, no tenderness.  LUNGS: Decreased breath sounds bilateral bases, no wheezing, rales,rhonchi or crepitation. No use of accessory muscles of respiration.  CARDIOVASCULAR: S1, S2 normal. No murmurs, rubs, or gallops.  ABDOMEN: Soft, nontender, nondistended. Bowel sounds present. No organomegaly or mass.  EXTREMITIES: No pedal edema, cyanosis, or clubbing.  NEUROLOGIC: Cranial nerves II through XII are intact. Muscle strength 5/5 in all extremities. Sensation intact. Gait not checked.  PSYCHIATRIC: The patient is alert and oriented x 3.  SKIN: No rash, lesion, or ulcer.   LABORATORY PANEL:   CBC Recent Labs  Lab 08/25/18 1424  WBC 4.8  HGB 10.7*  HCT 34.0*  PLT 388   ------------------------------------------------------------------------------------------------------------------  Chemistries  Recent Labs  Lab 08/25/18 1424  NA 137  K 4.0  CL 109  CO2 20*  GLUCOSE 94  BUN 13  CREATININE 0.81   CALCIUM 9.2  MG 2.0  AST 21  ALT 16  ALKPHOS 80  BILITOT 0.3   ------------------------------------------------------------------------------------------------------------------   RADIOLOGY:  Dg Chest Portable 1 View  Result Date: 08/25/2018 CLINICAL DATA:  Tachycardia and chest pressure for the past 5 days, worse today. EXAM: PORTABLE CHEST 1 VIEW COMPARISON:  Chest radiographs dated 11/18/2017 and chest CT dated 09/24/2016. FINDINGS: Interval borderline enlargement of the cardiac silhouette. Again demonstrated is a small to moderate-sized hiatal hernia. Mild increase in prominence of the pulmonary vasculature with interval minimally prominent interstitial markings. No pleural fluid seen. Unremarkable bones. IMPRESSION: 1. Interval borderline cardiomegaly and minimal changes of congestive heart failure. 2. Stable small to moderate-sized hiatal hernia. Electronically Signed   By: Claudie Revering M.D.   On: 08/25/2018 14:55    EKG:   Left bundle branch block 84 bpm  IMPRESSION AND PLAN:   1.  Chest pain with elevated troponin concerning for ACS.  Heparin drip.  Patient has an allergy to Aspirin and so Plavix was loaded in the ER and will continue Plavix on a daily basis.  Start low-dose bisoprolol beta-blocker.  Start statin and check lipid profile.  Cardiology consultation for the a.m.  Monitor on telemetry.  Obtain an echocardiogram.  Give 1 dose of Lasix. 2.  Eosinophilic asthma.  Continue nebulizer treatments. 3. Hypertension.  Start low-dose bisoprolol. 4.  Hypothyroidism unspecified continue levothyroxine   All the records are reviewed and case discussed with ED provider. Management plans discussed with the patient, and she is in agreement.  Tried to call the daughter twice  CODE STATUS: DNR  TOTAL TIME TAKING CARE OF THIS PATIENT: 50 minutes.    Loletha Grayer M.D on 08/25/2018 at 7:12 PM  Between 7am to 6pm - Pager - [phone]  After 6pm call admission pager  [phone]  [facility]  [phone]  CC: Primary care physician; Marinda Elk, MD

## 2018-08-25 NOTE — ED Triage Notes (Signed)
pt arrives via ems from home with reports of palpitations x 5 days. Pain between shoulder blades in back. HR was 125 then dropped to 88 in route. Hx of left bundle branch block. Pt describes pain in back as a tightness, pressure with aching. pt states she feels weak. NAD at this time pt a&o x 4

## 2018-08-25 NOTE — ED Provider Notes (Signed)
[facility] Emergency Department Provider Note  ____________________________________________  Time seen: Approximately 2:31 PM  I have reviewed the triage vital signs and the nursing notes.   HISTORY  Chief Complaint Chest Pain and Palpitations    HPI Anita Kent is a 78 y.o. female with a history of COPD diabetes hypertension who comes the ED complaining of palpitations, noticed on waking up this morning , constant until about 1 PM today when EMS arrived to bring her to the hospital.  She checked her heart rate and found that it was 120.  Associated with central chest tightness that is nonradiating.  No aggravating or alleviating factors.  No associated shortness of breath diaphoresis vomiting dizziness or lightheadedness.  No syncope.  Never had anything like this before.  When EMS arrived to her house, she noticed that actually the tightness had resolved.  She checked her heart rate and found that it was about 80.  She reports chronic upper back pain for months which she relates to her COPD.     Past Medical History:  Diagnosis Date  . Asthma   . COPD (chronic obstructive pulmonary disease) ([place])   . Cough variant asthma   . Diabetes ([place])    states was told she was not diabetic tested high once on hemoglobin A1C  . Dyslipidemia   . Emphysema of lung ([place])   . endometrial cancer 08/2016   Total Hysterectomy 09/04/2016  . HTN (hypertension)   . Hypertension   . Hypothyroidism   . Thyroid disease      Patient Active Problem List   Diagnosis Date Noted  . Endometrial cancer, FIGO stage IIIA ([place]) 09/16/2016  . Sinusitis, acute 08/13/2014  . Emphysema lung ([place]) 05/08/2014  . Asthma, chronic 03/06/2014  . Cough 03/06/2014     Past Surgical History:  Procedure Laterality Date  . ABDOMINAL HYSTERECTOMY    . CATARACT EXTRACTION, BILATERAL    . CYSTOSCOPY N/A 09/04/2016   Procedure: CYSTOSCOPY;  Surgeon: Ward, Honor Loh, MD;  Location: [facility]  ORS;  Service: Gynecology;  Laterality: N/A;  . DILATATION & CURETTAGE/HYSTEROSCOPY WITH MYOSURE N/A 09/04/2016   Procedure: DILATATION & CURETTAGE/HYSTEROSCOPY WITH MYOSURE;  Surgeon: Ward, Honor Loh, MD;  Location: [facility] ORS;  Service: Gynecology;  Laterality: N/A;  . GANGLION CYST EXCISION Left    hand  . LAPAROSCOPIC BILATERAL SALPINGO OOPHERECTOMY Bilateral 09/04/2016   Procedure: LAPAROSCOPIC BILATERAL SALPINGO OOPHORECTOMY;  Surgeon: Ward, Honor Loh, MD;  Location: [facility] ORS;  Service: Gynecology;  Laterality: Bilateral;  . LAPAROSCOPIC HYSTERECTOMY  09/04/2016   Procedure: HYSTERECTOMY TOTAL LAPAROSCOPIC;  Surgeon: Ward, Honor Loh, MD;  Location: [facility] ORS;  Service: Gynecology;;  . primary open reduction procedure Right 11/09/2013    right ankle  . TONSILLECTOMY    . TUBAL LIGATION       Prior to Admission medications   Medication Sig Start Date End Date Taking? Authorizing Provider  albuterol (PROVENTIL) (2.5 MG/3ML) 0.083% nebulizer solution Take 3 mLs (2.5 mg total) by nebulization every 6 (six) hours as needed for wheezing or shortness of breath. 01/25/18   Wilhelmina Mcardle, MD  Albuterol Sulfate (PROAIR RESPICLICK) 237 (90 Base) MCG/ACT AEPB Inhale 2 puffs into the lungs every 6 (six) hours as needed. 11/23/17   Wilhelmina Mcardle, MD  ibuprofen (ADVIL,MOTRIN) 200 MG tablet Take 400 mg by mouth as needed for headache or moderate pain.     [provider]  levothyroxine (SYNTHROID, LEVOTHROID) 75 MCG tablet Take 75 mcg by mouth  daily before breakfast.    [provider]  loratadine (CLARITIN) 10 MG tablet Take 10 mg by mouth daily.    [provider]  losartan-hydrochlorothiazide (HYZAAR) 50-12.5 MG per tablet Take 1 tablet by mouth daily. 02/26/14   [provider]  megestrol (MEGACE) 40 MG tablet Take 1 tablet (40 mg total) by mouth 2 (two) times daily. 12/16/16   Mellody Drown, MD  montelukast (SINGULAIR) 10 MG tablet Take 1 tablet (10 mg total)  by mouth at bedtime. 03/17/18 03/17/19  Wilhelmina Mcardle, MD  omeprazole (PRILOSEC OTC) 20 MG tablet Take 2 tablets (40 mg total) by mouth at bedtime. 11/01/17   Wilhelmina Mcardle, MD  Probiotic CAPS Take 1 capsule by mouth daily.    [provider]  Grant Ruts INHUB 250-50 MCG/DOSE AEPB INHALE 1 DOSE BY MOUTH TWICE DAILY 06/14/18   Wilhelmina Mcardle, MD     Allergies Oxycodone, Sulfa antibiotics, Amoxicillin, Asa [aspirin], and Levaquin [levofloxacin in d5w]   Family History  Problem Relation Age of Onset  . Myasthenia gravis Daughter   . Emphysema Mother   . COPD Mother   . Dementia Mother   . Stroke Father   . Heart attack Sister     Social History Social History   Tobacco Use  . Smoking status: Never Smoker  . Smokeless tobacco: Never Used  Substance Use Topics  . Alcohol use: No    Alcohol/week: 0.0 standard drinks  . Drug use: No    Review of Systems  Constitutional:   No fever or chills.  ENT:   No sore throat. No rhinorrhea. Cardiovascular: Positive chest tightness as above without syncope. Respiratory:   Chronic baseline dyspnea, without cough. Gastrointestinal:   Negative for abdominal pain, vomiting and diarrhea.  Musculoskeletal:   Negative for focal pain or swelling All other systems reviewed and are negative except as documented above in ROS and HPI.  ____________________________________________   PHYSICAL EXAM:  VITAL SIGNS: ED Triage Vitals  Enc Vitals Group     BP 08/25/18 1419 (!) 146/65     Pulse Rate 08/25/18 1419 82     Resp 08/25/18 1419 19     Temp 08/25/18 1419 98.3 F (36.8 C)     Temp Source 08/25/18 1419 Oral     SpO2 08/25/18 1419 99 %     Weight 08/25/18 1427 150 lb (68 kg)     Height 08/25/18 1427 5' (1.524 m)     Head Circumference --      Peak Flow --      Pain Score 08/25/18 1427 0     Pain Loc --      Pain Edu? --      Excl. in [place]? --     Vital signs reviewed, nursing assessments reviewed.   Constitutional:    Alert and oriented. Non-toxic appearance. Eyes:   Conjunctivae are normal. EOMI. PERRL. ENT      Head:   Normocephalic and atraumatic.      Nose:   No congestion/rhinnorhea.       Mouth/Throat:   MMM, no pharyngeal erythema. No peritonsillar mass.       Neck:   No meningismus. Full ROM. Hematological/Lymphatic/Immunilogical:   No cervical lymphadenopathy. Cardiovascular:   RRR. Symmetric bilateral radial and DP pulses.  No murmurs. Cap refill less than 2 seconds. Respiratory:   Normal respiratory effort without tachypnea/retractions. Breath sounds are clear and equal bilaterally. No wheezes/rales/rhonchi. Gastrointestinal:   Soft and nontender. Non distended.  There is no CVA tenderness.  No rebound, rigidity, or guarding. Musculoskeletal:   Normal range of motion in all extremities. No joint effusions.  No lower extremity tenderness.  No edema. Neurologic:   Normal speech and language.  Motor grossly intact. No acute focal neurologic deficits are appreciated.  Skin:    Skin is warm, dry and intact. No rash noted.  No petechiae, purpura, or bullae.  ____________________________________________    LABS (pertinent positives/negatives) (all labs ordered are listed, but only abnormal results are displayed) Labs Reviewed  COMPREHENSIVE METABOLIC PANEL  LIPASE, BLOOD  TROPONIN I (HIGH SENSITIVITY)  TROPONIN I (HIGH SENSITIVITY)  CBC WITH DIFFERENTIAL/PLATELET  MAGNESIUM  TSH   ____________________________________________   EKG  Interpreted by me Sinus rhythm rate of 84, normal axis.  Left bundle branch block.  No acute ischemic changes.  ____________________________________________    RADIOLOGY  No results found.  ____________________________________________   PROCEDURES Procedures  ____________________________________________  DIFFERENTIAL DIAGNOSIS   Electrolyte abnormality, non-STEMI, paroxysmal atrial fibrillation.  Doubt dissection pneumothorax pericarditis  pulmonary embolism or pneumonia.  CLINICAL IMPRESSION / ASSESSMENT AND PLAN / ED COURSE  Medications ordered in the ED: Medications - No data to display  Pertinent labs & imaging results that were available during my care of the patient were reviewed by me and considered in my medical decision making (see chart for details).  MAKELL DROHAN was evaluated in Emergency Department on 08/25/2018 for the symptoms described in the history of present illness. She was evaluated in the context of the global COVID-19 pandemic, which necessitated consideration that the patient might be at risk for infection with the SARS-CoV-2 virus that causes COVID-19. Institutional protocols and algorithms that pertain to the evaluation of patients at risk for COVID-19 are in a state of rapid change based on information released by regulatory bodies including the CDC and federal and state organizations. These policies and algorithms were followed during the patient's care in the ED.   Patient presents with atypical central chest pain, associated with a fast heart rate that she measured at home.  Resolved prior to arrival in the ED.  She now feels back to baseline and symptoms have completely resolved.  Vital signs unremarkable.  EKG nonischemic.  With her age and comorbidities as CAD risk factors, she will need serial troponins, chest x-ray.  If work-up is unremarkable, patient can be discharged home to follow-up with cardiology.  Doubt PE.  Patient's not septic.  Will be signed out to oncoming physician at 3:00 PM to follow-up on results including delta troponin.      ____________________________________________   FINAL CLINICAL IMPRESSION(S) / ED DIAGNOSES    Final diagnoses:  Palpitations     ED Discharge Orders    None      Portions of this note were generated with dragon dictation software. Dictation errors may occur despite best attempts at proofreading.   Carrie Mew, MD 08/25/18 1436

## 2018-08-25 NOTE — ED Notes (Signed)
ED TO INPATIENT HANDOFF REPORT  ED Nurse Name and Phone #:    S Name/Age/Gender Anita Kent 78 y.o. female Room/Bed: ED01A/ED01A  Code Status   Code Status: DNR  Home/SNF/Other Home Patient oriented to: self, place, time and situation Is this baseline? Yes   Triage Complete: Triage complete  Chief Complaint heart palpatations  Triage Note pt arrives via ems from home with reports of palpitations x 5 days. Pain between shoulder blades in back. HR was 125 then dropped to 88 in route. Hx of left bundle branch block. Pt describes pain in back as a tightness, pressure with aching. pt states she feels weak. NAD at this time pt a&o x 4    Allergies Allergies  Allergen Reactions  . Oxycodone Itching  . Sulfa Antibiotics Hives  . Amoxicillin Hives and Rash    Has patient had a PCN reaction causing immediate rash, facial/tongue/throat swelling, SOB or lightheadedness with hypotension: Yes Has patient had a PCN reaction causing severe rash involving mucus membranes or skin necrosis: Yes Has patient had a PCN reaction that required hospitalization: No Has patient had a PCN reaction occurring within the last 10 years: Yes If all of the above answers are "NO", then may proceed with Cephalosporin use.   Diona Fanti [Aspirin] Hives  . Levaquin [Levofloxacin In D5w] Hives    Level of Care/Admitting Diagnosis ED Disposition    ED Disposition Condition [facility] Area: [place] [[id number]]  Level of Care: Telemetry [5]  Covid Evaluation: Asymptomatic Screening Protocol (No Symptoms)  Diagnosis: Chest pain [633354]  Admitting Physician: Loletha Grayer [[id number]]  Attending Physician: Loletha Grayer [phone]  Estimated length of stay: past midnight tomorrow  Certification:: I certify this patient will need inpatient services for at least 2 midnights  PT Class (Do Not Modify): Inpatient [101]  PT Acc Code (Do Not Modify): Private [1]        B Medical/Surgery History Past Medical History:  Diagnosis Date  . Asthma   . COPD (chronic obstructive pulmonary disease) ([place])   . Cough variant asthma   . Diabetes ([place])    states was told she was not diabetic tested high once on hemoglobin A1C  . Dyslipidemia   . Emphysema of lung ([place])   . endometrial cancer 08/2016   Total Hysterectomy 09/04/2016  . HTN (hypertension)   . Hypertension   . Hypothyroidism   . Thyroid disease    Past Surgical History:  Procedure Laterality Date  . ABDOMINAL HYSTERECTOMY    . CATARACT EXTRACTION, BILATERAL    . CYSTOSCOPY N/A 09/04/2016   Procedure: CYSTOSCOPY;  Surgeon: Ward, Honor Loh, MD;  Location: [facility] ORS;  Service: Gynecology;  Laterality: N/A;  . DILATATION & CURETTAGE/HYSTEROSCOPY WITH MYOSURE N/A 09/04/2016   Procedure: DILATATION & CURETTAGE/HYSTEROSCOPY WITH MYOSURE;  Surgeon: Ward, Honor Loh, MD;  Location: [facility] ORS;  Service: Gynecology;  Laterality: N/A;  . GANGLION CYST EXCISION Left    hand  . LAPAROSCOPIC BILATERAL SALPINGO OOPHERECTOMY Bilateral 09/04/2016   Procedure: LAPAROSCOPIC BILATERAL SALPINGO OOPHORECTOMY;  Surgeon: Ward, Honor Loh, MD;  Location: [facility] ORS;  Service: Gynecology;  Laterality: Bilateral;  . LAPAROSCOPIC HYSTERECTOMY  09/04/2016   Procedure: HYSTERECTOMY TOTAL LAPAROSCOPIC;  Surgeon: Ward, Honor Loh, MD;  Location: [facility] ORS;  Service: Gynecology;;  . primary open reduction procedure Right 11/09/2013    right ankle  . TONSILLECTOMY    . TUBAL LIGATION       A IV Location/Drains/Wounds Patient Lines/Drains/Airways  Status   Active Line/Drains/Airways    Name:   Placement date:   Placement time:   Site:   Days:   Peripheral IV 08/25/18 Left Hand   08/25/18    1438    Hand   less than 1   Incision (Closed) 09/04/16 Vagina   09/04/16    0851     720   Incision - 3 Ports Abdomen 1: Right 2: Left 3: Umbilicus   93/57/01    7793     720          Intake/Output Last 24 hours No intake or output data  in the 24 hours ending 08/25/18 2108  Labs/Imaging Results for orders placed or performed during the hospital encounter of 08/25/18 (from the past 48 hour(s))  Comprehensive metabolic panel     Status: Abnormal   Collection Time: 08/25/18  2:24 PM  Result Value Ref Range   Sodium 137 135 - 145 mmol/L   Potassium 4.0 3.5 - 5.1 mmol/L   Chloride 109 98 - 111 mmol/L   CO2 20 (L) 22 - 32 mmol/L   Glucose, Bld 94 70 - 99 mg/dL   BUN 13 8 - 23 mg/dL   Creatinine, Ser 0.81 0.44 - 1.00 mg/dL   Calcium 9.2 8.9 - 10.3 mg/dL   Total Protein 6.3 (L) 6.5 - 8.1 g/dL   Albumin 3.6 3.5 - 5.0 g/dL   AST 21 15 - 41 U/L   ALT 16 0 - 44 U/L   Alkaline Phosphatase 80 38 - 126 U/L   Total Bilirubin 0.3 0.3 - 1.2 mg/dL   GFR calc non Af Amer >60 >60 mL/min   GFR calc Af Amer >60 >60 mL/min   Anion gap 8 5 - 15    Comment: Performed at [facility], [address] [place], [place] [address]  Lipase, blood     Status: None   Collection Time: 08/25/18  2:24 PM  Result Value Ref Range   Lipase 47 11 - 51 U/L    Comment: Performed at [facility], [address] [place], [place] [address]  Troponin I (High Sensitivity)     Status: Abnormal   Collection Time: 08/25/18  2:24 PM  Result Value Ref Range   Troponin I (High Sensitivity) 392 (HH) <18 ng/L    Comment: CRITICAL RESULT CALLED TO, READ BACK BY AND VERIFIED WITH Denasia Venn PEREZ @1545  08/25/18 MJU (NOTE) Elevated high sensitivity troponin I (hsTnI) values and significant  changes across serial measurements may suggest ACS but many other  chronic and acute conditions are known to elevate hsTnI results.  Refer to the "Links" section for chest pain algorithms and additional  guidance. Performed at [facility], [place], [place] [address]   Troponin I (High Sensitivity)     Status: Abnormal   Collection Time: 08/25/18  2:24 PM  Result Value Ref Range   Troponin I (High Sensitivity) 517 (HH) <18 ng/L     Comment: CRITICAL VALUE NOTED. VALUE IS CONSISTENT WITH PREVIOUSLY REPORTED/CALLED VALUE MJU (NOTE) Elevated high sensitivity troponin I (hsTnI) values and significant  changes across serial measurements may suggest ACS but many other  chronic and acute conditions are known to elevate hsTnI results.  Refer to the "Links" section for chest pain algorithms and additional  guidance. Performed at [facility], [address] [place], [place] [address]   CBC with Differential     Status: Abnormal   Collection Time:  08/25/18  2:24 PM  Result Value Ref Range   WBC 4.8 4.0 - 10.5 K/uL   RBC 4.00 3.87 - 5.11 MIL/uL   Hemoglobin 10.7 (L) 12.0 - 15.0 g/dL   HCT 34.0 (L) 36.0 - 46.0 %   MCV 85.0 80.0 - 100.0 fL   MCH 26.8 26.0 - 34.0 pg   MCHC 31.5 30.0 - 36.0 g/dL   RDW 15.9 (H) 11.5 - 15.5 %   Platelets 388 150 - 400 K/uL   nRBC 0.0 0.0 - 0.2 %   Neutrophils Relative % 78 %   Neutro Abs 3.7 1.7 - 7.7 K/uL   Lymphocytes Relative 12 %   Lymphs Abs 0.6 (L) 0.7 - 4.0 K/uL   Monocytes Relative 9 %   Monocytes Absolute 0.4 0.1 - 1.0 K/uL   Eosinophils Relative 0 %   Eosinophils Absolute 0.0 0.0 - 0.5 K/uL   Basophils Relative 1 %   Basophils Absolute 0.0 0.0 - 0.1 K/uL   Immature Granulocytes 0 %   Abs Immature Granulocytes 0.02 0.00 - 0.07 K/uL    Comment: Performed at [facility], [address] [place], [place] [address]  Magnesium     Status: None   Collection Time: 08/25/18  2:24 PM  Result Value Ref Range   Magnesium 2.0 1.7 - 2.4 mg/dL    Comment: Performed at [facility], [address] [place], [place] [address]  TSH     Status: None   Collection Time: 08/25/18  2:24 PM  Result Value Ref Range   TSH 0.744 0.350 - 4.500 uIU/mL    Comment: Performed by a 3rd Generation assay with a functional sensitivity of <=0.01 uIU/mL. Performed at [facility], [address] [place], [place] [address]   SARS Coronavirus 2 (CEPHEID -  Performed in [facility] hospital lab), Hosp Order     Status: None   Collection Time: 08/25/18  2:24 PM   Specimen: Nasopharyngeal Swab  Result Value Ref Range   SARS Coronavirus 2 NEGATIVE NEGATIVE    Comment: (NOTE) If result is NEGATIVE SARS-CoV-2 target nucleic acids are NOT DETECTED. The SARS-CoV-2 RNA is generally detectable in upper and lower  respiratory specimens during the acute phase of infection. The lowest  concentration of SARS-CoV-2 viral copies this assay can detect is 250  copies / mL. A negative result does not preclude SARS-CoV-2 infection  and should not be used as the sole basis for treatment or other  patient management decisions.  A negative result may occur with  improper specimen collection / handling, submission of specimen other  than nasopharyngeal swab, presence of viral mutation(s) within the  areas targeted by this assay, and inadequate number of viral copies  (<250 copies / mL). A negative result must be combined with clinical  observations, patient history, and epidemiological information. If result is POSITIVE SARS-CoV-2 target nucleic acids are DETECTED. The SARS-CoV-2 RNA is generally detectable in upper and lower  respiratory specimens dur ing the acute phase of infection.  Positive  results are indicative of active infection with SARS-CoV-2.  Clinical  correlation with patient history and other diagnostic information is  necessary to determine patient infection status.  Positive results do  not rule out bacterial infection or co-infection with other viruses. If result is PRESUMPTIVE POSTIVE SARS-CoV-2 nucleic acids MAY BE PRESENT.   A presumptive positive result was obtained on the submitted specimen  and confirmed on repeat testing.  While 2019 novel coronavirus  (SARS-CoV-2)  nucleic acids may be present in the submitted sample  additional confirmatory testing may be necessary for epidemiological  and / or clinical management purposes  to  differentiate between  SARS-CoV-2 and other Sarbecovirus currently known to infect humans.  If clinically indicated additional testing with an alternate test  methodology [phone]) is advised. The SARS-CoV-2 RNA is generally  detectable in upper and lower respiratory sp ecimens during the acute  phase of infection. The expected result is Negative. Fact Sheet for Patients:  StrictlyIdeas.no Fact Sheet for Healthcare Providers: BankingDealers.co.za This test is not yet approved or cleared by the [place] FDA and has been authorized for detection and/or diagnosis of SARS-CoV-2 by FDA under an Emergency Use Authorization (EUA).  This EUA will remain in effect (meaning this test can be used) for the duration of the COVID-19 declaration under Section 564(b)(1) of the Act, 21 U.S.C. section 360bbb-3(b)(1), unless the authorization is terminated or revoked sooner. Performed at [facility], [address] [place], [place] [address]   APTT     Status: Abnormal   Collection Time: 08/25/18  6:35 PM  Result Value Ref Range   aPTT >160 (HH) 24 - 36 seconds    Comment:        IF BASELINE aPTT IS ELEVATED, SUGGEST PATIENT RISK ASSESSMENT BE USED TO DETERMINE APPROPRIATE ANTICOAGULANT THERAPY. CRITICAL RESULT CALLED TO, READ BACK BY AND VERIFIED WITH: Alex Gardener 08/25/18 @ 2023  [facility] Performed at [facility], [address] [place], [place] [address]   Protime-INR     Status: None   Collection Time: 08/25/18  6:35 PM  Result Value Ref Range   Prothrombin Time 14.1 11.4 - 15.2 seconds   INR 1.1 0.8 - 1.2    Comment: (NOTE) INR goal varies based on device and disease states. Performed at [facility], [address] [place], [place] [address]    Dg Chest Portable 1 View  Result Date: 08/25/2018 CLINICAL DATA:  Tachycardia and chest pressure for the past 5 days, worse today. EXAM: PORTABLE CHEST 1 VIEW  COMPARISON:  Chest radiographs dated 11/18/2017 and chest CT dated 09/24/2016. FINDINGS: Interval borderline enlargement of the cardiac silhouette. Again demonstrated is a small to moderate-sized hiatal hernia. Mild increase in prominence of the pulmonary vasculature with interval minimally prominent interstitial markings. No pleural fluid seen. Unremarkable bones. IMPRESSION: 1. Interval borderline cardiomegaly and minimal changes of congestive heart failure. 2. Stable small to moderate-sized hiatal hernia. Electronically Signed   By: Claudie Revering M.D.   On: 08/25/2018 14:55    Pending Labs Unresulted Labs (From admission, onward)    Start     Ordered   08/26/18 9833  Basic metabolic panel  Tomorrow morning,   STAT     08/25/18 1911   08/26/18 0500  CBC  Tomorrow morning,   STAT     08/25/18 1911   08/26/18 0500  Lipid panel  Tomorrow morning,   STAT     08/25/18 1911   08/26/18 0200  Heparin level (unfractionated)  Once-Timed,   STAT     08/25/18 1802          Vitals/Pain Today's Vitals   08/25/18 1715 08/25/18 1730 08/25/18 1745 08/25/18 1755  BP:  (!) 171/100    Pulse: 84 82 72   Resp: (!) 27 (!) 25 16   Temp:      TempSrc:      SpO2: 98% 98% 99%   Weight:  Height:      PainSc:    0-No pain    Isolation Precautions No active isolations  Medications Medications  nitroGLYCERIN (NITROSTAT) SL tablet 0.4 mg (0.4 mg Sublingual Given 08/25/18 1753)  heparin ADULT infusion 100 units/mL (25000 units/236mL sodium chloride 0.45%) (800 Units/hr Intravenous New Bag/Given 08/25/18 1833)  losartan (COZAAR) tablet 50 mg (has no administration in time range)  levothyroxine (SYNTHROID) tablet 75 mcg (has no administration in time range)  Probiotic CAPS 1 capsule (has no administration in time range)  albuterol (PROVENTIL) (2.5 MG/3ML) 0.083% nebulizer solution 2.5 mg (has no administration in time range)  loratadine (CLARITIN) tablet 10 mg (has no administration in time range)   montelukast (SINGULAIR) tablet 10 mg (has no administration in time range)  mometasone-formoterol (DULERA) 200-5 MCG/ACT inhaler 2 puff (has no administration in time range)  bisoprolol (ZEBETA) tablet 5 mg (has no administration in time range)  furosemide (LASIX) injection 20 mg (has no administration in time range)  clopidogrel (PLAVIX) tablet 75 mg (has no administration in time range)  acetaminophen (TYLENOL) tablet 650 mg (has no administration in time range)    Or  acetaminophen (TYLENOL) suppository 650 mg (has no administration in time range)  ondansetron (ZOFRAN) tablet 4 mg (has no administration in time range)    Or  ondansetron (ZOFRAN) injection 4 mg (has no administration in time range)  atorvastatin (LIPITOR) tablet 40 mg (has no administration in time range)  pantoprazole (PROTONIX) EC tablet 80 mg (has no administration in time range)  clopidogrel (PLAVIX) tablet 300 mg (300 mg Oral Given 08/25/18 1443)  heparin bolus via infusion 3,400 Units (3,400 Units Intravenous Bolus from Bag 08/25/18 1834)    Mobility walks Low fall risk   Focused Assessments Cardiac Assessment Handoff:  Cardiac Rhythm: Normal sinus rhythm(left bundle branch block) Lab Results  Component Value Date   TROPONINI <0.03 07/21/2016   No results found for: DDIMER Does the Patient currently have chest pain? No        R Recommendations: See Admitting Provider Note  Report given to:   Additional Notes:

## 2018-08-25 NOTE — ED Provider Notes (Signed)
-----------------------------------------   3:11 PM on 08/25/2018 -----------------------------------------  Assumed care from Dr. Joni Fears at 3 PM. Briefly, the patient is a 78 y.o. female with PMHx of  has a past medical history of Asthma, COPD (chronic obstructive pulmonary disease) ([place]), Cough variant asthma, Diabetes ([place]), Dyslipidemia, Emphysema of lung ([place]), endometrial cancer (08/2016), HTN (hypertension), Hypertension, Hypothyroidism, and Thyroid disease. here with CP, palpitations that woke her up from sleep. Now resolved. HR 120s during time of sx, now resolved and HR is wnl.   Labs Reviewed  CBC WITH DIFFERENTIAL/PLATELET - Abnormal; Notable for the following components:      Result Value   Hemoglobin 10.7 (*)    HCT 34.0 (*)    RDW 15.9 (*)    Lymphs Abs 0.6 (*)    All other components within normal limits  COMPREHENSIVE METABOLIC PANEL  LIPASE, BLOOD  TROPONIN I (HIGH SENSITIVITY)  TROPONIN I (HIGH SENSITIVITY)  MAGNESIUM  TSH    Course of Care: Troponin positive at 392 then 517.  Clinically, she has no chest pain but concern for possible unstable angina versus demand ischemia in the setting of transient A. fib.  Discussed with Dr. Clayborn Bigness, who recommends that the hospitalist call him for a formal consult.  Patient has been given Plavix. Given increasing Trop, heparin gtt started. Concern for NSTEMI vs demand ischemia. Pt daughter updated in family room by myself. She is CP free currently.  CRITICAL CARE Performed by: Evonnie Pat   Total critical care time: 35 minutes  Critical care time was exclusive of separately billable procedures and treating other patients.  Critical care was necessary to treat or prevent imminent or life-threatening deterioration.  Critical care was time spent personally by me on the following activities: development of treatment plan with patient and/or surrogate as well as nursing, discussions with consultants, evaluation of patient's  response to treatment, examination of patient, obtaining history from patient or surrogate, ordering and performing treatments and interventions, ordering and review of laboratory studies, ordering and review of radiographic studies, pulse oximetry and re-evaluation of patient's condition.      Duffy Bruce, MD 08/26/18 [phone]

## 2018-08-25 NOTE — ED Notes (Signed)
Admission COVID sent

## 2018-08-25 NOTE — ED Notes (Addendum)
Assumed care of patient report had rapid heart rte this morning with no hx of this ever happing, reports had some tightness in the middle of her chest and contributes sob to her hx of asthma. vss at present time. Denies tightness at present time, denies sob at present time. Cardiac monitor showing sr in the high 70's. Will continue to monitor. Repeat trop to be drawn and sent.

## 2018-08-25 NOTE — ED Notes (Signed)
pharmacy at bedside to reconcile medications. Patient aware will be admitted to Cardiology for high trop results. Repeat trop drawn and sent.

## 2018-08-25 NOTE — ED Notes (Signed)
Patient started on heparin drip labs drawn and sent.

## 2018-08-25 NOTE — Consult Note (Signed)
ANTICOAGULATION CONSULT NOTE - Initial Consult  Pharmacy Consult for Hepairn Indication: chest pain/ACS  Allergies  Allergen Reactions  . Oxycodone Itching  . Sulfa Antibiotics Hives  . Amoxicillin Hives and Rash    Has patient had a PCN reaction causing immediate rash, facial/tongue/throat swelling, SOB or lightheadedness with hypotension: Yes Has patient had a PCN reaction causing severe rash involving mucus membranes or skin necrosis: Yes Has patient had a PCN reaction that required hospitalization: No Has patient had a PCN reaction occurring within the last 10 years: Yes If all of the above answers are "NO", then may proceed with Cephalosporin use.   Diona Fanti [Aspirin] Hives  . Levaquin [Levofloxacin In D5w] Hives    Patient Measurements: Height: 5' (152.4 cm) Weight: 150 lb (68 kg) IBW/kg (Calculated) : 45.5 Heparin Dosing Weight: 68 kg  Vital Signs: Temp: 98.3 F (36.8 C) (07/09 1419) Temp Source: Oral (07/09 1419) BP: 171/100 (07/09 1730) Pulse Rate: 72 (07/09 1745)  Labs: Recent Labs    08/25/18 1424  HGB 10.7*  HCT 34.0*  PLT 388  CREATININE 0.81  TROPONINIHS 517*  392*    Estimated Creatinine Clearance: 48.5 mL/min (by C-G formula based on SCr of 0.81 mg/dL).   Medical History: Past Medical History:  Diagnosis Date  . Asthma   . COPD (chronic obstructive pulmonary disease) ([place])   . Cough variant asthma   . Diabetes ([place])    states was told she was not diabetic tested high once on hemoglobin A1C  . Dyslipidemia   . Emphysema of lung ([place])   . endometrial cancer 08/2016   Total Hysterectomy 09/04/2016  . HTN (hypertension)   . Hypertension   . Hypothyroidism   . Thyroid disease     Medications:  (Not in a hospital admission)  Scheduled:   Infusions:   PRN: nitroGLYCERIN Anti-infectives (From admission, onward)   None      Assessment: Pharmacy was consulted for heparin for ACS. No DOAC PTA.   Goal of Therapy:  Heparin level  0.3-0.7 units/ml Monitor platelets by anticoagulation protocol: Yes   Plan:  Give 3400 units bolus x 1 Start heparin infusion at 800 units/hr Check anti-Xa level in 8 hours and daily while on heparin Continue to monitor H&H and platelets  Oswald Hillock, PharmD, BCPS 08/25/2018,5:59 PM

## 2018-08-25 NOTE — Progress Notes (Signed)
Patient ID: Anita Kent, female   DOB: 1940-07-31, 78 y.o.   MRN: [id number]  ACP note  Patient present.  Family able to speak with the daughter on the phone and give an update.  Patient coming in with chest pain.  She always has shortness of breath.  The other day had clammy sweaty and fatigued feeling and had her blood pressure on the lower side.  In the ER she was found to have a rising troponin.  Hospitalist were called concerning for myocardial infarction versus ACS.  Patient wishes to be a DO NOT RESUSCITATE  Plan.  Placed on heparin drip.  Patient loaded on Plavix and she has an allergy to aspirin.  Start low-dose bisoprolol and statin.  Cardiology consultation for consideration for further testing tomorrow.  Time spent on ACP discussion 17 minutes Dr Loletha Grayer

## 2018-08-25 NOTE — ED Notes (Signed)
Report to receiving nurse. Will be down to pick patient up.

## 2018-08-25 NOTE — Discharge Instructions (Signed)
Take plavix as prescribed until you follow up with cardiology to protect against blood clots in case your palpitations are due to intermittent episodes of atrial fibrillation.

## 2018-08-26 ENCOUNTER — Inpatient Hospital Stay
Admit: 2018-08-26 | Discharge: 2018-08-26 | Disposition: A | Discharge status: Home / Self Care | Payer: Medicare Other | Attending: Internal Medicine | Admitting: Internal Medicine

## 2018-08-26 DIAGNOSIS — R002 Palpitations: Secondary | ICD-10-CM | POA: Diagnosis not present

## 2018-08-26 DIAGNOSIS — R7989 Other specified abnormal findings of blood chemistry: Secondary | ICD-10-CM | POA: Diagnosis not present

## 2018-08-26 LAB — ECHOCARDIOGRAM COMPLETE
Height: 60 in
Weight: 2382.4 oz

## 2018-08-26 LAB — CBC
HCT: 34.4 % — ABNORMAL LOW (ref 36.0–46.0)
Hemoglobin: 10.6 g/dL — ABNORMAL LOW (ref 12.0–15.0)
MCH: 26.7 pg (ref 26.0–34.0)
MCHC: 30.8 g/dL (ref 30.0–36.0)
MCV: 86.6 fL (ref 80.0–100.0)
Platelets: 346 10*3/uL (ref 150–400)
RBC: 3.97 MIL/uL (ref 3.87–5.11)
RDW: 16 % — ABNORMAL HIGH (ref 11.5–15.5)
WBC: 4.5 10*3/uL (ref 4.0–10.5)
nRBC: 0 % (ref 0.0–0.2)

## 2018-08-26 LAB — LIPID PANEL
Cholesterol: 239 mg/dL — ABNORMAL HIGH (ref 0–200)
HDL: 78 mg/dL (ref >40)
LDL Cholesterol: 141 mg/dL — ABNORMAL HIGH (ref 0–99)
Total CHOL/HDL Ratio: 3.1 RATIO
Triglycerides: 98 mg/dL (ref <150)
VLDL: 20 mg/dL (ref 0–40)

## 2018-08-26 LAB — BASIC METABOLIC PANEL
Anion gap: 7 (ref 5–15)
BUN: 13 mg/dL (ref 8–23)
CO2: 25 mmol/L (ref 22–32)
Calcium: 9.2 mg/dL (ref 8.9–10.3)
Chloride: 107 mmol/L (ref 98–111)
Creatinine, Ser: 1 mg/dL (ref 0.44–1.00)
GFR calc Af Amer: >60 mL/min (ref >60)
GFR calc non Af Amer: 54 mL/min — ABNORMAL LOW (ref >60)
Glucose, Bld: 112 mg/dL — ABNORMAL HIGH (ref 70–99)
Potassium: 4.3 mmol/L (ref 3.5–5.1)
Sodium: 139 mmol/L (ref 135–145)

## 2018-08-26 LAB — TROPONIN I (HIGH SENSITIVITY)
Troponin I (High Sensitivity): 719 ng/L (ref <18)
Troponin I (High Sensitivity): 565 ng/L (ref <18)

## 2018-08-26 LAB — HEPARIN LEVEL (UNFRACTIONATED)
Heparin Unfractionated: 0.22 IU/mL — ABNORMAL LOW (ref 0.30–0.70)
Heparin Unfractionated: 0.53 IU/mL (ref 0.30–0.70)

## 2018-08-26 MED ORDER — ATORVASTATIN CALCIUM 40 MG PO TABS: 40.0000 mg | ORAL_TABLET | Freq: Every day | ORAL | 0 refills | Status: AC

## 2018-08-26 MED ORDER — NITROGLYCERIN 0.4 MG SL SUBL: 0.4000 mg | SUBLINGUAL_TABLET | SUBLINGUAL | 12 refills | Status: DC | PRN

## 2018-08-26 MED ORDER — BISOPROLOL FUMARATE 5 MG PO TABS: 5.0000 mg | ORAL_TABLET | Freq: Every day | ORAL | 0 refills | Status: DC

## 2018-08-26 MED ORDER — SODIUM CHLORIDE 0.9% FLUSH
3.0000 mL | Freq: Two times a day (BID) | INTRAVENOUS | Status: DC
  Administered 2018-08-26: 3 mL via INTRAVENOUS

## 2018-08-26 MED ORDER — SODIUM CHLORIDE 0.9% FLUSH: 3.0000 mL | INTRAVENOUS | Status: DC | PRN

## 2018-08-26 MED ORDER — HEPARIN BOLUS VIA INFUSION
1000.0000 [IU] | Freq: Once | INTRAVENOUS | Status: AC
  Administered 2018-08-26: 1000 [IU] via INTRAVENOUS
  Filled 2018-08-26: qty 1000

## 2018-08-26 NOTE — Plan of Care (Signed)
  Problem: Clinical Measurements: Goal: Ability to maintain clinical measurements within normal limits will improve Outcome: Progressing Goal: Respiratory complications will improve Outcome: Progressing   Problem: Activity: Goal: Risk for activity intolerance will decrease Outcome: Progressing   Problem: Pain Managment: Goal: General experience of comfort will improve Outcome: Progressing   Problem: Safety: Goal: Ability to remain free from injury will improve Outcome: Progressing   Problem: Skin Integrity: Goal: Risk for impaired skin integrity will decrease Outcome: Progressing   

## 2018-08-26 NOTE — Progress Notes (Signed)
*  PRELIMINARY RESULTS* Echocardiogram 2D Echocardiogram has been performed.  Sherrie Sport 08/26/2018, 10:53 AM

## 2018-08-26 NOTE — Discharge Summary (Signed)
[facility] [place] NAME: Anita Kent    MR#:  [id number]  Agency OF BIRTH:  09-09-1940  DATE OF ADMISSION:  08/25/2018 ADMITTING PHYSICIAN: Loletha Grayer, MD  DATE OF DISCHARGE: 08/26/2018  PRIMARY CARE PHYSICIAN: Marinda Elk, MD    ADMISSION DIAGNOSIS:  Palpitations [R00.2]  DISCHARGE DIAGNOSIS:  Active Problems:   Chest pain   SECONDARY DIAGNOSIS:   Past Medical History:  Diagnosis Date  . Asthma   . COPD (chronic obstructive pulmonary disease) ([place])   . Cough variant asthma   . Diabetes ([place])    states was told she was not diabetic tested high once on hemoglobin A1C  . Dyslipidemia   . Emphysema of lung ([place])   . endometrial cancer 08/2016   Total Hysterectomy 09/04/2016  . HTN (hypertension)   . Hypertension   . Hypothyroidism   . Thyroid disease     HOSPITAL COURSE:  78 year old female with a history of eosinophilic asthma who presented to the emergency room due to chest pain.  1.  Elevated troponin: This was not felt to be NSTEMI/ACS as per Cardiology. SHe was chest pain free while in the hospital. She will follow up with Dr Fabio Asa on MOnday. LDL 141 Continue Plavix (allergy to aspirin), Zebeta and Lipitor  2.  Hypothyroidism: Continue Synthroid  3.  Essential hypertension: Continue losartan and bisoprolol  4.  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  Hyperlipidemia with LDL 141 not quite at goal Management as per cardiology   DISCHARGE CONDITIONS AND DIET:  Cardiac diet Stable discharge  CONSULTS OBTAINED:    DRUG ALLERGIES:   Allergies  Allergen Reactions  . Oxycodone Itching  . Sulfa Antibiotics Hives  . Amoxicillin Hives and Rash    Has patient had a PCN reaction causing immediate rash, facial/tongue/throat swelling, SOB or lightheadedness with hypotension: Yes Has patient had a PCN reaction causing severe rash involving mucus membranes or skin  necrosis: Yes Has patient had a PCN reaction that required hospitalization: No Has patient had a PCN reaction occurring within the last 10 years: Yes If all of the above answers are "NO", then may proceed with Cephalosporin use.   Diona Fanti [Aspirin] Hives  . Levaquin [Levofloxacin In D5w] Hives    DISCHARGE MEDICATIONS:   Allergies as of 08/26/2018      Reactions   Oxycodone Itching   Sulfa Antibiotics Hives   Amoxicillin Hives, Rash   Has patient had a PCN reaction causing immediate rash, facial/tongue/throat swelling, SOB or lightheadedness with hypotension: Yes Has patient had a PCN reaction causing severe rash involving mucus membranes or skin necrosis: Yes Has patient had a PCN reaction that required hospitalization: No Has patient had a PCN reaction occurring within the last 10 years: Yes If all of the above answers are "NO", then may proceed with Cephalosporin use.   Asa [aspirin] Hives   Levaquin [levofloxacin In D5w] Hives      Medication List    TAKE these medications   Albuterol Sulfate 108 (90 Base) MCG/ACT Aepb Commonly known as: ProAir RespiClick Inhale 2 puffs into the lungs every 6 (six) hours as needed.   albuterol (2.5 MG/3ML) 0.083% nebulizer solution Commonly known as: PROVENTIL Take 3 mLs (2.5 mg total) by nebulization every 6 (six) hours as needed for wheezing or shortness of breath.   atorvastatin 40 MG tablet Commonly known as: LIPITOR Take 1 tablet (40 mg total) by mouth daily at 6 PM.  bisoprolol 5 MG tablet Commonly known as: ZEBETA Take 1 tablet (5 mg total) by mouth daily. Start taking on: August 27, 2018   Claritin 10 MG tablet Generic drug: loratadine Take 10 mg by mouth daily.   clopidogrel 75 MG tablet Commonly known as: Plavix Take 1 tablet (75 mg total) by mouth daily for 20 days.   ibuprofen 200 MG tablet Commonly known as: ADVIL Take 400 mg by mouth as needed for headache or moderate pain.   levothyroxine 75 MCG tablet Commonly  known as: SYNTHROID Take 75 mcg by mouth daily before breakfast.   losartan 50 MG tablet Commonly known as: COZAAR Take 50 mg by mouth daily.   megestrol 40 MG tablet Commonly known as: MEGACE Take 1 tablet (40 mg total) by mouth 2 (two) times daily.   montelukast 10 MG tablet Commonly known as: Singulair Take 1 tablet (10 mg total) by mouth at bedtime.   nitroGLYCERIN 0.4 MG SL tablet Commonly known as: NITROSTAT Place 1 tablet (0.4 mg total) under the tongue every 5 (five) minutes as needed for chest pain.   Nucala 100 MG/ML Soaj Generic drug: Mepolizumab Inject 100 mg into the muscle every 30 (thirty) days.   omeprazole 20 MG tablet Commonly known as: PRILOSEC OTC Take 2 tablets (40 mg total) by mouth at bedtime.   Probiotic Caps Take 1 capsule by mouth daily.   Wixela Inhub 250-50 MCG/DOSE Aepb Generic drug: Fluticasone-Salmeterol INHALE 1 DOSE BY MOUTH TWICE DAILY         Today   CHIEF COMPLAINT:  Chest pain free overnight   VITAL SIGNS:  Blood pressure (!) 155/64, pulse 70, temperature 97.7 F (36.5 C), temperature source Oral, resp. rate 18, height 5' (1.524 m), weight 67.5 kg, SpO2 98 %.   REVIEW OF SYSTEMS:  Review of Systems  Constitutional: Negative.  Negative for chills, fever and malaise/fatigue.  HENT: Negative.  Negative for ear discharge, ear pain, hearing loss, nosebleeds and sore throat.   Eyes: Negative.  Negative for blurred vision and pain.  Respiratory: Negative.  Negative for cough, hemoptysis, shortness of breath and wheezing.   Cardiovascular: Negative.  Negative for chest pain, palpitations and leg swelling.  Gastrointestinal: Negative.  Negative for abdominal pain, blood in stool, diarrhea, nausea and vomiting.  Genitourinary: Negative.  Negative for dysuria.  Musculoskeletal: Negative.  Negative for back pain.  Skin: Negative.   Neurological: Negative for dizziness, tremors, speech change, focal weakness, seizures and  headaches.  Endo/Heme/Allergies: Negative.  Does not bruise/bleed easily.  Psychiatric/Behavioral: Negative.  Negative for depression, hallucinations and suicidal ideas.     PHYSICAL EXAMINATION:  GENERAL:  78 y.o.-year-old patient lying in the bed with no acute distress.  NECK:  Supple, no jugular venous distention. No thyroid enlargement, no tenderness.  LUNGS: Normal breath sounds bilaterally, no wheezing, rales,rhonchi  No use of accessory muscles of respiration.  CARDIOVASCULAR: S1, S2 normal. No murmurs, rubs, or gallops.  ABDOMEN: Soft, non-tender, non-distended. Bowel sounds present. No organomegaly or mass.  EXTREMITIES: No pedal edema, cyanosis, or clubbing.  PSYCHIATRIC: The patient is alert and oriented x 3.  SKIN: No obvious rash, lesion, or ulcer.   DATA REVIEW:   CBC Recent Labs  Lab 08/26/18 0148  WBC 4.5  HGB 10.6*  HCT 34.4*  PLT 346    Chemistries  Recent Labs  Lab 08/25/18 1424 08/26/18 0148  NA 137 139  K 4.0 4.3  CL 109 107  CO2 20* 25  GLUCOSE 94 112*  BUN 13 13  CREATININE 0.81 1.00  CALCIUM 9.2 9.2  MG 2.0  --   AST 21  --   ALT 16  --   ALKPHOS 80  --   BILITOT 0.3  --     Cardiac Enzymes No results for input(s): TROPONINI in the last 168 hours.  Microbiology Results  @MICRORSLT48 @  RADIOLOGY:  Dg Chest Portable 1 View  Result Date: 08/25/2018 CLINICAL DATA:  Tachycardia and chest pressure for the past 5 days, worse today. EXAM: PORTABLE CHEST 1 VIEW COMPARISON:  Chest radiographs dated 11/18/2017 and chest CT dated 09/24/2016. FINDINGS: Interval borderline enlargement of the cardiac silhouette. Again demonstrated is a small to moderate-sized hiatal hernia. Mild increase in prominence of the pulmonary vasculature with interval minimally prominent interstitial markings. No pleural fluid seen. Unremarkable bones. IMPRESSION: 1. Interval borderline cardiomegaly and minimal changes of congestive heart failure. 2. Stable small to  moderate-sized hiatal hernia. Electronically Signed   By: Claudie Revering M.D.   On: 08/25/2018 14:55      Allergies as of 08/26/2018      Reactions   Oxycodone Itching   Sulfa Antibiotics Hives   Amoxicillin Hives, Rash   Has patient had a PCN reaction causing immediate rash, facial/tongue/throat swelling, SOB or lightheadedness with hypotension: Yes Has patient had a PCN reaction causing severe rash involving mucus membranes or skin necrosis: Yes Has patient had a PCN reaction that required hospitalization: No Has patient had a PCN reaction occurring within the last 10 years: Yes If all of the above answers are "NO", then may proceed with Cephalosporin use.   Asa [aspirin] Hives   Levaquin [levofloxacin In D5w] Hives      Medication List    TAKE these medications   Albuterol Sulfate 108 (90 Base) MCG/ACT Aepb Commonly known as: ProAir RespiClick Inhale 2 puffs into the lungs every 6 (six) hours as needed.   albuterol (2.5 MG/3ML) 0.083% nebulizer solution Commonly known as: PROVENTIL Take 3 mLs (2.5 mg total) by nebulization every 6 (six) hours as needed for wheezing or shortness of breath.   atorvastatin 40 MG tablet Commonly known as: LIPITOR Take 1 tablet (40 mg total) by mouth daily at 6 PM.   bisoprolol 5 MG tablet Commonly known as: ZEBETA Take 1 tablet (5 mg total) by mouth daily. Start taking on: August 27, 2018   Claritin 10 MG tablet Generic drug: loratadine Take 10 mg by mouth daily.   clopidogrel 75 MG tablet Commonly known as: Plavix Take 1 tablet (75 mg total) by mouth daily for 20 days.   ibuprofen 200 MG tablet Commonly known as: ADVIL Take 400 mg by mouth as needed for headache or moderate pain.   levothyroxine 75 MCG tablet Commonly known as: SYNTHROID Take 75 mcg by mouth daily before breakfast.   losartan 50 MG tablet Commonly known as: COZAAR Take 50 mg by mouth daily.   megestrol 40 MG tablet Commonly known as: MEGACE Take 1 tablet (40 mg  total) by mouth 2 (two) times daily.   montelukast 10 MG tablet Commonly known as: Singulair Take 1 tablet (10 mg total) by mouth at bedtime.   nitroGLYCERIN 0.4 MG SL tablet Commonly known as: NITROSTAT Place 1 tablet (0.4 mg total) under the tongue every 5 (five) minutes as needed for chest pain.   Nucala 100 MG/ML Soaj Generic drug: Mepolizumab Inject 100 mg into the muscle every 30 (thirty) days.   omeprazole 20 MG tablet Commonly known as: PRILOSEC OTC  Take 2 tablets (40 mg total) by mouth at bedtime.   Probiotic Caps Take 1 capsule by mouth daily.   Wixela Inhub 250-50 MCG/DOSE Aepb Generic drug: Fluticasone-Salmeterol INHALE 1 DOSE BY MOUTH TWICE DAILY         Management plans discussed with the patient and she is in agreement. Stable for discharge home  Patient should follow up with cardiology monday  CODE STATUS:     Code Status Orders  (From admission, onward)         Start     Ordered   08/25/18 1911  Do not attempt resuscitation (DNR)  Continuous    Question Answer Comment  In the event of cardiac or respiratory ARREST Do not call a "code blue"   In the event of cardiac or respiratory ARREST Do not perform Intubation, CPR, defibrillation or ACLS   In the event of cardiac or respiratory ARREST Use medication by any route, position, wound care, and other measures to relive pain and suffering. May use oxygen, suction and manual treatment of airway obstruction as needed for comfort.   Comments nurse may pronounce      08/25/18 1911        Code Status History    This patient has a current code status but no historical code status.   Advance Care Planning Activity    Advance Directive Documentation     Most Recent Value  Type of Advance Directive  Healthcare Power of Attorney, Living will, Out of facility DNR (pink MOST or yellow form)  Pre-existing out of facility DNR order (yellow form or pink MOST form)  -  "MOST" Form in Place?  -      TOTAL  TIME TAKING CARE OF THIS PATIENT: 38 minutes minutes.    Note: This dictation was prepared with Dragon dictation along with smaller phrase technology. Any transcriptional errors that result from this process are unintentional.  Bettey Costa M.D on 08/26/2018 at 2:23 PM  Between 7am to 6pm - Pager - [phone] After 6pm go to [email or web address] - password [email or web address] [facility]  Office  [phone]  CC: Primary care physician; Marinda Elk, MD

## 2018-08-26 NOTE — Consult Note (Signed)
ANTICOAGULATION CONSULT NOTE - Initial Consult  Pharmacy Consult for Hepairn Indication: chest pain/ACS  Allergies  Allergen Reactions  . Oxycodone Itching  . Sulfa Antibiotics Hives  . Amoxicillin Hives and Rash    Has patient had a PCN reaction causing immediate rash, facial/tongue/throat swelling, SOB or lightheadedness with hypotension: Yes Has patient had a PCN reaction causing severe rash involving mucus membranes or skin necrosis: Yes Has patient had a PCN reaction that required hospitalization: No Has patient had a PCN reaction occurring within the last 10 years: Yes If all of the above answers are "NO", then may proceed with Cephalosporin use.   Anita Kent [Aspirin] Hives  . Levaquin [Levofloxacin In D5w] Hives    Patient Measurements: Height: 5' (152.4 cm) Weight: 152 lb 14.4 oz (69.4 kg) IBW/kg (Calculated) : 45.5 Heparin Dosing Weight: 68 kg  Vital Signs: Temp: 98.1 F (36.7 C) (07/09 2227) Temp Source: Oral (07/09 2227) BP: 181/84 (07/09 2227) Pulse Rate: 83 (07/09 2227)  Labs: Recent Labs    08/25/18 1424 08/25/18 1835 08/26/18 0148  HGB 10.7*  --  10.6*  HCT 34.0*  --  34.4*  PLT 388  --  346  APTT  --  >160*  --   LABPROT  --  14.1  --   INR  --  1.1  --   HEPARINUNFRC  --   --  0.22*  CREATININE 0.81  --  1.00  TROPONINIHS 517*  392*  --   --     Estimated Creatinine Clearance: 39.7 mL/min (by C-G formula based on SCr of 1 mg/dL).   Medical History: Past Medical History:  Diagnosis Date  . Asthma   . COPD (chronic obstructive pulmonary disease) ([place])   . Cough variant asthma   . Diabetes ([place])    states was told she was not diabetic tested high once on hemoglobin A1C  . Dyslipidemia   . Emphysema of lung ([place])   . endometrial cancer 08/2016   Total Hysterectomy 09/04/2016  . HTN (hypertension)   . Hypertension   . Hypothyroidism   . Thyroid disease     Medications:  Medications Prior to Admission  Medication Sig Dispense Refill Last  Dose  . levothyroxine (SYNTHROID, LEVOTHROID) 75 MCG tablet Take 75 mcg by mouth daily before breakfast.   08/25/2018 at 0800  . losartan (COZAAR) 50 MG tablet Take 50 mg by mouth daily.   08/25/2018 at 0800  . megestrol (MEGACE) 40 MG tablet Take 1 tablet (40 mg total) by mouth 2 (two) times daily. 60 tablet 6 Past Month at Unknown time  . montelukast (SINGULAIR) 10 MG tablet Take 1 tablet (10 mg total) by mouth at bedtime. 90 tablet 4 08/24/2018 at 2000  . omeprazole (PRILOSEC OTC) 20 MG tablet Take 2 tablets (40 mg total) by mouth at bedtime. 60 tablet 10 08/25/2018 at 0800  . Probiotic CAPS Take 1 capsule by mouth daily.   08/25/2018 at 0800  . WIXELA INHUB 250-50 MCG/DOSE AEPB INHALE 1 DOSE BY MOUTH TWICE DAILY 60 each 10 08/25/2018 at 0800  . albuterol (PROVENTIL) (2.5 MG/3ML) 0.083% nebulizer solution Take 3 mLs (2.5 mg total) by nebulization every 6 (six) hours as needed for wheezing or shortness of breath. 150 mL 5 prn at prn  . Albuterol Sulfate (PROAIR RESPICLICK) 951 (90 Base) MCG/ACT AEPB Inhale 2 puffs into the lungs every 6 (six) hours as needed. 1 each 11 prn at prn  . ibuprofen (ADVIL,MOTRIN) 200 MG tablet Take 400  mg by mouth as needed for headache or moderate pain.    prn at prn  . loratadine (CLARITIN) 10 MG tablet Take 10 mg by mouth daily.   prn at prn  . NUCALA 100 MG/ML SOAJ Inject 100 mg into the muscle every 30 (thirty) days.      Scheduled:  . acidophilus  1 capsule Oral Daily  . atorvastatin  40 mg Oral q1800  . bisoprolol  5 mg Oral Daily  . clopidogrel  75 mg Oral Daily  . levothyroxine  75 mcg Oral QAC breakfast  . loratadine  10 mg Oral QHS  . losartan  50 mg Oral Daily  . mometasone-formoterol  2 puff Inhalation BID  . montelukast  10 mg Oral QHS  . pantoprazole  80 mg Oral Daily   Infusions:  . heparin 800 Units/hr (08/25/18 1833)   PRN: acetaminophen **OR** acetaminophen, albuterol, nitroGLYCERIN, ondansetron **OR** ondansetron (ZOFRAN) IV Anti-infectives (From  admission, onward)   None      Assessment: Pharmacy was consulted for heparin for ACS. No DOAC PTA.   7/9 Heparin infusion started @ 800 units/hr  Goal of Therapy:  Heparin level 0.3-0.7 units/ml Monitor platelets by anticoagulation protocol: Yes   Plan:  7/10 @ 0148 HL: 0.22. Level is subtherapeutic.  Will order 1000 unit heparin bolus and increase infusion rate to 900 units/hr. Recheck anti-Xa level in 6 hours and daily while on heparin Continue to monitor H&H and platelets  Marquia Costello Lorrin Mais, PharmD, BCPS 08/26/2018,2:20 AM

## 2018-08-26 NOTE — Progress Notes (Signed)
CRITICAL VALUE ALERT  Critical Value:  Troponin  719  Date & Time Notied: 7/10 1009  Provider Notified: Dr. Brett Albino, [facility] text  Orders Received/Actions taken: None at this time

## 2018-08-26 NOTE — Progress Notes (Signed)
[facility] [place] NAME: Anita Kent    MR#:  [id number]  DATE OF BIRTH:  May 25, 1940  SUBJECTIVE:   Denies chest pain overnight.  REVIEW OF SYSTEMS:    Review of Systems  Constitutional: Negative for fever, chills weight loss HENT: Negative for ear pain, nosebleeds, congestion, facial swelling, rhinorrhea, neck pain, neck stiffness and ear discharge.   Respiratory: Negative for cough, shortness of breath, wheezing  Cardiovascular: Negative for chest pain, palpitations and leg swelling.  Gastrointestinal: Negative for heartburn, abdominal pain, vomiting, diarrhea or consitpation Genitourinary: Negative for dysuria, urgency, frequency, hematuria Musculoskeletal: Negative for back pain or joint pain Neurological: Negative for dizziness, seizures, syncope, focal weakness,  numbness and headaches.  Hematological: Does not bruise/bleed easily.  Psychiatric/Behavioral: Negative for hallucinations, confusion, dysphoric mood    Tolerating Diet: npo      DRUG ALLERGIES:   Allergies  Allergen Reactions  . Oxycodone Itching  . Sulfa Antibiotics Hives  . Amoxicillin Hives and Rash    Has patient had a PCN reaction causing immediate rash, facial/tongue/throat swelling, SOB or lightheadedness with hypotension: Yes Has patient had a PCN reaction causing severe rash involving mucus membranes or skin necrosis: Yes Has patient had a PCN reaction that required hospitalization: No Has patient had a PCN reaction occurring within the last 10 years: Yes If all of the above answers are "NO", then may proceed with Cephalosporin use.   Diona Fanti [Aspirin] Hives  . Levaquin [Levofloxacin In D5w] Hives    VITALS:  Blood pressure (!) 155/64, pulse 70, temperature 97.7 F (36.5 C), temperature source Oral, resp. rate 18, height 5' (1.524 m), weight 67.5 kg, SpO2 98 %.  PHYSICAL EXAMINATION:  Constitutional: Appears well-developed and well-nourished.  No distress. HENT: Normocephalic. Marland Kitchen Oropharynx is clear and moist.  Eyes: Conjunctivae and EOM are normal. PERRLA, no scleral icterus.  Neck: Normal ROM. Neck supple. No JVD. No tracheal deviation. CVS: RRR, S1/S2 +, no murmurs, no gallops, no carotid bruit.  Pulmonary: Effort and breath sounds normal, no stridor, rhonchi, wheezes, rales.  Abdominal: Soft. BS +,  no distension, tenderness, rebound or guarding.  Musculoskeletal: Normal range of motion. No edema and no tenderness.  Neuro: Alert. CN 2-12 grossly intact. No focal deficits. Skin: Skin is warm and dry. No rash noted. Psychiatric: Normal mood and affect.      LABORATORY PANEL:   CBC Recent Labs  Lab 08/26/18 0148  WBC 4.5  HGB 10.6*  HCT 34.4*  PLT 346   ------------------------------------------------------------------------------------------------------------------  Chemistries  Recent Labs  Lab 08/25/18 1424 08/26/18 0148  NA 137 139  K 4.0 4.3  CL 109 107  CO2 20* 25  GLUCOSE 94 112*  BUN 13 13  CREATININE 0.81 1.00  CALCIUM 9.2 9.2  MG 2.0  --   AST 21  --   ALT 16  --   ALKPHOS 80  --   BILITOT 0.3  --    ------------------------------------------------------------------------------------------------------------------  Cardiac Enzymes No results for input(s): TROPONINI in the last 168 hours. ------------------------------------------------------------------------------------------------------------------  RADIOLOGY:  Dg Chest Portable 1 View  Result Date: 08/25/2018 CLINICAL DATA:  Tachycardia and chest pressure for the past 5 days, worse today. EXAM: PORTABLE CHEST 1 VIEW COMPARISON:  Chest radiographs dated 11/18/2017 and chest CT dated 09/24/2016. FINDINGS: Interval borderline enlargement of the cardiac silhouette. Again demonstrated is a small to moderate-sized hiatal hernia. Mild increase in prominence of the pulmonary vasculature with interval minimally prominent interstitial markings. No  pleural fluid seen. Unremarkable bones. IMPRESSION: 1. Interval borderline cardiomegaly and minimal changes of congestive heart failure. 2. Stable small to moderate-sized hiatal hernia. Electronically Signed   By: Claudie Revering M.D.   On: 08/25/2018 14:55     ASSESSMENT AND PLAN:   78 year old female with a history of eosinophilic asthma who presented to the emergency room due to chest pain.  1.  Non-STEMI: Patient is currently on heparin drip and awaiting cardiology consultation and echocardiogram LDL 141 Continue Plavix (allergy to aspirin), Zebeta and Lipitor  2.  Hypothyroidism: Continue Synthroid  3.  Essential hypertension: Continue losartan and bisoprolol  4.  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  Hyperlipidemia with LDL 141 not quite at goal Management as per cardiology       Management plans discussed with the patient and she is in agreement.  CODE STATUS: Full  TOTAL TIME TAKING CARE OF THIS PATIENT: 30 minutes.     POSSIBLE D/C next week if cardiac cath planned, DEPENDING ON CLINICAL CONDITION.   Bettey Costa M.D on 08/26/2018 at 10:34 AM  Between 7am to 6pm - Pager - [phone] After 6pm go to [email or web address] - password [email or web address] [facility]  Office  [phone]  CC: Primary care physician; Marinda Elk, MD  Note: This dictation was prepared with Dragon dictation along with smaller phrase technology. Any transcriptional errors that result from this process are unintentional.

## 2018-08-26 NOTE — Consult Note (Signed)
ANTICOAGULATION CONSULT NOTE - Initial Consult  Pharmacy Consult for Hepairn Indication: chest pain/ACS  Allergies  Allergen Reactions  . Oxycodone Itching  . Sulfa Antibiotics Hives  . Amoxicillin Hives and Rash    Has patient had a PCN reaction causing immediate rash, facial/tongue/throat swelling, SOB or lightheadedness with hypotension: Yes Has patient had a PCN reaction causing severe rash involving mucus membranes or skin necrosis: Yes Has patient had a PCN reaction that required hospitalization: No Has patient had a PCN reaction occurring within the last 10 years: Yes If all of the above answers are "NO", then may proceed with Cephalosporin use.   Diona Fanti [Aspirin] Hives  . Levaquin [Levofloxacin In D5w] Hives    Patient Measurements: Height: 5' (152.4 cm) Weight: 148 lb 14.4 oz (67.5 kg) IBW/kg (Calculated) : 45.5 Heparin Dosing Weight: 68 kg  Vital Signs: Temp: 97.7 F (36.5 C) (07/10 0403) Temp Source: Oral (07/10 0831) BP: 155/64 (07/10 0831) Pulse Rate: 70 (07/10 0831)  Labs: Recent Labs    08/25/18 1424 08/25/18 1835 08/26/18 0148 08/26/18 0846  HGB 10.7*  --  10.6*  --   HCT 34.0*  --  34.4*  --   PLT 388  --  346  --   APTT  --  >160*  --   --   LABPROT  --  14.1  --   --   INR  --  1.1  --   --   HEPARINUNFRC  --   --  0.22* 0.53  CREATININE 0.81  --  1.00  --   TROPONINIHS 517*  392*  --   --   --     Estimated Creatinine Clearance: 39.1 mL/min (by C-G formula based on SCr of 1 mg/dL).   Medical History: Past Medical History:  Diagnosis Date  . Asthma   . COPD (chronic obstructive pulmonary disease) ([place])   . Cough variant asthma   . Diabetes ([place])    states was told she was not diabetic tested high once on hemoglobin A1C  . Dyslipidemia   . Emphysema of lung ([place])   . endometrial cancer 08/2016   Total Hysterectomy 09/04/2016  . HTN (hypertension)   . Hypertension   . Hypothyroidism   . Thyroid disease     Medications:   Medications Prior to Admission  Medication Sig Dispense Refill Last Dose  . levothyroxine (SYNTHROID, LEVOTHROID) 75 MCG tablet Take 75 mcg by mouth daily before breakfast.   08/25/2018 at 0800  . losartan (COZAAR) 50 MG tablet Take 50 mg by mouth daily.   08/25/2018 at 0800  . megestrol (MEGACE) 40 MG tablet Take 1 tablet (40 mg total) by mouth 2 (two) times daily. 60 tablet 6 Past Month at Unknown time  . montelukast (SINGULAIR) 10 MG tablet Take 1 tablet (10 mg total) by mouth at bedtime. 90 tablet 4 08/24/2018 at 2000  . omeprazole (PRILOSEC OTC) 20 MG tablet Take 2 tablets (40 mg total) by mouth at bedtime. 60 tablet 10 08/25/2018 at 0800  . Probiotic CAPS Take 1 capsule by mouth daily.   08/25/2018 at 0800  . WIXELA INHUB 250-50 MCG/DOSE AEPB INHALE 1 DOSE BY MOUTH TWICE DAILY 60 each 10 08/25/2018 at 0800  . albuterol (PROVENTIL) (2.5 MG/3ML) 0.083% nebulizer solution Take 3 mLs (2.5 mg total) by nebulization every 6 (six) hours as needed for wheezing or shortness of breath. 150 mL 5 prn at prn  . Albuterol Sulfate (PROAIR RESPICLICK) 419 (90 Base) MCG/ACT AEPB  Inhale 2 puffs into the lungs every 6 (six) hours as needed. 1 each 11 prn at prn  . ibuprofen (ADVIL,MOTRIN) 200 MG tablet Take 400 mg by mouth as needed for headache or moderate pain.    prn at prn  . loratadine (CLARITIN) 10 MG tablet Take 10 mg by mouth daily.   prn at prn  . NUCALA 100 MG/ML SOAJ Inject 100 mg into the muscle every 30 (thirty) days.      Scheduled:  . acidophilus  1 capsule Oral Daily  . atorvastatin  40 mg Oral q1800  . bisoprolol  5 mg Oral Daily  . clopidogrel  75 mg Oral Daily  . levothyroxine  75 mcg Oral QAC breakfast  . loratadine  10 mg Oral QHS  . losartan  50 mg Oral Daily  . mometasone-formoterol  2 puff Inhalation BID  . montelukast  10 mg Oral QHS  . pantoprazole  80 mg Oral Daily  . sodium chloride flush  3 mL Intravenous Q12H   Infusions:  . heparin 900 Units/hr (08/26/18 0249)   PRN:  acetaminophen **OR** acetaminophen, albuterol, nitroGLYCERIN, ondansetron **OR** ondansetron (ZOFRAN) IV, sodium chloride flush Anti-infectives (From admission, onward)   None      Assessment: Pharmacy was consulted for heparin for ACS. No DOAC PTA.   7/9 Heparin infusion started @ 800 units/hr 7/10 @ 0148 HL: 0.22. Level is subtherapeutic.Ordered 1000 unit heparin bolus and increased infusion rate to 900 units/hr. 7/10 @0846  HL 0.53. Therapeutic x1. Will continue current infusion rate.   Goal of Therapy:  Heparin level 0.3-0.7 units/ml Monitor platelets by anticoagulation protocol: Yes   Plan:  Continue infusion rate to 900 units/hr. Recheck anti-Xa level in 8 hours and daily while on heparin Continue to monitor H&H and platelets  Rowland Lathe, PharmD 08/26/2018,9:42 AM

## 2018-08-29 LAB — TROPONIN I (HIGH SENSITIVITY): Troponin I (High Sensitivity): 517 ng/L (ref <18)

## 2018-09-15 ENCOUNTER — Ambulatory Visit (INDEPENDENT_AMBULATORY_CARE_PROVIDER_SITE_OTHER): Payer: Medicare Other | Admitting: Vascular Surgery

## 2018-09-15 ENCOUNTER — Encounter (INDEPENDENT_AMBULATORY_CARE_PROVIDER_SITE_OTHER): Payer: Self-pay | Admitting: Vascular Surgery

## 2018-09-15 ENCOUNTER — Other Ambulatory Visit: Payer: Self-pay

## 2018-09-15 VITALS — BP 183/68 | HR 93 | Resp 12 | Ht 60.0 in | Wt 149.0 lb

## 2018-09-15 DIAGNOSIS — I6523 Occlusion and stenosis of bilateral carotid arteries: Secondary | ICD-10-CM

## 2018-09-15 DIAGNOSIS — J432 Centrilobular emphysema: Secondary | ICD-10-CM | POA: Diagnosis not present

## 2018-09-15 DIAGNOSIS — E785 Hyperlipidemia, unspecified: Secondary | ICD-10-CM | POA: Insufficient documentation

## 2018-09-15 DIAGNOSIS — I25118 Atherosclerotic heart disease of native coronary artery with other forms of angina pectoris: Secondary | ICD-10-CM | POA: Diagnosis not present

## 2018-09-15 DIAGNOSIS — E782 Mixed hyperlipidemia: Secondary | ICD-10-CM | POA: Diagnosis not present

## 2018-09-15 DIAGNOSIS — I2510 Atherosclerotic heart disease of native coronary artery without angina pectoris: Secondary | ICD-10-CM | POA: Insufficient documentation

## 2018-09-15 DIAGNOSIS — I6529 Occlusion and stenosis of unspecified carotid artery: Secondary | ICD-10-CM | POA: Insufficient documentation

## 2018-09-15 DIAGNOSIS — I10 Essential (primary) hypertension: Secondary | ICD-10-CM

## 2018-09-15 NOTE — Progress Notes (Signed)
MRN : [id number]  Anita Kent is a 78 y.o. (April 20, 1940) female who presents with chief complaint of  Chief Complaint  Patient presents with  . Follow-up  .  History of Present Illness:   The patient is seen for evaluation of carotid stenosis. The carotid stenosis was identified after a carotid bruit was noted and a duplex ultrasound obtained.  The patient denies amaurosis fugax. There is no recent history of TIA symptoms or focal motor deficits. There is no prior documented CVA.  There is no history of migraine headaches. There is no history of seizures.  The patient is taking enteric-coated aspirin 81 mg daily.  The patient has a history of coronary artery disease, no recent episodes of angina or shortness of breath. The patient denies PAD or claudication symptoms. There is a history of hyperlipidemia which is being treated with a statin.   I personally reviewed the duplex ultrasound and by my read she has a 50-69% stenosis not a greater than 70%  No outpatient medications have been marked as taking for the 09/15/18 encounter (Office Visit) with Delana Meyer, Dolores Lory, MD.    Past Medical History:  Diagnosis Date  . Asthma   . COPD (chronic obstructive pulmonary disease) ([place])   . Cough variant asthma   . Diabetes ([place])    states was told she was not diabetic tested high once on hemoglobin A1C  . Dyslipidemia   . Emphysema of lung ([place])   . endometrial cancer 08/2016   Total Hysterectomy 09/04/2016  . HTN (hypertension)   . Hypertension   . Hypothyroidism   . Thyroid disease     Past Surgical History:  Procedure Laterality Date  . ABDOMINAL HYSTERECTOMY    . CATARACT EXTRACTION, BILATERAL    . CYSTOSCOPY N/A 09/04/2016   Procedure: CYSTOSCOPY;  Surgeon: Ward, Honor Loh, MD;  Location: [facility] ORS;  Service: Gynecology;  Laterality: N/A;  . DILATATION & CURETTAGE/HYSTEROSCOPY WITH MYOSURE N/A 09/04/2016   Procedure: DILATATION & CURETTAGE/HYSTEROSCOPY WITH MYOSURE;   Surgeon: Ward, Honor Loh, MD;  Location: [facility] ORS;  Service: Gynecology;  Laterality: N/A;  . GANGLION CYST EXCISION Left    hand  . LAPAROSCOPIC BILATERAL SALPINGO OOPHERECTOMY Bilateral 09/04/2016   Procedure: LAPAROSCOPIC BILATERAL SALPINGO OOPHORECTOMY;  Surgeon: Ward, Honor Loh, MD;  Location: [facility] ORS;  Service: Gynecology;  Laterality: Bilateral;  . LAPAROSCOPIC HYSTERECTOMY  09/04/2016   Procedure: HYSTERECTOMY TOTAL LAPAROSCOPIC;  Surgeon: Ward, Honor Loh, MD;  Location: [facility] ORS;  Service: Gynecology;;  . primary open reduction procedure Right 11/09/2013    right ankle  . TONSILLECTOMY    . TUBAL LIGATION      Social History Social History   Tobacco Use  . Smoking status: Never Smoker  . Smokeless tobacco: Never Used  Substance Use Topics  . Alcohol use: No    Alcohol/week: 0.0 standard drinks  . Drug use: No    Family History Family History  Problem Relation Age of Onset  . Myasthenia gravis Daughter   . Emphysema Mother   . COPD Mother   . Dementia Mother   . Stroke Father   . Heart attack Sister   No family history of bleeding/clotting disorders, porphyria or autoimmune disease   Allergies  Allergen Reactions  . Oxycodone Itching  . Sulfa Antibiotics Hives  . Amoxicillin Hives and Rash    Has patient had a PCN reaction causing immediate rash, facial/tongue/throat swelling, SOB or lightheadedness with hypotension: Yes Has patient had a PCN reaction  causing severe rash involving mucus membranes or skin necrosis: Yes Has patient had a PCN reaction that required hospitalization: No Has patient had a PCN reaction occurring within the last 10 years: Yes If all of the above answers are "NO", then may proceed with Cephalosporin use.   Diona Fanti [Aspirin] Hives  . Levaquin [Levofloxacin In D5w] Hives     REVIEW OF SYSTEMS (Negative unless checked)  Constitutional: [] Weight loss  [] Fever  [] Chills Cardiac: [] Chest pain   [] Chest pressure   [] Palpitations    [] Shortness of breath when laying flat   [] Shortness of breath with exertion. Vascular:  [] Pain in legs with walking   [] Pain in legs at rest  [] History of DVT   [] Phlebitis   [] Swelling in legs   [] Varicose veins   [] Non-healing ulcers Pulmonary:   [] Uses home oxygen   [] Productive cough   [] Hemoptysis   [] Wheeze  [x] COPD   [] Asthma Neurologic:  [] Dizziness   [] Seizures   [] History of stroke   [] History of TIA  [] Aphasia   [] Vissual changes   [] Weakness or numbness in arm   [] Weakness or numbness in leg Musculoskeletal:   [] Joint swelling   [] Joint pain   [] Low back pain Hematologic:  [] Easy bruising  [] Easy bleeding   [] Hypercoagulable state   [] Anemic Gastrointestinal:  [] Diarrhea   [] Vomiting  [] Gastroesophageal reflux/heartburn   [] Difficulty swallowing. Genitourinary:  [] Chronic kidney disease   [] Difficult urination  [] Frequent urination   [] Blood in urine Skin:  [] Rashes   [] Ulcers  Psychological:  [] History of anxiety   []  History of major depression.  Physical Examination  Vitals:   09/15/18 1001  BP: (!) 183/68  Pulse: 93  Resp: 12  Weight: 149 lb (67.6 kg)  Height: 5' (1.524 m)   Body mass index is 29.1 kg/m. Gen: WD/WN, NAD Head: [place]/AT, No temporalis wasting.  Ear/Nose/Throat: Hearing grossly intact, nares w/o erythema or drainage, poor dentition Eyes: PER, EOMI, sclera nonicteric.  Neck: Supple, no masses.  No bruit or JVD.  Pulmonary:  Good air movement, clear to auscultation bilaterally, no use of accessory muscles.  Cardiac: RRR, normal S1, S2, no Murmurs. Vascular: bilateral carotid bruits Vessel Right Left  Radial Palpable Palpable  Brachial Palpable Palpable  Carotid Palpable Palpable  Gastrointestinal: soft, non-distended. No guarding/no peritoneal signs.  Musculoskeletal: M/S 5/5 throughout.  No deformity or atrophy.  Neurologic: CN 2-12 intact. Pain and light touch intact in extremities.  Symmetrical.  Speech is fluent. Motor exam as listed  above. Psychiatric: Judgment intact, Mood & affect appropriate for pt's clinical situation. Dermatologic: No rashes or ulcers noted.  No changes consistent with cellulitis. Lymph : No Cervical lymphadenopathy, no lichenification or skin changes of chronic lymphedema.  CBC Lab Results  Component Value Date   WBC 4.5 08/26/2018   HGB 10.6 (L) 08/26/2018   HCT 34.4 (L) 08/26/2018   MCV 86.6 08/26/2018   PLT 346 08/26/2018    BMET    Component Value Date/Time   NA 139 08/26/2018 0148   NA 141 03/27/2014 0000   NA 137 09/25/2013 1202   K 4.3 08/26/2018 0148   K 3.2 (L) 09/25/2013 1202   CL 107 08/26/2018 0148   CL 100 09/25/2013 1202   CO2 25 08/26/2018 0148   CO2 26 09/25/2013 1202   GLUCOSE 112 (H) 08/26/2018 0148   GLUCOSE 138 (H) 09/25/2013 1202   BUN 13 08/26/2018 0148   BUN 14 03/27/2014 0000   BUN 23 (H) 09/25/2013 1202  CREATININE 1.00 08/26/2018 0148   CREATININE 1.02 09/25/2013 1202   CALCIUM 9.2 08/26/2018 0148   CALCIUM 9.6 09/25/2013 1202   GFRNONAA 54 (L) 08/26/2018 0148   GFRNONAA 54 (L) 09/25/2013 1202   GFRAA >60 08/26/2018 0148   GFRAA >60 09/25/2013 1202   Estimated Creatinine Clearance: 39.1 mL/min (by C-G formula based on SCr of 1 mg/dL).  COAG Lab Results  Component Value Date   INR 1.1 08/25/2018   INR 0.8 06/26/2011    Radiology Dg Chest Portable 1 View  Result Date: 08/25/2018 CLINICAL DATA:  Tachycardia and chest pressure for the past 5 days, worse today. EXAM: PORTABLE CHEST 1 VIEW COMPARISON:  Chest radiographs dated 11/18/2017 and chest CT dated 09/24/2016. FINDINGS: Interval borderline enlargement of the cardiac silhouette. Again demonstrated is a small to moderate-sized hiatal hernia. Mild increase in prominence of the pulmonary vasculature with interval minimally prominent interstitial markings. No pleural fluid seen. Unremarkable bones. IMPRESSION: 1. Interval borderline cardiomegaly and minimal changes of congestive heart failure. 2.  Stable small to moderate-sized hiatal hernia. Electronically Signed   By: Claudie Revering M.D.   On: 08/25/2018 14:55     Assessment/Plan 1. Bilateral carotid artery stenosis Recommend:  Given the patient's asymptomatic subcritical stenosis no further invasive testing or surgery at this time.  Duplex ultrasound shows 50-69% stenosis bilaterally.  I do not feel that the left ICA is >70% and therefore I will not proceed with a CTA at this time.  This was discussed in detail with the patient.  Continue antiplatelet therapy as prescribed Continue management of CAD, HTN and Hyperlipidemia Healthy heart diet,  encouraged exercise at least 4 times per week Follow up in 6 months with duplex ultrasound and physical exam   A total of 70 minutes was spent with this patient and greater than 50% was spent in counseling and coordination of care with the patient.  Discussion included the treatment options for vascular disease including indications for surgery and intervention.  Also discussed is the appropriate timing of treatment.  In addition medical therapy was discussed.   - VAS US CAROTID; Future  2. Centrilobular emphysema (HCC) Continue pulmonary medications and aerosols as already ordered, these medications have been reviewed and there are no changes at this time.    3. Coronary artery disease of native artery of native heart with stable angina pectoris (HCC) Continue cardiac and antihypertensive medications as already ordered and reviewed, no changes at this time.  Continue statin as ordered and reviewed, no changes at this time  Nitrates PRN for chest pain   4. Mixed hyperlipidemia Continue statin as ordered and reviewed, no changes at this time   5. Essential hypertension Continue antihypertensive medications as already ordered, these medications have been reviewed and there are no changes at this time.    Hortencia Pilar, MD  09/15/2018 10:01 AM

## 2018-12-19 ENCOUNTER — Other Ambulatory Visit: Payer: Self-pay | Admitting: Physician Assistant

## 2018-12-19 DIAGNOSIS — Z1231 Encounter for screening mammogram for malignant neoplasm of breast: Secondary | ICD-10-CM

## 2018-12-30 ENCOUNTER — Ambulatory Visit: Payer: Medicare Other | Admitting: Pulmonary Disease

## 2018-12-30 ENCOUNTER — Telehealth: Payer: Self-pay | Admitting: Pulmonary Disease

## 2018-12-30 ENCOUNTER — Other Ambulatory Visit: Payer: Self-pay

## 2018-12-30 ENCOUNTER — Encounter: Payer: Self-pay | Admitting: Pulmonary Disease

## 2018-12-30 VITALS — BP 150/84 | HR 78 | Temp 98.1°F | Ht 60.0 in | Wt 154.8 lb

## 2018-12-30 DIAGNOSIS — J4550 Severe persistent asthma, uncomplicated: Secondary | ICD-10-CM

## 2018-12-30 NOTE — Patient Instructions (Signed)
1.  We will renew your prior authorization.  2.  Continue medications as previous.  We will see you back in 3 months time.  At that time we will also check blood count with differential.

## 2018-12-30 NOTE — Progress Notes (Signed)
    Assessment & Plan:  1. Severe persistent asthma ([place])   Patient Instructions  1.  We will renew your prior authorization.  2.  Continue medications as previous.  We will see you back in 3 months time.  At that time we will also check blood count with differential.  Please note: late entry documentation due to logistical difficulties during COVID-19 pandemic. This note is filed for information purposes only, and is not intended to be used for billing, nor does it represent the full scope/nature of the visit in question. Please see any associated scanned media linked to date of encounter for additional pertinent information.  Subjective:    HPI: Anita Kent is a 78 y.o. female presenting to the pulmonology clinic on 12/30/2018 with report of: Follow-up (former DS pt-needing to re apply for Nucala .  c/o sob with exertion. )     Outpatient Encounter Medications as of 12/30/2018  Medication Sig Note   albuterol  (PROVENTIL ) (2.5 MG/3ML) 0.083% nebulizer solution Take 3 mLs (2.5 mg total) by nebulization every 6 (six) hours as needed for wheezing or shortness of breath.    atorvastatin  (LIPITOR) 40 MG tablet Take 1 tablet (40 mg total) by mouth daily at 6 PM.    ibuprofen  (ADVIL ,MOTRIN ) 200 MG tablet Take 400 mg by mouth as needed for headache or moderate pain.     levothyroxine  (SYNTHROID , LEVOTHROID) 75 MCG tablet Take 75 mcg by mouth daily before breakfast.    losartan  (COZAAR ) 50 MG tablet Take 50 mg by mouth daily.    omeprazole  (PRILOSEC  OTC) 20 MG tablet Take 2 tablets (40 mg total) by mouth at bedtime.    Probiotic CAPS Take 1 capsule by mouth daily.    [DISCONTINUED] Albuterol  Sulfate (PROAIR  RESPICLICK) 108 (90 Base) MCG/ACT AEPB Inhale 2 puffs into the lungs every 6 (six) hours as needed.    [DISCONTINUED] megestrol  (MEGACE ) 40 MG tablet Take 1 tablet (40 mg total) by mouth 2 (two) times daily. 08/25/2018: Patient ran out of medication due to pandemic    [DISCONTINUED] NUCALA  100 MG/ML SOAJ Inject 100 mg into the muscle every 30 (thirty) days. 08/25/2018: Has medication at home and it's due now   [DISCONTINUED] WIXELA INHUB  250-50 MCG/DOSE AEPB INHALE 1 DOSE BY MOUTH TWICE DAILY    [DISCONTINUED] bisoprolol  (ZEBETA ) 5 MG tablet Take 1 tablet (5 mg total) by mouth daily.    [DISCONTINUED] loratadine  (CLARITIN ) 10 MG tablet Take 10 mg by mouth daily.    [DISCONTINUED] montelukast  (SINGULAIR ) 10 MG tablet Take 1 tablet (10 mg total) by mouth at bedtime. (Patient not taking: Reported on 12/30/2018)    [DISCONTINUED] nitroGLYCERIN  (NITROSTAT ) 0.4 MG SL tablet Place 1 tablet (0.4 mg total) under the tongue every 5 (five) minutes as needed for chest pain. (Patient not taking: Reported on 12/31/2021)    No facility-administered encounter medications on file as of 12/30/2018.      Objective:   Vitals:   12/30/18 1042  BP: (!) 150/84  Pulse: 78  Temp: 98.1 F (36.7 C)  Height: 5' (1.524 m)  Weight: 154 lb 12.8 oz (70.2 kg)  SpO2: 95%  TempSrc: Temporal  BMI (Calculated): 30.23     Physical exam documentation is limited by delayed entry of information.

## 2018-12-30 NOTE — Telephone Encounter (Signed)
Pt had an appointment with Dr. Patsey Berthold. Margie faxed me a letter that the pt had received stating the her Nucala PA was going to expire on 02/16/2019. Letter stated that we needed to contact [employer] at [phone] to renew PA.

## 2019-01-04 NOTE — Telephone Encounter (Signed)
Called [employer] to renew [place] PA.   CMM Key # [id number]  Given. Nucala PA initiated with CMM.   Daveda Mamaril Key: [id number] - PA Case ID: [id number] Need help? Call us at [phone]  Status  Sent to [place] 100MG /ML  [employer] Electronic Prior Authorization Form [phone] NCPDP)   Will follow up.

## 2019-01-05 NOTE — Telephone Encounter (Signed)
Fax received this afternoon from [employer]. Patient approved for continued Nucala injections 02/17/2019- 02/16/2020. Patient notified of approval.  Nothing further at this time.

## 2019-01-05 NOTE — Telephone Encounter (Signed)
Faxed received from [employer] stating Patient was approved for Nucala 02/16/2018-02/16/2019. Called [employer] PA [phone] to initiate Nucala for 2021.  Explained Nucala PA is for continuation of injection for 2021.  Predetermination information given.  Per [employer] rep information will be gone over by pharmacy for finalized determination.  Determination fax will be sent 5-7 days. Will follow up.  Case # [id number]

## 2019-01-20 ENCOUNTER — Telehealth: Payer: Self-pay | Admitting: Pulmonary Disease

## 2019-01-24 NOTE — Telephone Encounter (Signed)
Patient approved 02/17/2019 - 02/16/2020 for Nucala. Called and spoke with Nucala Rep. Colletta [place]. Patient assistance fax is being faxed to office to be completed for 2021. Per Stephanie, 2021 patient assistance is to be faxed 02/2019, and not before. Will hold application until [id number].

## 2019-01-26 NOTE — Telephone Encounter (Signed)
Fax received from [place] to [place] with 2021 Patient assistance enrollment.  Patient assistance expires 02/16/2019. Called and spoke with Patient.  Patient requested enrollment form to mail to her and she will mail back to [place] office. Nucala enrollment form placed in sealed envelope and placed in out going mail. Will follow up.

## 2019-02-13 NOTE — Telephone Encounter (Signed)
Received [facility] [place] form in mail, signed by Patient.  Enrollment needs to be completed and signed by provider. Will follow up.

## 2019-02-23 ENCOUNTER — Other Ambulatory Visit: Payer: Self-pay | Admitting: Pulmonary Disease

## 2019-03-20 ENCOUNTER — Ambulatory Visit (INDEPENDENT_AMBULATORY_CARE_PROVIDER_SITE_OTHER): Payer: Medicare Other | Admitting: Vascular Surgery

## 2019-03-20 ENCOUNTER — Encounter (INDEPENDENT_AMBULATORY_CARE_PROVIDER_SITE_OTHER): Payer: Medicare Other

## 2019-04-19 ENCOUNTER — Telehealth: Payer: Self-pay | Admitting: Pulmonary Disease

## 2019-04-19 NOTE — Telephone Encounter (Signed)
Spoke with pt and informed her that we do not do the program assistance here for NUCALA but forms will need to be filled out in order to do so. She wants to give them a call to verify and she will get back to [place].

## 2019-04-19 NOTE — Telephone Encounter (Signed)
Received a service request form from [facility] to [place] and faxed it to [place].

## 2019-04-28 IMAGING — CR DG CHEST 2V
2 series · 2 of 2 positions shown · non-contrast
Comparison: 07/21/2016 chest x-ray, CT of the chest from 09/24/2016

CLINICAL DATA: Increasing shortness of breath on exertion

EXAM:
CHEST - 2 VIEW

[chest pa]
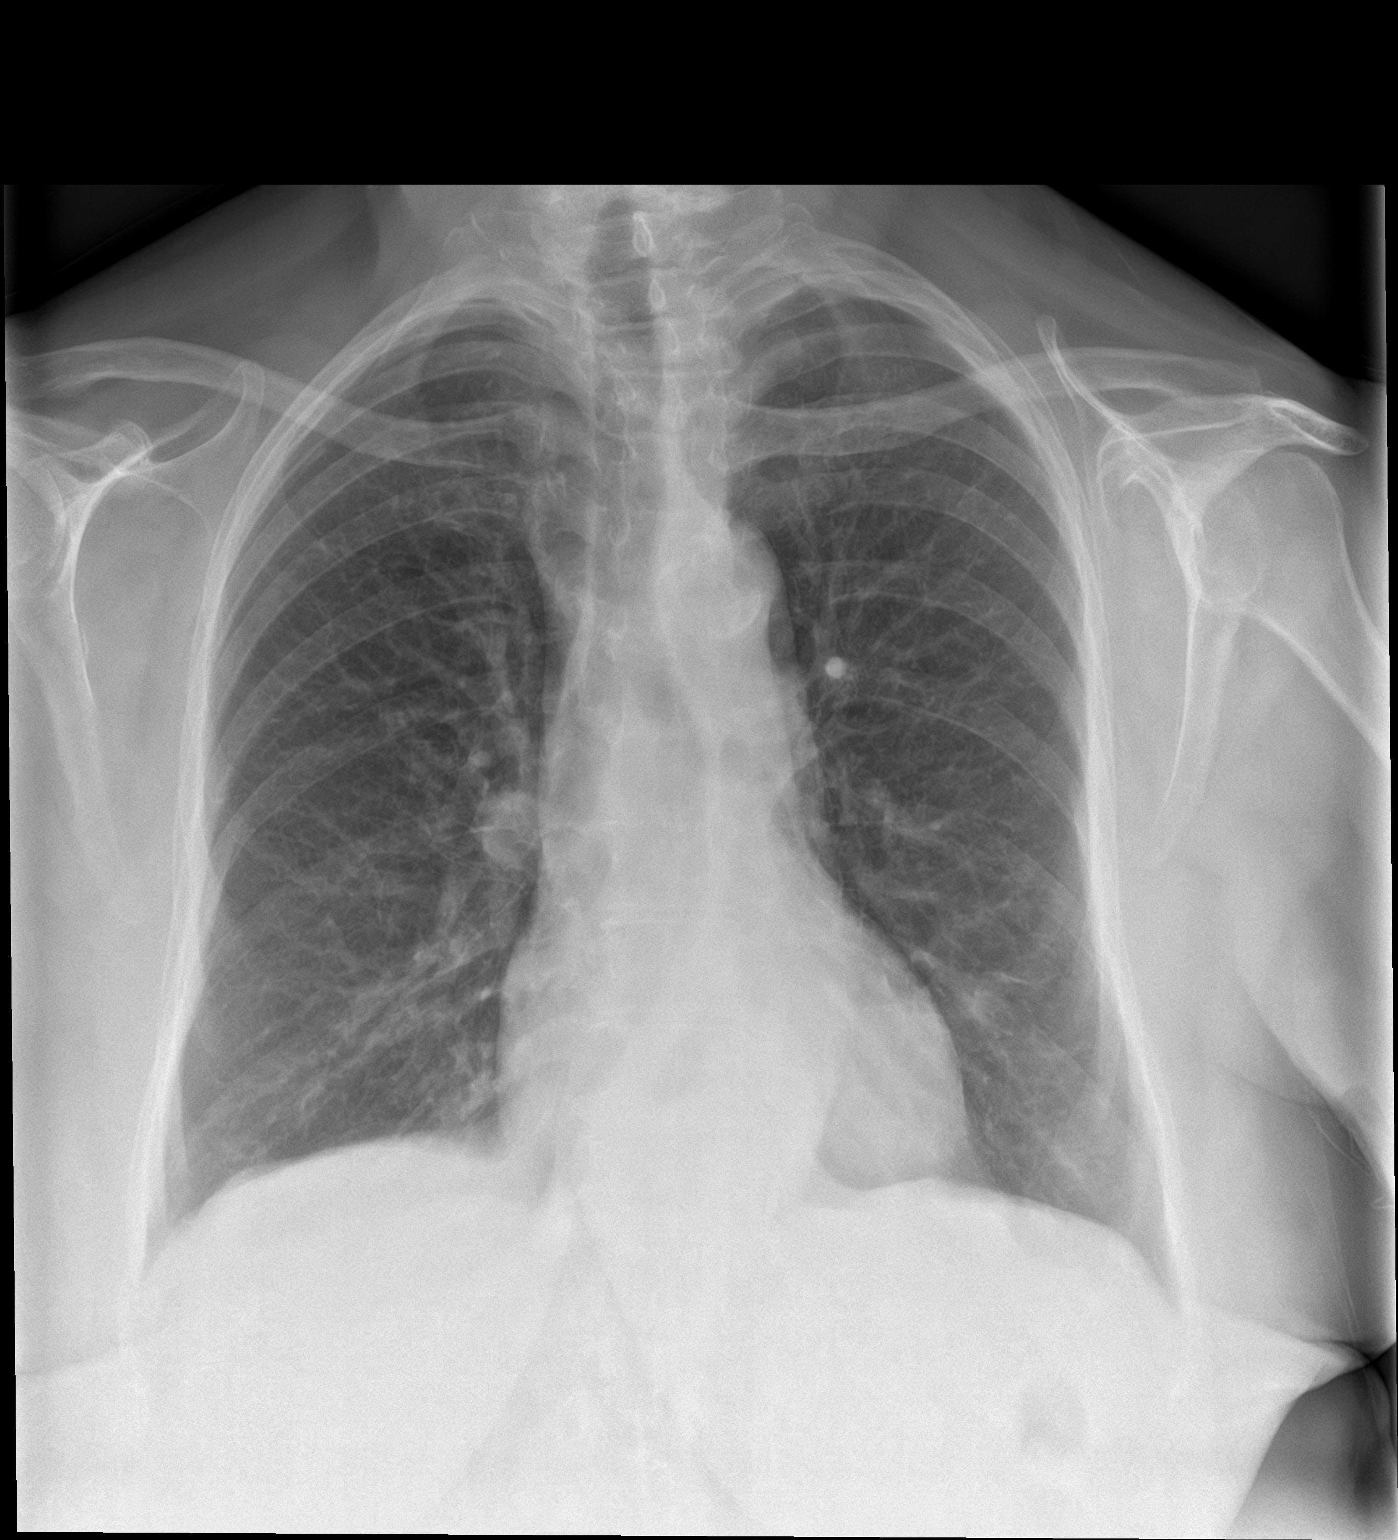

[chest lat]
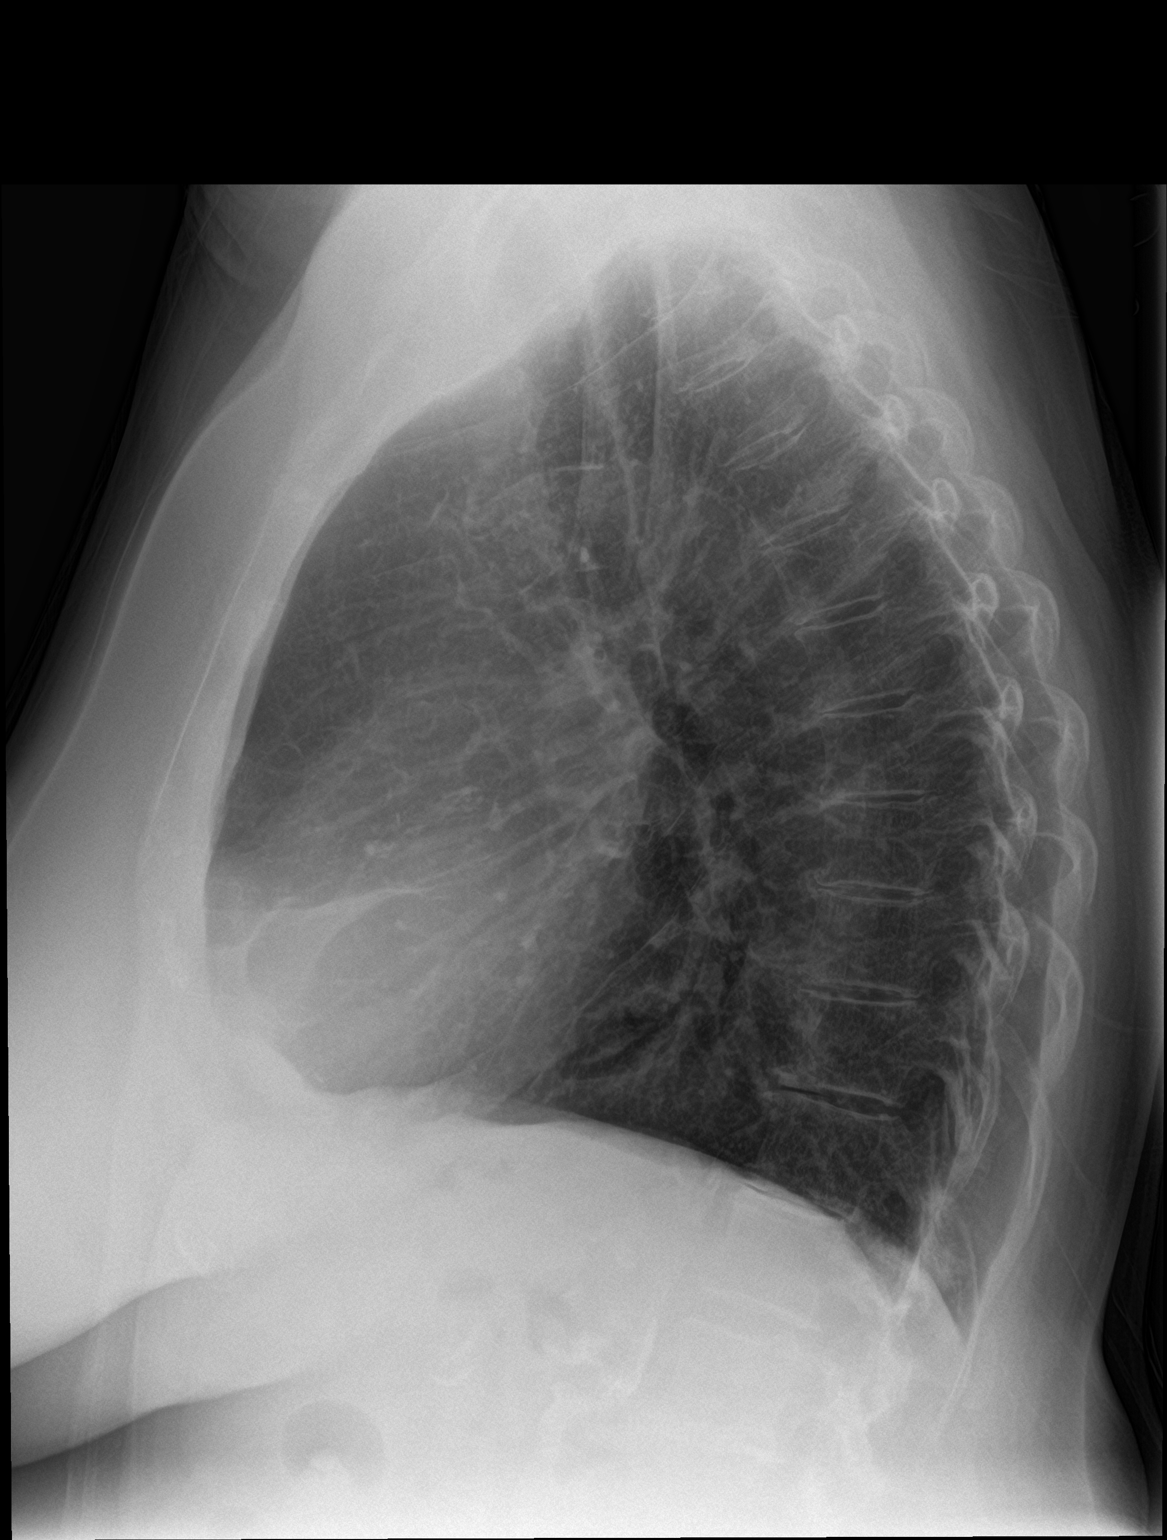

[2 of 2 positions shown; findings below may reference images not displayed]

FINDINGS: Cardiac shadows within normal limits. Aortic calcifications are
again seen. Hiatal hernia is again noted. The lungs are well aerated
bilaterally. Previously seen scarring in the left lingula/upper lobe
is again identified and stable from the prior CT. No new focal
infiltrate is seen. No acute bony abnormality is noted.
IMPRESSION: Stable left lingula/upper lobe scarring.

No acute abnormality noted.

## 2019-05-02 ENCOUNTER — Telehealth: Payer: Self-pay | Admitting: Pulmonary Disease

## 2019-05-02 NOTE — Telephone Encounter (Signed)
I called and spoke with the patient and made her aware that the [facility] office does not have any forms and neither does the [place] office. She states that she has been trying for 2 weeks to get the paperwork sent to [place] and they say they are sending it and it looks like they never have. She states that she will call them again to see if she can get the patient assistance form sent.

## 2019-05-02 NOTE — Telephone Encounter (Signed)
I do not have anything recent on the pt.

## 2019-05-02 NOTE — Telephone Encounter (Signed)
Hi. Do you have this paperwork and a status update for patient. I saw a note that it was faxed to the office earlier in the month. Please advise.

## 2019-05-09 ENCOUNTER — Telehealth: Payer: Self-pay | Admitting: Pulmonary Disease

## 2019-05-09 NOTE — Telephone Encounter (Signed)
Fax received from [place]. The [place] office, for patient assistance. Patient called office to follow up.  Patient does Nucala auto injector at home. Patient assistance with financial info faxed to [facility] to [place].  Patient home # [phone] Cell # [phone]

## 2019-05-18 NOTE — Telephone Encounter (Signed)
Pt called back regarding this. The fax was received but nothing has been done-- pt wants to know if there's anything else that can be done to move process along.

## 2019-05-23 NOTE — Telephone Encounter (Signed)
Page was scanned into Epic but is missing MD signature. Sent message to [facility] staff for this week to print, obtain signature and to fax. Will follow up.

## 2019-05-24 ENCOUNTER — Telehealth: Payer: Self-pay | Admitting: Pulmonary Disease

## 2019-05-24 NOTE — Telephone Encounter (Signed)
Pt calling back regarding form so she can get her next injection.  Please advise on status.  [phone] or [phone].

## 2019-05-24 NOTE — Telephone Encounter (Signed)
The forms have been reprinted and signed by Dr. Darnell Level and faxed to [facility] to [place] at [phone]. I will also give copy to pharmacy team as well. Nothing further needed at this time.

## 2019-05-24 NOTE — Telephone Encounter (Signed)
We have the form here in [place] and will have Dr. Patsey Berthold sign it and it will be faxed back. I called and spoke with the patient and made her aware of the update. She verbalized understanding. Nothing further is needed

## 2019-05-31 ENCOUNTER — Telehealth: Payer: Self-pay | Admitting: Pulmonary Disease

## 2019-05-31 NOTE — Telephone Encounter (Signed)
Received fax dated 05/30/19 from [place] to [employer]. Patient has been approved to their patient assistance program for 1 year to received Nucala free of charge. Program rep will be reaching out to office to schedule shipment.  Phone# [phone]  2:35 PM Beatriz Chancellor, CPhT

## 2019-05-31 NOTE — Telephone Encounter (Signed)
Pt gives her injection at home. I have left a message with pt making her aware of the approval so she may schedule her shipment to her home.

## 2019-06-01 ENCOUNTER — Other Ambulatory Visit: Payer: Self-pay

## 2019-06-01 ENCOUNTER — Telehealth: Payer: Self-pay | Admitting: Pulmonary Disease

## 2019-06-01 MED ORDER — FLUTICASONE-SALMETEROL 250-50 MCG/DOSE IN AEPB: INHALATION_SPRAY | RESPIRATORY_TRACT | 10 refills | Status: DC

## 2019-06-01 MED ORDER — NUCALA 100 MG/ML ~~LOC~~ SOAJ: 100.0000 mg | SUBCUTANEOUS | 12 refills | Status: DC

## 2019-06-01 NOTE — Telephone Encounter (Signed)
Spoke with Ria Comment who is working in injection area today and she is going to take care of the prescription for pt's nucala.

## 2019-06-01 NOTE — Telephone Encounter (Signed)
Spoke with pt. She is aware that her prescription has been sent to [facility]. Nothing further was needed.

## 2019-06-01 NOTE — Telephone Encounter (Signed)
Ok, thanks [place] and [place]!

## 2019-06-01 NOTE — Telephone Encounter (Signed)
Called and spoke with pt letting her know that her nucala has been approved for patient assistance and she should be able to call to receive her shipment.  While speaking with pt, pt stated she spoke with a rep from [facility] at [phone] who stated that they were still awaiting a prescription order for her Nucala so they can be able to get her Nucala to her.  Routing this to both injection pool and pharmacy team for assistance with this. Please advise.

## 2019-07-04 ENCOUNTER — Emergency Department
Admission: EM | Admit: 2019-07-04 | Discharge: 2019-07-04 | Disposition: A | Discharge status: Home / Self Care | Payer: Medicare Other | Attending: Emergency Medicine | Admitting: Emergency Medicine

## 2019-07-04 ENCOUNTER — Emergency Department: Payer: Medicare Other

## 2019-07-04 ENCOUNTER — Other Ambulatory Visit: Payer: Self-pay

## 2019-07-04 DIAGNOSIS — I2510 Atherosclerotic heart disease of native coronary artery without angina pectoris: Secondary | ICD-10-CM | POA: Diagnosis not present

## 2019-07-04 DIAGNOSIS — E119 Type 2 diabetes mellitus without complications: Secondary | ICD-10-CM | POA: Diagnosis not present

## 2019-07-04 DIAGNOSIS — M791 Myalgia, unspecified site: Secondary | ICD-10-CM | POA: Diagnosis present

## 2019-07-04 DIAGNOSIS — Z79899 Other long term (current) drug therapy: Secondary | ICD-10-CM | POA: Diagnosis not present

## 2019-07-04 DIAGNOSIS — Z7984 Long term (current) use of oral hypoglycemic drugs: Secondary | ICD-10-CM | POA: Diagnosis not present

## 2019-07-04 DIAGNOSIS — E039 Hypothyroidism, unspecified: Secondary | ICD-10-CM | POA: Insufficient documentation

## 2019-07-04 DIAGNOSIS — Z20822 Contact with and (suspected) exposure to covid-19: Secondary | ICD-10-CM | POA: Diagnosis not present

## 2019-07-04 DIAGNOSIS — R6883 Chills (without fever): Secondary | ICD-10-CM

## 2019-07-04 DIAGNOSIS — I10 Essential (primary) hypertension: Secondary | ICD-10-CM | POA: Diagnosis not present

## 2019-07-04 DIAGNOSIS — J449 Chronic obstructive pulmonary disease, unspecified: Secondary | ICD-10-CM | POA: Diagnosis not present

## 2019-07-04 DIAGNOSIS — N3000 Acute cystitis without hematuria: Secondary | ICD-10-CM | POA: Insufficient documentation

## 2019-07-04 LAB — COMPREHENSIVE METABOLIC PANEL
ALT: 19 U/L (ref 0–44)
AST: 20 U/L (ref 15–41)
Albumin: 3.6 g/dL (ref 3.5–5.0)
Alkaline Phosphatase: 95 U/L (ref 38–126)
Anion gap: 8 (ref 5–15)
BUN: 17 mg/dL (ref 8–23)
CO2: 23 mmol/L (ref 22–32)
Calcium: 9.1 mg/dL (ref 8.9–10.3)
Chloride: 105 mmol/L (ref 98–111)
Creatinine, Ser: 0.72 mg/dL (ref 0.44–1.00)
GFR calc Af Amer: >60 mL/min (ref >60)
GFR calc non Af Amer: >60 mL/min (ref >60)
Glucose, Bld: 123 mg/dL — ABNORMAL HIGH (ref 70–99)
Potassium: 4.3 mmol/L (ref 3.5–5.1)
Sodium: 136 mmol/L (ref 135–145)
Total Bilirubin: 0.7 mg/dL (ref 0.3–1.2)
Total Protein: 6.5 g/dL (ref 6.5–8.1)

## 2019-07-04 LAB — LACTIC ACID, PLASMA: Lactic Acid, Venous: 1.2 mmol/L (ref 0.5–1.9)

## 2019-07-04 LAB — URINALYSIS, COMPLETE (UACMP) WITH MICROSCOPIC
Bacteria, UA: NONE SEEN
Bilirubin Urine: NEGATIVE
Glucose, UA: NEGATIVE mg/dL
Hgb urine dipstick: NEGATIVE
Ketones, ur: NEGATIVE mg/dL
Nitrite: NEGATIVE
Protein, ur: NEGATIVE mg/dL
Specific Gravity, Urine: 1.012 (ref 1.005–1.030)
Squamous Epithelial / HPF: NONE SEEN (ref 0–5)
WBC, UA: >50 WBC/hpf — ABNORMAL HIGH (ref 0–5)
pH: 6 (ref 5.0–8.0)

## 2019-07-04 LAB — LIPASE, BLOOD: Lipase: 41 U/L (ref 11–51)

## 2019-07-04 LAB — CBC
HCT: 33.8 % — ABNORMAL LOW (ref 36.0–46.0)
Hemoglobin: 10.4 g/dL — ABNORMAL LOW (ref 12.0–15.0)
MCH: 26 pg (ref 26.0–34.0)
MCHC: 30.8 g/dL (ref 30.0–36.0)
MCV: 84.5 fL (ref 80.0–100.0)
Platelets: 337 10*3/uL (ref 150–400)
RBC: 4 MIL/uL (ref 3.87–5.11)
RDW: 18.1 % — ABNORMAL HIGH (ref 11.5–15.5)
WBC: 9.8 10*3/uL (ref 4.0–10.5)
nRBC: 0 % (ref 0.0–0.2)

## 2019-07-04 MED ORDER — AZITHROMYCIN 250 MG PO TABS: ORAL_TABLET | ORAL | 0 refills | Status: AC

## 2019-07-04 MED ORDER — CEFPODOXIME PROXETIL 200 MG PO TABS: 200.0000 mg | ORAL_TABLET | Freq: Two times a day (BID) | ORAL | 0 refills | Status: AC

## 2019-07-04 MED ORDER — SODIUM CHLORIDE 0.9 % IV SOLN
1.0000 g | Freq: Once | INTRAVENOUS | Status: AC
  Administered 2019-07-04: 1 g via INTRAVENOUS
  Filled 2019-07-04: qty 10

## 2019-07-04 MED ORDER — CEPHALEXIN 500 MG PO CAPS: 500.0000 mg | ORAL_CAPSULE | Freq: Two times a day (BID) | ORAL | 0 refills | Status: DC

## 2019-07-04 MED ORDER — SODIUM CHLORIDE 0.9% FLUSH
3.0000 mL | Freq: Once | INTRAVENOUS | Status: AC
  Administered 2019-07-04: 3 mL via INTRAVENOUS

## 2019-07-04 NOTE — ED Triage Notes (Signed)
Pt in via [facility] from home with c/o chills and bodyaches. Pt has had both COVID vaccines. Pt lso with increase SOB and hx of asthma, 92%RA placed on [place] at 3L and increased O2 sats. Pt also had 3 rounds of loose stool

## 2019-07-04 NOTE — ED Notes (Signed)
Pt denies itchiness, breathing reg/unlabored, pt relaxed in bed. Call bell within reach. Rail up. Bed locked low.

## 2019-07-04 NOTE — ED Provider Notes (Signed)
[facility] Emergency Department Provider Note   ____________________________________________   First MD Initiated Contact with Patient 07/04/19 1202     (approximate)  I have reviewed the triage vital signs and the nursing notes.   HISTORY  Chief Complaint Emesis and Diarrhea    HPI Anita Kent is a 79 y.o. female with past medical history of COPD, CAD, hypertension, and hyperlipidemia who presents to the ED complaining of chills and body aches.  Patient states that 3 to 4 days ago she first started noticing chills with diffuse body aches and shaking.  She felt worse earlier today with increased shaking, also had multiple episodes of diarrhea and one episode of vomiting.  She denies any associated abdominal pain, does report having urinary frequency at the end of last week, but denies any dysuria or hematuria.  She has been taking her temperature at home and has not noted any fevers.  She is not aware of any sick contacts and completed her second dose of the COVID-19 vaccine in March.  She reports having some mild shortness of breath upon arrival to the ED, but attributes this to her COPD and now states that her breathing is back to her usual baseline.  She denies any chest pain or cough.        Past Medical History:  Diagnosis Date  . Asthma   . COPD (chronic obstructive pulmonary disease) ([place])   . Cough variant asthma   . Diabetes ([place])    states was told she was not diabetic tested high once on hemoglobin A1C  . Dyslipidemia   . Emphysema of lung ([place])   . endometrial cancer 08/2016   Total Hysterectomy 09/04/2016  . HTN (hypertension)   . Hypertension   . Hypothyroidism   . Thyroid disease     Patient Active Problem List   Diagnosis Date Noted  . Carotid stenosis 09/15/2018  . CAD (coronary artery disease) 09/15/2018  . Hyperlipidemia 09/15/2018  . Essential hypertension 09/15/2018  . Chest pain 08/25/2018  . Endometrial cancer, FIGO  stage IIIA ([place]) 09/16/2016  . Sinusitis, acute 08/13/2014  . Emphysema lung ([place]) 05/08/2014  . Asthma, chronic 03/06/2014  . Cough 03/06/2014    Past Surgical History:  Procedure Laterality Date  . ABDOMINAL HYSTERECTOMY    . CATARACT EXTRACTION, BILATERAL    . CYSTOSCOPY N/A 09/04/2016   Procedure: CYSTOSCOPY;  Surgeon: Ward, Honor Loh, MD;  Location: [facility] ORS;  Service: Gynecology;  Laterality: N/A;  . DILATATION & CURETTAGE/HYSTEROSCOPY WITH MYOSURE N/A 09/04/2016   Procedure: DILATATION & CURETTAGE/HYSTEROSCOPY WITH MYOSURE;  Surgeon: Ward, Honor Loh, MD;  Location: [facility] ORS;  Service: Gynecology;  Laterality: N/A;  . GANGLION CYST EXCISION Left    hand  . LAPAROSCOPIC BILATERAL SALPINGO OOPHERECTOMY Bilateral 09/04/2016   Procedure: LAPAROSCOPIC BILATERAL SALPINGO OOPHORECTOMY;  Surgeon: Ward, Honor Loh, MD;  Location: [facility] ORS;  Service: Gynecology;  Laterality: Bilateral;  . LAPAROSCOPIC HYSTERECTOMY  09/04/2016   Procedure: HYSTERECTOMY TOTAL LAPAROSCOPIC;  Surgeon: Ward, Honor Loh, MD;  Location: [facility] ORS;  Service: Gynecology;;  . primary open reduction procedure Right 11/09/2013    right ankle  . TONSILLECTOMY    . TUBAL LIGATION      Prior to Admission medications   Medication Sig Start Date End Date Taking? Authorizing Provider  albuterol (PROVENTIL) (2.5 MG/3ML) 0.083% nebulizer solution Take 3 mLs (2.5 mg total) by nebulization every 6 (six) hours as needed for wheezing or shortness of breath. 01/25/18   Simonds,  Zella Richer, MD  Albuterol Sulfate (PROAIR RESPICLICK) [id number] (90 Base) MCG/ACT AEPB Inhale 2 puffs into the lungs every 6 (six) hours as needed. 11/23/17   Wilhelmina Mcardle, MD  atorvastatin (LIPITOR) 40 MG tablet Take 1 tablet (40 mg total) by mouth daily at 6 PM. 08/26/18   Bettey Costa, MD  azithromycin (ZITHROMAX Z-PAK) 250 MG tablet Take 2 tablets (500 mg) on  Day 1,  followed by 1 tablet (250 mg) once daily on Days 2 through 5. 07/04/19 07/09/19  Blake Divine, MD   cefpodoxime (VANTIN) 200 MG tablet Take 1 tablet (200 mg total) by mouth 2 (two) times daily for 7 days. 07/04/19 07/11/19  Blake Divine, MD  Fluticasone-Salmeterol [facility] INHUB) 250-50 MCG/DOSE AEPB INHALE 1 DOSE BY MOUTH TWICE DAILY 06/01/19   Tyler Pita, MD  ibuprofen (ADVIL,MOTRIN) 200 MG tablet Take 400 mg by mouth as needed for headache or moderate pain.     [provider]  levothyroxine (SYNTHROID, LEVOTHROID) 75 MCG tablet Take 75 mcg by mouth daily before breakfast.    [provider]  losartan (COZAAR) 50 MG tablet Take 50 mg by mouth daily.    [provider]  megestrol (MEGACE) 40 MG tablet Take 1 tablet (40 mg total) by mouth 2 (two) times daily. 12/16/16   Mellody Drown, MD  nitroGLYCERIN (NITROSTAT) 0.4 MG SL tablet Place 1 tablet (0.4 mg total) under the tongue every 5 (five) minutes as needed for chest pain. 08/26/18   Bettey Costa, MD  NUCALA 100 MG/ML SOAJ Inject 100 mg into the skin every 28 (twenty-eight) days. 06/01/19   Tyler Pita, MD  omeprazole (PRILOSEC OTC) 20 MG tablet Take 2 tablets (40 mg total) by mouth at bedtime. 11/01/17   Wilhelmina Mcardle, MD  Probiotic CAPS Take 1 capsule by mouth daily.    [provider]    Allergies Oxycodone, Sulfa antibiotics, Amoxicillin, and Levaquin [levofloxacin in d5w]  Family History  Problem Relation Age of Onset  . Myasthenia gravis Daughter   . Emphysema Mother   . COPD Mother   . Dementia Mother   . Stroke Father   . Heart attack Sister     Social History Social History   Tobacco Use  . Smoking status: Never Smoker  . Smokeless tobacco: Never Used  Substance Use Topics  . Alcohol use: No    Alcohol/week: 0.0 standard drinks  . Drug use: No    Review of Systems  Constitutional: Negative for fever, positive for chills. Eyes: No visual changes. ENT: No sore throat. Cardiovascular: Denies chest pain. Respiratory: Denies shortness of  breath. Gastrointestinal: No abdominal pain.  Positive for nausea, vomiting, and diarrhea.  No constipation. Genitourinary: Negative for dysuria.  Positive for urinary frequency. Musculoskeletal: Negative for back pain. Skin: Negative for rash. Neurological: Negative for headaches, focal weakness or numbness.  ____________________________________________   PHYSICAL EXAM:  VITAL SIGNS: ED Triage Vitals [07/04/19 0848]  Enc Vitals Group     BP (!) 126/43     Pulse Rate 87     Resp 18     Temp 98.3 F (36.8 C)     Temp Source Oral     SpO2 98 %     Weight 155 lb (70.3 kg)     Height 5' (1.524 m)     Head Circumference      Peak Flow      Pain Score 0     Pain Loc  Pain Edu?      Excl. in [place]?     Constitutional: Alert and oriented. Eyes: Conjunctivae are normal. Head: Atraumatic. Nose: No congestion/rhinnorhea. Mouth/Throat: Mucous membranes are moist. Neck: Normal ROM Cardiovascular: Normal rate, regular rhythm. Grossly normal heart sounds. Respiratory: Normal respiratory effort.  No retractions. Lungs CTAB. Gastrointestinal: Soft and nontender. No distention. Genitourinary: deferred Musculoskeletal: No lower extremity tenderness nor edema. Neurologic:  Normal speech and language. No gross focal neurologic deficits are appreciated. Skin:  Skin is warm, dry and intact. No rash noted. Psychiatric: Mood and affect are normal. Speech and behavior are normal.  ____________________________________________   LABS (all labs ordered are listed, but only abnormal results are displayed)  Labs Reviewed  COMPREHENSIVE METABOLIC PANEL - Abnormal; Notable for the following components:      Result Value   Glucose, Bld 123 (*)    All other components within normal limits  CBC - Abnormal; Notable for the following components:   Hemoglobin 10.4 (*)    HCT 33.8 (*)    RDW 18.1 (*)    All other components within normal limits  URINALYSIS, COMPLETE (UACMP) WITH MICROSCOPIC -  Abnormal; Notable for the following components:   Color, Urine STRAW (*)    APPearance HAZY (*)    Leukocytes,Ua LARGE (*)    WBC, UA >50 (*)    All other components within normal limits  SARS CORONAVIRUS 2 (TAT 6-24 HRS)  URINE CULTURE  LIPASE, BLOOD  LACTIC ACID, PLASMA   ____________________________________________  EKG  ED ECG REPORT I, Blake Divine, the attending physician, personally viewed and interpreted this ECG.   Date: 07/04/2019  EKG Time: 12:21  Rate: 96  Rhythm: normal sinus rhythm  Axis: LAD  Intervals:left bundle branch block  ST&T Change: None   PROCEDURES  Procedure(s) performed (including Critical Care):  Procedures   ____________________________________________   INITIAL IMPRESSION / ASSESSMENT AND PLAN / ED COURSE       79 year old female with history of COPD, CAD, hypertension, and hyperlipidemia presents to the ED with a few days of chills and body aches, noticed some urinary frequency prior to onset of the symptoms.  She did briefly have some shortness of breath upon arrival, but this has since resolved and she does not have any significant wheezing on exam.  UA suggests infection and given her urinary frequency, UTI is likely a cause of her symptoms.  We will culture urine and give initial dose of antibiotics here in the ED.  She reports an allergy to penicillins, sulfa drugs, and fluoroquinolones which unfortunately limits our antibiotic options.  She has not previously taken cephalosporins and given relatively mild reaction described as a rash and hives, benefits of cephalosporins seem to outweigh risks and we will trial dose of Rocephin here in the ED.  We will also perform testing for COVID-19, check chest x-ray for any evidence of pneumonia.  If remainder of work-up is unremarkable and patient remained stable, she would be appropriate for discharge home on antibiotics for UTI.  Chest x-ray significant for what appears to be multifocal  pneumonia, potentially representing COVID-19.  Patient remained stable from a respiratory perspective, denies any ongoing shortness of breath and is maintaining O2 sats on room air.  Given these findings, we will cover for pneumonia as well as UTI with Cefpodoxime, also prescribed azithromycin.  COVID-19 testing is pending, patient counseled to follow closely with her PCP and to return to the ED for new or worsening symptoms.  Patient agrees with  plan.      ____________________________________________   FINAL CLINICAL IMPRESSION(S) / ED DIAGNOSES  Final diagnoses:  Acute cystitis without hematuria  Chills  Suspected COVID-19 virus infection     ED Discharge Orders         Ordered    cephALEXin (KEFLEX) 500 MG capsule  2 times daily,   Status:  Discontinued     07/04/19 1419    cefpodoxime (VANTIN) 200 MG tablet  2 times daily     07/04/19 1426    azithromycin (ZITHROMAX Z-PAK) 250 MG tablet     07/04/19 1427           Note:  This document was prepared using Dragon voice recognition software and may include unintentional dictation errors.   Blake Divine, MD 07/04/19 1427

## 2019-07-04 NOTE — ED Triage Notes (Signed)
Pt comes into the ED via EMS from home with c/o chills since Sunday and today having N/V/D with bodyaches as well. Pt is in NAD at present.

## 2019-07-04 NOTE — ED Notes (Signed)
Pt wheeled out to lobby with ride on the way.

## 2019-07-04 NOTE — ED Notes (Signed)
Pt given more warm blankets and cup of water with verbal okay from [place].

## 2019-07-04 NOTE — ED Notes (Addendum)
Pt up to bedside toilet. Will place IV once pt back in bed. Pt reports history of asthma. Pt SOB upon exertion.

## 2019-07-05 LAB — SARS CORONAVIRUS 2 (TAT 6-24 HRS): SARS Coronavirus 2: NEGATIVE

## 2019-07-05 LAB — URINE CULTURE: Culture: <10000 — AB

## 2019-08-29 ENCOUNTER — Ambulatory Visit
Admission: EM | Admit: 2019-08-29 | Discharge: 2019-08-29 | Disposition: A | Discharge status: Home / Self Care | Payer: Medicare Other | Attending: Family Medicine | Admitting: Family Medicine

## 2019-08-29 DIAGNOSIS — M545 Low back pain, unspecified: Secondary | ICD-10-CM

## 2019-08-29 MED ORDER — PREDNISONE 10 MG PO TABS: ORAL_TABLET | ORAL | 0 refills | Status: DC

## 2019-08-29 MED ORDER — TRAMADOL HCL 50 MG PO TABS: 50.0000 mg | ORAL_TABLET | Freq: Two times a day (BID) | ORAL | 0 refills | Status: DC | PRN

## 2019-08-29 NOTE — ED Triage Notes (Signed)
Pt presents to UC for right hip pain x3 days. Pt denies recent fall or trauma to right hip. Pt states ambulation is painful, and has limited range of motion r/t pain. Pt has been treating with ice, and ibuprofen with out relief.

## 2019-08-29 NOTE — ED Provider Notes (Signed)
[facility]    CSN: [id number] Arrival date & time: 08/29/19  1200      History   Chief Complaint Chief Complaint  Patient presents with  . Hip Pain   HPI  79 year old female presents with the above complaint.  Patient reports that she has had right-sided hip pain for the past 3 days.  She localizes the pain to the right sacral region and buttock.  Denies groin pain.  No fall, trauma, injury.  Patient reports pain with ambulation.  He has been taking ibuprofen with some relief.  No reports of numbness and tingling in her right lower extremity.  No other associated symptoms.  No other complaints.  Past Medical History:  Diagnosis Date  . Asthma   . COPD (chronic obstructive pulmonary disease) ([place])   . Cough variant asthma   . Diabetes ([place])    states was told she was not diabetic tested high once on hemoglobin A1C  . Dyslipidemia   . Emphysema of lung ([place])   . endometrial cancer 08/2016   Total Hysterectomy 09/04/2016  . HTN (hypertension)   . Hypertension   . Hypothyroidism   . Thyroid disease     Patient Active Problem List   Diagnosis Date Noted  . Carotid stenosis 09/15/2018  . CAD (coronary artery disease) 09/15/2018  . Hyperlipidemia 09/15/2018  . Essential hypertension 09/15/2018  . Chest pain 08/25/2018  . Endometrial cancer, FIGO stage IIIA ([place]) 09/16/2016  . Sinusitis, acute 08/13/2014  . Emphysema lung ([place]) 05/08/2014  . Asthma, chronic 03/06/2014  . Cough 03/06/2014    Past Surgical History:  Procedure Laterality Date  . ABDOMINAL HYSTERECTOMY    . CATARACT EXTRACTION, BILATERAL    . CYSTOSCOPY N/A 09/04/2016   Procedure: CYSTOSCOPY;  Surgeon: Ward, Honor Loh, MD;  Location: [facility] ORS;  Service: Gynecology;  Laterality: N/A;  . DILATATION & CURETTAGE/HYSTEROSCOPY WITH MYOSURE N/A 09/04/2016   Procedure: DILATATION & CURETTAGE/HYSTEROSCOPY WITH MYOSURE;  Surgeon: Ward, Honor Loh, MD;  Location: [facility] ORS;  Service: Gynecology;   Laterality: N/A;  . GANGLION CYST EXCISION Left    hand  . LAPAROSCOPIC BILATERAL SALPINGO OOPHERECTOMY Bilateral 09/04/2016   Procedure: LAPAROSCOPIC BILATERAL SALPINGO OOPHORECTOMY;  Surgeon: Ward, Honor Loh, MD;  Location: [facility] ORS;  Service: Gynecology;  Laterality: Bilateral;  . LAPAROSCOPIC HYSTERECTOMY  09/04/2016   Procedure: HYSTERECTOMY TOTAL LAPAROSCOPIC;  Surgeon: Ward, Honor Loh, MD;  Location: [facility] ORS;  Service: Gynecology;;  . primary open reduction procedure Right 11/09/2013    right ankle  . TONSILLECTOMY    . TUBAL LIGATION      OB History   No obstetric history on file.      Home Medications    Prior to Admission medications   Medication Sig Start Date End Date Taking? Authorizing Provider  albuterol (PROVENTIL) (2.5 MG/3ML) 0.083% nebulizer solution Take 3 mLs (2.5 mg total) by nebulization every 6 (six) hours as needed for wheezing or shortness of breath. 01/25/18   Wilhelmina Mcardle, MD  Albuterol Sulfate (PROAIR RESPICLICK) 017 (90 Base) MCG/ACT AEPB Inhale 2 puffs into the lungs every 6 (six) hours as needed. 11/23/17   Wilhelmina Mcardle, MD  atorvastatin (LIPITOR) 40 MG tablet Take 1 tablet (40 mg total) by mouth daily at 6 PM. 08/26/18   Bettey Costa, MD  Fluticasone-Salmeterol [facility] INHUB) 250-50 MCG/DOSE AEPB INHALE 1 DOSE BY MOUTH TWICE DAILY 06/01/19   Tyler Pita, MD  ibuprofen (ADVIL,MOTRIN) 200 MG tablet Take 400 mg by mouth as  needed for headache or moderate pain.     [provider]  levothyroxine (SYNTHROID, LEVOTHROID) 75 MCG tablet Take 75 mcg by mouth daily before breakfast.    [provider]  losartan (COZAAR) 50 MG tablet Take 50 mg by mouth daily.    [provider]  megestrol (MEGACE) 40 MG tablet Take 1 tablet (40 mg total) by mouth 2 (two) times daily. 12/16/16   Mellody Drown, MD  nitroGLYCERIN (NITROSTAT) 0.4 MG SL tablet Place 1 tablet (0.4 mg total) under the tongue every 5 (five) minutes as needed for  chest pain. 08/26/18   Bettey Costa, MD  NUCALA 100 MG/ML SOAJ Inject 100 mg into the skin every 28 (twenty-eight) days. 06/01/19   Tyler Pita, MD  omeprazole (PRILOSEC OTC) 20 MG tablet Take 2 tablets (40 mg total) by mouth at bedtime. 11/01/17   Wilhelmina Mcardle, MD  predniSONE (DELTASONE) 10 MG tablet 50 mg daily x 2 days, then 40 mg daily x 2 days, then 30 mg daily x 2 days, then 20 mg daily x 2 days, then 10 mg daily x 2 days. 08/29/19   Coral Spikes, DO  Probiotic CAPS Take 1 capsule by mouth daily.    [provider]  traMADol (ULTRAM) 50 MG tablet Take 1 tablet (50 mg total) by mouth every 12 (twelve) hours as needed for moderate pain or severe pain. 08/29/19   Coral Spikes, DO    Family History Family History  Problem Relation Age of Onset  . Myasthenia gravis Daughter   . Emphysema Mother   . COPD Mother   . Dementia Mother   . Stroke Father   . Heart attack Sister     Social History Social History   Tobacco Use  . Smoking status: Never Smoker  . Smokeless tobacco: Never Used  Vaping Use  . Vaping Use: Never used  Substance Use Topics  . Alcohol use: No    Alcohol/week: 0.0 standard drinks  . Drug use: No     Allergies   Oxycodone, Sulfa antibiotics, Amoxicillin, and Levaquin [levofloxacin in d5w]   Review of Systems Review of Systems  Musculoskeletal:       Hip pain.  Neurological: Negative.    Physical Exam Triage Vital Signs ED Triage Vitals  Enc Vitals Group     BP 08/29/19 1219 128/68     Pulse Rate 08/29/19 1219 73     Resp 08/29/19 1219 16     Temp 08/29/19 1219 98 F (36.7 C)     Temp Source 08/29/19 1219 Oral     SpO2 08/29/19 1219 99 %     Weight --      Height --      Head Circumference --      Peak Flow --      Pain Score 08/29/19 1221 8     Pain Loc --      Pain Edu? --      Excl. in [place]? --    Updated Vital Signs BP 128/68 (BP Location: Left Arm)   Pulse 73   Temp 98 F (36.7 C) (Oral)   Resp 16   SpO2 99%    Visual Acuity Right Eye Distance:   Left Eye Distance:   Bilateral Distance:    Right Eye Near:   Left Eye Near:    Bilateral Near:     Physical Exam Vitals and nursing note reviewed.  Constitutional:      General:  She is not in acute distress.    Appearance: Normal appearance. She is not ill-appearing.  HENT:     Head: Normocephalic and atraumatic.  Eyes:     General:        Right eye: No discharge.        Left eye: No discharge.     Conjunctiva/sclera: Conjunctivae normal.  Cardiovascular:     Rate and Rhythm: Normal rate and regular rhythm.  Pulmonary:     Effort: Pulmonary effort is normal.     Breath sounds: Normal breath sounds. No wheezing, rhonchi or rales.  Musculoskeletal:     Comments: Full range of motion of the right hip. Negative FADIR & FABER.   Neurological:     Mental Status: She is alert.  Psychiatric:        Mood and Affect: Mood normal.        Behavior: Behavior normal.    UC Treatments / Results  Labs (all labs ordered are listed, but only abnormal results are displayed) Labs Reviewed - No data to display  EKG   Radiology No results found.  Procedures Procedures (including critical care time)  Medications Ordered in UC Medications - No data to display  Initial Impression / Assessment and Plan / UC Course  I have reviewed the triage vital signs and the nursing notes.  Pertinent labs & imaging results that were available during my care of the patient were reviewed by me and considered in my medical decision making (see chart for details).    79 year old female presents with reports of right hip pain.  Patient has good range of motion of the hip and no groin pain.  I suspect that her pain is secondary to back pain.  Treating with brief course of prednisone.  Tramadol as needed.  Final Clinical Impressions(s) / UC Diagnoses   Final diagnoses:  Acute right-sided low back pain without sciatica     Discharge Instructions      Heat.  Medication as directed.  If persists, see Ortho.  Take care  Dr. Lacinda Axon    ED Prescriptions    Medication Sig Dispense Auth. Provider   predniSONE (DELTASONE) 10 MG tablet 50 mg daily x 2 days, then 40 mg daily x 2 days, then 30 mg daily x 2 days, then 20 mg daily x 2 days, then 10 mg daily x 2 days. 30 tablet Chrisanne Loose G, DO   traMADol (ULTRAM) 50 MG tablet Take 1 tablet (50 mg total) by mouth every 12 (twelve) hours as needed for moderate pain or severe pain. 10 tablet Thersa Salt G, DO     I have reviewed the PDMP during this encounter.   Coral Spikes, [place] 08/29/19 1349

## 2019-08-29 NOTE — Discharge Instructions (Addendum)
Heat.  Medication as directed.  If persists, see Ortho.  Take care  Dr. Lacinda Axon

## 2019-09-18 ENCOUNTER — Other Ambulatory Visit: Payer: Self-pay | Admitting: Physician Assistant

## 2019-09-18 DIAGNOSIS — M25551 Pain in right hip: Secondary | ICD-10-CM

## 2019-10-03 ENCOUNTER — Ambulatory Visit: Payer: Medicare Other

## 2019-10-05 ENCOUNTER — Ambulatory Visit
Admission: RE | Admit: 2019-10-05 | Discharge: 2019-10-05 | Disposition: A | Discharge status: Home / Self Care | Payer: Medicare Other | Source: Ambulatory Visit | Attending: Physician Assistant | Admitting: Physician Assistant

## 2019-10-05 ENCOUNTER — Other Ambulatory Visit: Payer: Self-pay

## 2019-10-05 DIAGNOSIS — M25551 Pain in right hip: Secondary | ICD-10-CM | POA: Diagnosis not present

## 2020-01-05 ENCOUNTER — Ambulatory Visit
Admission: EM | Admit: 2020-01-05 | Discharge: 2020-01-05 | Disposition: A | Discharge status: Home / Self Care | Payer: Medicare Other | Attending: Family Medicine | Admitting: Family Medicine

## 2020-01-05 ENCOUNTER — Other Ambulatory Visit: Payer: Self-pay

## 2020-01-05 ENCOUNTER — Encounter: Payer: Self-pay | Admitting: Emergency Medicine

## 2020-01-05 DIAGNOSIS — N3001 Acute cystitis with hematuria: Secondary | ICD-10-CM | POA: Diagnosis present

## 2020-01-05 LAB — URINALYSIS, COMPLETE (UACMP) WITH MICROSCOPIC
Bilirubin Urine: NEGATIVE
Glucose, UA: NEGATIVE mg/dL
Ketones, ur: NEGATIVE mg/dL
Nitrite: NEGATIVE
Protein, ur: 100 mg/dL — AB
Specific Gravity, Urine: 1.02 (ref 1.005–1.030)
pH: 7 (ref 5.0–8.0)

## 2020-01-05 MED ORDER — CEFDINIR 300 MG PO CAPS
300.0000 mg | ORAL_CAPSULE | Freq: Two times a day (BID) | ORAL | 0 refills | Status: DC
Start: 2020-01-05 — End: 2020-04-18

## 2020-01-05 NOTE — ED Provider Notes (Signed)
[facility]    CSN: [id number] Arrival date & time: 01/05/20  0805      History   Chief Complaint Chief Complaint  Patient presents with  . Dysuria   HPI  79 year old female presents with the above complaint.  Patient reports that her symptoms started in the middle of the night last night.  She reports dysuria and urinary frequency and urgency.  She reports some chills.  No abdominal pain.  No fever.  No back pain or flank pain.  No relieving factors.  Has a history of UTI earlier in the year.  She was treated with cephalosporins and had no adverse effects.  No other reported symptoms.  No other complaints.  Past Medical History:  Diagnosis Date  . Asthma   . COPD (chronic obstructive pulmonary disease) ([place])   . Cough variant asthma   . Diabetes ([place])    states was told she was not diabetic tested high once on hemoglobin A1C  . Dyslipidemia   . Emphysema of lung ([place])   . endometrial cancer 08/2016   Total Hysterectomy 09/04/2016  . HTN (hypertension)   . Hypertension   . Hypothyroidism   . Thyroid disease     Patient Active Problem List   Diagnosis Date Noted  . Carotid stenosis 09/15/2018  . CAD (coronary artery disease) 09/15/2018  . Hyperlipidemia 09/15/2018  . Essential hypertension 09/15/2018  . Chest pain 08/25/2018  . Endometrial cancer, FIGO stage IIIA ([place]) 09/16/2016  . Sinusitis, acute 08/13/2014  . Emphysema lung ([place]) 05/08/2014  . Asthma, chronic 03/06/2014  . Cough 03/06/2014    Past Surgical History:  Procedure Laterality Date  . ABDOMINAL HYSTERECTOMY    . CATARACT EXTRACTION, BILATERAL    . CYSTOSCOPY N/A 09/04/2016   Procedure: CYSTOSCOPY;  Surgeon: Ward, Honor Loh, MD;  Location: [facility] ORS;  Service: Gynecology;  Laterality: N/A;  . DILATATION & CURETTAGE/HYSTEROSCOPY WITH MYOSURE N/A 09/04/2016   Procedure: DILATATION & CURETTAGE/HYSTEROSCOPY WITH MYOSURE;  Surgeon: Ward, Honor Loh, MD;  Location: [facility] ORS;  Service:  Gynecology;  Laterality: N/A;  . GANGLION CYST EXCISION Left    hand  . LAPAROSCOPIC BILATERAL SALPINGO OOPHERECTOMY Bilateral 09/04/2016   Procedure: LAPAROSCOPIC BILATERAL SALPINGO OOPHORECTOMY;  Surgeon: Ward, Honor Loh, MD;  Location: [facility] ORS;  Service: Gynecology;  Laterality: Bilateral;  . LAPAROSCOPIC HYSTERECTOMY  09/04/2016   Procedure: HYSTERECTOMY TOTAL LAPAROSCOPIC;  Surgeon: Ward, Honor Loh, MD;  Location: [facility] ORS;  Service: Gynecology;;  . primary open reduction procedure Right 11/09/2013    right ankle  . TONSILLECTOMY    . TUBAL LIGATION      OB History   No obstetric history on file.      Home Medications    Prior to Admission medications   Medication Sig Start Date End Date Taking? Authorizing Provider  albuterol (PROVENTIL) (2.5 MG/3ML) 0.083% nebulizer solution Take 3 mLs (2.5 mg total) by nebulization every 6 (six) hours as needed for wheezing or shortness of breath. 01/25/18  Yes Wilhelmina Mcardle, MD  atorvastatin (LIPITOR) 40 MG tablet Take 1 tablet (40 mg total) by mouth daily at 6 PM. 08/26/18  Yes Mody, Sital, MD  Fluticasone-Salmeterol (WIXELA INHUB) 250-50 MCG/DOSE AEPB INHALE 1 DOSE BY MOUTH TWICE DAILY 06/01/19  Yes Tyler Pita, MD  levothyroxine (SYNTHROID, LEVOTHROID) 75 MCG tablet Take 75 mcg by mouth daily before breakfast.   Yes [provider]  losartan (COZAAR) 50 MG tablet Take 50 mg by mouth daily.   Yes [provider]  megestrol (MEGACE) 40 MG tablet Take 1 tablet (40 mg total) by mouth 2 (two) times daily. 12/16/16  Yes Berchuck, Mitzi Hansen, MD  NUCALA 100 MG/ML SOAJ Inject 100 mg into the skin every 28 (twenty-eight) days. 06/01/19  Yes Tyler Pita, MD  omeprazole (PRILOSEC OTC) 20 MG tablet Take 2 tablets (40 mg total) by mouth at bedtime. 11/01/17  Yes Wilhelmina Mcardle, MD  Albuterol Sulfate (PROAIR RESPICLICK) 517 (90 Base) MCG/ACT AEPB Inhale 2 puffs into the lungs every 6 (six) hours as needed. 11/23/17   Wilhelmina Mcardle, MD  cefdinir (OMNICEF) 300 MG capsule Take 1 capsule (300 mg total) by mouth 2 (two) times daily. 01/05/20   Coral Spikes, DO  ibuprofen (ADVIL,MOTRIN) 200 MG tablet Take 400 mg by mouth as needed for headache or moderate pain.     [provider]  nitroGLYCERIN (NITROSTAT) 0.4 MG SL tablet Place 1 tablet (0.4 mg total) under the tongue every 5 (five) minutes as needed for chest pain. 08/26/18   Bettey Costa, MD  Probiotic CAPS Take 1 capsule by mouth daily.    [provider]  traMADol (ULTRAM) 50 MG tablet Take 1 tablet (50 mg total) by mouth every 12 (twelve) hours as needed for moderate pain or severe pain. 08/29/19   Coral Spikes, DO    Family History Family History  Problem Relation Age of Onset  . Myasthenia gravis Daughter   . Emphysema Mother   . COPD Mother   . Dementia Mother   . Stroke Father   . Heart attack Sister     Social History Social History   Tobacco Use  . Smoking status: Never Smoker  . Smokeless tobacco: Never Used  Vaping Use  . Vaping Use: Never used  Substance Use Topics  . Alcohol use: No    Alcohol/week: 0.0 standard drinks  . Drug use: No     Allergies   Oxycodone, Sulfa antibiotics, Amoxicillin, and Levaquin [levofloxacin in d5w]   Review of Systems Review of Systems  Constitutional: Positive for chills. Negative for fever.  Genitourinary: Positive for dysuria, frequency and urgency.   Physical Exam Triage Vital Signs ED Triage Vitals  Enc Vitals Group     BP 01/05/20 0838 (!) 172/62     Pulse Rate 01/05/20 0838 60     Resp 01/05/20 0838 14     Temp 01/05/20 0838 98.2 F (36.8 C)     Temp Source 01/05/20 0838 Oral     SpO2 01/05/20 0838 100 %     Weight 01/05/20 0835 154 lb 15.7 oz (70.3 kg)     Height 01/05/20 0835 5' (1.524 m)     Head Circumference --      Peak Flow --      Pain Score 01/05/20 0834 0     Pain Loc --      Pain Edu? --      Excl. in [place]? --    Updated Vital Signs BP (!) 172/62 (BP  Location: Left Arm)   Pulse 60   Temp 98.2 F (36.8 C) (Oral)   Resp 14   Ht 5' (1.524 m)   Wt 70.3 kg   SpO2 100%   BMI 30.27 kg/m   Visual Acuity Right Eye Distance:   Left Eye Distance:   Bilateral Distance:    Right Eye Near:   Left Eye Near:    Bilateral Near:     Physical Exam Vitals and  nursing note reviewed.  Constitutional:      General: She is not in acute distress.    Appearance: Normal appearance. She is not ill-appearing.  HENT:     Head: Normocephalic and atraumatic.  Cardiovascular:     Rate and Rhythm: Normal rate and regular rhythm.  Pulmonary:     Effort: Pulmonary effort is normal.     Breath sounds: Normal breath sounds. No wheezing, rhonchi or rales.  Abdominal:     General: There is no distension.     Palpations: Abdomen is soft.     Tenderness: There is no abdominal tenderness.  Neurological:     Mental Status: She is alert.  Psychiatric:        Mood and Affect: Mood normal.        Behavior: Behavior normal.     UC Treatments / Results  Labs (all labs ordered are listed, but only abnormal results are displayed) Labs Reviewed  URINALYSIS, COMPLETE (UACMP) WITH MICROSCOPIC - Abnormal; Notable for the following components:      Result Value   APPearance CLOUDY (*)    Hgb urine dipstick MODERATE (*)    Protein, ur 100 (*)    Leukocytes,Ua SMALL (*)    Bacteria, UA FEW (*)    All other components within normal limits  URINE CULTURE    EKG   Radiology No results found.  Procedures Procedures (including critical care time)  Medications Ordered in UC Medications - No data to display  Initial Impression / Assessment and Plan / UC Course  I have reviewed the triage vital signs and the nursing notes.  Pertinent labs & imaging results that were available during my care of the patient were reviewed by me and considered in my medical decision making (see chart for details).    79 year old female presents with UTI.  Sending culture.   Patient has tolerated cephalosporins without difficulty.  Placing on Omnicef.   Final Clinical Impressions(s) / UC Diagnoses   Final diagnoses:  Acute cystitis with hematuria     Discharge Instructions     Antibiotic twice daily as directed.  Take care  Dr. Lacinda Axon    ED Prescriptions    Medication Sig Dispense Auth. Provider   cefdinir (OMNICEF) 300 MG capsule Take 1 capsule (300 mg total) by mouth 2 (two) times daily. 14 capsule Thersa Salt G, DO     PDMP not reviewed this encounter.   Coral Spikes, [place] 01/05/20 [phone]

## 2020-01-05 NOTE — ED Triage Notes (Signed)
Patient c/o burning when urinating and urinary frequency that started early this morning.

## 2020-01-05 NOTE — Discharge Instructions (Signed)
Antibiotic twice daily as directed.  Take care  Dr. Lacinda Axon

## 2020-01-06 LAB — URINE CULTURE: Culture: NO GROWTH

## 2020-01-17 ENCOUNTER — Other Ambulatory Visit: Payer: Self-pay | Admitting: Pulmonary Disease

## 2020-01-17 NOTE — Telephone Encounter (Signed)
Spoke to patient, who stated that she is due for re qualification on nucala patient assistance.  Anita Kent, please advise. Thanks   Recall has been placed for Jan 2022, as patient is past due for an appointment. No availability currently.

## 2020-01-19 MED ORDER — NUCALA 100 MG/ML ~~LOC~~ SOAJ: 100.0000 mg | SUBCUTANEOUS | 11 refills | Status: DC

## 2020-01-19 NOTE — Telephone Encounter (Signed)
ATC Patient.  LM for Patient to call back about Nucala 2022 re enrollment.  Patient needs OV with provider. New Nucala prescription sent to pharmacy.

## 2020-01-22 ENCOUNTER — Telehealth: Payer: Self-pay | Admitting: Pulmonary Disease

## 2020-01-22 NOTE — Telephone Encounter (Signed)
Returned patient's call, she needs a renewal application. Will mail to patient and she will drop off at [facility] office for MD signature and submission.

## 2020-02-01 NOTE — Telephone Encounter (Signed)
Submitted Patient Assistance Application to [employer] to [place]ucala for NUCALA along with provider portion. Will update patient when we receive a response.  Fax# [phone] Phone# [phone]  

## 2020-02-01 NOTE — Telephone Encounter (Signed)
Patient brought by completed enrollment form.  Signed forms have been faxed to [facility],

## 2020-02-14 NOTE — Telephone Encounter (Signed)
Application/investigation process will not start until after Jan 3rd, for upcoming 2022 year per [employer]. Will follow-up after Jan 3rd.

## 2020-02-19 NOTE — Telephone Encounter (Signed)
Still in investigation process. Awaiting approval

## 2020-02-20 NOTE — Telephone Encounter (Signed)
Pt has OV scheduled with Dr. Jayme Cloud on 1.24.22. Please advise if message can be closed. Thanks.

## 2020-02-21 NOTE — Telephone Encounter (Signed)
Still awaiting approval process.

## 2020-02-26 ENCOUNTER — Other Ambulatory Visit: Payer: Self-pay

## 2020-02-26 MED ORDER — FLUTICASONE-SALMETEROL 250-50 MCG/DOSE IN AEPB: INHALATION_SPRAY | RESPIRATORY_TRACT | 1 refills | Status: DC

## 2020-02-26 NOTE — Telephone Encounter (Signed)
Received Rx request for Wixela 250 from [place].  Rx has been sent to preferred pharmacy.

## 2020-03-11 ENCOUNTER — Ambulatory Visit: Payer: Medicare Other | Admitting: Pulmonary Disease

## 2020-03-13 ENCOUNTER — Other Ambulatory Visit: Payer: Self-pay | Admitting: Pulmonary Disease

## 2020-04-11 ENCOUNTER — Ambulatory Visit: Payer: Medicare Other | Admitting: Pulmonary Disease

## 2020-04-18 ENCOUNTER — Ambulatory Visit (INDEPENDENT_AMBULATORY_CARE_PROVIDER_SITE_OTHER): Payer: Medicare Other | Admitting: Pulmonary Disease

## 2020-04-18 ENCOUNTER — Other Ambulatory Visit: Payer: Self-pay

## 2020-04-18 ENCOUNTER — Encounter: Payer: Self-pay | Admitting: Pulmonary Disease

## 2020-04-18 VITALS — BP 126/78 | HR 78 | Temp 97.7°F | Ht 60.0 in | Wt 153.6 lb

## 2020-04-18 DIAGNOSIS — R0982 Postnasal drip: Secondary | ICD-10-CM

## 2020-04-18 DIAGNOSIS — R0602 Shortness of breath: Secondary | ICD-10-CM

## 2020-04-18 DIAGNOSIS — J4550 Severe persistent asthma, uncomplicated: Secondary | ICD-10-CM | POA: Diagnosis not present

## 2020-04-18 MED ORDER — FLUTICASONE-SALMETEROL 250-50 MCG/DOSE IN AEPB: INHALATION_SPRAY | RESPIRATORY_TRACT | 6 refills | Status: DC

## 2020-04-18 MED ORDER — PROAIR RESPICLICK 108 (90 BASE) MCG/ACT IN AEPB: 2.0000 | INHALATION_SPRAY | Freq: Four times a day (QID) | RESPIRATORY_TRACT | 6 refills | Status: AC | PRN

## 2020-04-18 NOTE — Patient Instructions (Signed)
Continue your [place], [place] and as needed albuterol   We are going to get breathing tests to compare to the prior breathing test you had.  This will let us know if we need to make any adjustments to your inhalers  At nighttime if you notice congestion and back drip you can use Zyrtec 10 mg 1 tablet at bedtime.  You can get this over-the-counter.  You can get the generic form which is called cetirizine.   We will see you in follow-up in 6 months time call sooner should any new problems arise.

## 2020-04-18 NOTE — Progress Notes (Signed)
Subjective:    Patient ID: Anita Kent, female    DOB: February 28, 1940, 80 y.o.   MRN: [id number] PT PROFILE: 80 y.o. F never smoker with history of asthma, previously patient of Dr. Stevenson Clinch and Dr. Alva Garnet.    DATA: 05/08/14 PFTs: FVC: 2.07 > 2.12 L (88 > 90 %pred), FEV1: 1.43 > 1.60 L (82 > 92 %pred), FEV1/FVC: 69%, TLC: 4.34 L (97 %pred), DLCO 67 %pred, DLCO/VA 81% predicted 09/24/16 CT chest: Peripheral wedge-shaped area of subsegmental atelectasis noted within the anterior left upper lobe. Scar versus subsegmental atelectasis noted within the posteromedial right lung base and right middle lobe 11/18/17 PFTs: FVC: 1.56 > 1.61 L (69 > 71 %pred), FEV1: 1.03 > 1.00 L (59 > 57 %pred), FEV1/FVC: 66%, TLC: 4.90 L (109 %pred), DLCO measurement invalid 08/26/2018 2D echo: Mild DD, LVEF 50 to 55%.  No evidence of pulmonary hypertension.   INTERVAL:  Last visit here was 12/30/2018, that was her initial visit with me.  She has been on Nucala since 2019.  HPI Patient is an 80 year old lifelong never smoker with persistent asthma controlled with Nucala and Advair (Wixela) she also uses as needed albuterol.  This is a scheduled visit.  She is self injecting Nucala every 4 weeks.  Doing well with this.  She continues to have dyspnea on exertion worse over the last several months due to a hamstring injury which has reduced her activity significantly.  She is not having difficulties obtaining Wixela.  No cough.  Rare use of albuterol inhaler.  Does need refills on Wixela and albuterol.  She does not endorse any other complaint except for nasal congestion worse in the mornings with some postnasal drip.  No chest pain, no orthopnea or paroxysmal nocturnal dyspnea.  No lower extremity edema.  Otherwise feels well and looks well.   Review of Systems A 10 point review of systems was performed and it is as noted above otherwise negative.  Patient Active Problem List   Diagnosis Date Noted  . Carotid stenosis  09/15/2018  . CAD (coronary artery disease) 09/15/2018  . Hyperlipidemia 09/15/2018  . Essential hypertension 09/15/2018  . Chest pain 08/25/2018  . Endometrial cancer, FIGO stage IIIA ([place]) 09/16/2016  . Sinusitis, acute 08/13/2014  . Emphysema lung ([place]) 05/08/2014  . Asthma, chronic 03/06/2014  . Cough 03/06/2014   Allergies  Allergen Reactions  . Oxycodone Itching  . Sulfa Antibiotics Hives  . Amoxicillin Hives and Rash    Has patient had a PCN reaction causing immediate rash, facial/tongue/throat swelling, SOB or lightheadedness with hypotension: Yes Has patient had a PCN reaction causing severe rash involving mucus membranes or skin necrosis: Yes Has patient had a PCN reaction that required hospitalization: No Has patient had a PCN reaction occurring within the last 10 years: Yes If all of the above answers are "NO", then may proceed with Cephalosporin use.   [place] [Levofloxacin In D5w] Hives   Current Meds  Medication Sig  . albuterol (PROVENTIL) (2.5 MG/3ML) 0.083% nebulizer solution Take 3 mLs (2.5 mg total) by nebulization every 6 (six) hours as needed for wheezing or shortness of breath.  . Albuterol Sulfate (PROAIR RESPICLICK) 025 (90 Base) MCG/ACT AEPB Inhale 2 puffs into the lungs every 6 (six) hours as needed.  Marland Kitchen atorvastatin (LIPITOR) 40 MG tablet Take 1 tablet (40 mg total) by mouth daily at 6 PM.  . Fluticasone-Salmeterol (WIXELA INHUB) 250-50 MCG/DOSE AEPB INHALE 1 DOSE BY MOUTH TWICE DAILY  . ibuprofen (ADVIL,MOTRIN)  200 MG tablet Take 400 mg by mouth as needed for headache or moderate pain.   Marland Kitchen levothyroxine (SYNTHROID, LEVOTHROID) 75 MCG tablet Take 75 mcg by mouth daily before breakfast.  . losartan (COZAAR) 50 MG tablet Take 50 mg by mouth daily.  . nitroGLYCERIN (NITROSTAT) 0.4 MG SL tablet Place 1 tablet (0.4 mg total) under the tongue every 5 (five) minutes as needed for chest pain.  Marland Kitchen NUCALA 100 MG/ML SOAJ INJECT 100MG  SUBCUTANEOUSLY EVERY 4 WEEKS  (GIVEN AT MD  OFFICE)  . omeprazole (PRILOSEC OTC) 20 MG tablet Take 2 tablets (40 mg total) by mouth at bedtime.  . Probiotic CAPS Take 1 capsule by mouth daily.  . [DISCONTINUED] cefdinir (OMNICEF) 300 MG capsule Take 1 capsule (300 mg total) by mouth 2 (two) times daily.  . [DISCONTINUED] traMADol (ULTRAM) 50 MG tablet Take 1 tablet (50 mg total) by mouth every 12 (twelve) hours as needed for moderate pain or severe pain.   Immunization History  Administered Date(s) Administered  . Influenza Split 01/04/2014  . Influenza-Unspecified 12/25/2013  . Pneumococcal Polysaccharide-23 01/04/2014  . Pneumococcal-Unspecified 12/25/2013  . Tdap 06/15/2017       Objective:   Physical Exam BP 126/78 (BP Location: Left Arm, Cuff Size: Normal)   Pulse 78   Temp 97.7 F (36.5 C) (Temporal)   Ht 5' (1.524 m)   Wt 153 lb 9.6 oz (69.7 kg)   SpO2 98%   BMI 30.00 kg/m  GENERAL: Well-developed, overweight elderly woman, quite spry.  Age-appropriate.  No acute distress.  Ambulatory with assistance of a cane. HEAD: Normocephalic, atraumatic.  EYES: Pupils equal, round, reactive to light.  No scleral icterus.  MOUTH: Nose/mouth/throat not examined due to masking requirements for COVID 19. NECK: Supple. No thyromegaly. Trachea midline. No JVD.  No adenopathy. PULMONARY: Good air entry bilaterally.  Mild wheezing in the upper lung zones, no other adventitious sounds. CARDIOVASCULAR: S1 and S2. Regular rate and rhythm.  No rubs, murmurs or gallops heard. ABDOMEN: Benign. 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  NEUROLOGIC: No overt focal deficit, gait assisted with cane, fluent speech. SKIN: Intact,warm,dry.  On limited exam, no rashes. PSYCH: Mood and behavior is normal.     Assessment & Plan:     ICD-10-CM   1. Severe persistent asthma  J45.50 Pulmonary Function Test [facility] Only   Continue Nucala, Wixela and as needed albuterol PFTs to assess stability of disease She did  have some bronchospasm today May need to adjust inhaler  2. Shortness of breath  R06.02    PFTs as above Suspect recent worsening due to deconditioning  3. 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 Pulmonary Function Test [facility] Only    Standing Status:   Future    Standing Expiration Date:   04/18/2021    Scheduling Instructions:     4 weeks    Order Specific Question:   Full PFT: includes the following: basic spirometry, spirometry pre & post bronchodilator, diffusion capacity (DLCO), lung volumes    Answer:   Full PFT   Meds ordered this encounter  Medications  . Fluticasone-Salmeterol (WIXELA INHUB) 250-50 MCG/DOSE AEPB    Sig: INHALE 1 DOSE BY MOUTH TWICE DAILY    Dispense:  60 each    Refill:  6  . Albuterol Sulfate (PROAIR RESPICLICK) 623 (90 Base) MCG/ACT AEPB  Sig: Inhale 2 puffs into the lungs every 6 (six) hours as needed.    Dispense:  1 each    Refill:  6   Discussion: Overall the patient is doing well.  She was noted to have some mild wheezing today.  Will obtain PFTs to assess stability of disease.  She may be having very mild flare due to seasonal change she does have tendency to seasonal change with her asthmatic exacerbations.  She is having some flare of her perennial rhinitis with seasonal variation and we will give her a trial of as needed Zyrtec at nighttime.  We will see her in follow-up in 6 months time she is to contact us prior to that time should any new difficulties arise.  Otherwise as above.   Renold Don, MD [facility]   *This note was dictated using voice recognition software/Dragon.  Despite best efforts to proofread, errors can occur which can change the meaning.  Any change was purely unintentional.

## 2020-04-24 ENCOUNTER — Other Ambulatory Visit: Payer: Medicare Other

## 2020-04-25 ENCOUNTER — Ambulatory Visit: Payer: Medicare Other

## 2020-05-13 ENCOUNTER — Telehealth: Payer: Self-pay | Admitting: Pulmonary Disease

## 2020-05-13 NOTE — Telephone Encounter (Signed)
Waiting for Anita Kent to call me back about changing Covid Test appt

## 2020-05-13 NOTE — Telephone Encounter (Signed)
I have called and left another message for Anita Kent to call me back about later Covid Test appt time

## 2020-05-14 ENCOUNTER — Other Ambulatory Visit: Payer: Self-pay

## 2020-05-14 ENCOUNTER — Other Ambulatory Visit
Admission: RE | Admit: 2020-05-14 | Discharge: 2020-05-14 | Disposition: A | Discharge status: Home / Self Care | Payer: Medicare Other | Source: Ambulatory Visit | Attending: Pulmonary Disease | Admitting: Pulmonary Disease

## 2020-05-14 DIAGNOSIS — Z20822 Contact with and (suspected) exposure to covid-19: Secondary | ICD-10-CM | POA: Diagnosis not present

## 2020-05-14 DIAGNOSIS — Z01812 Encounter for preprocedural laboratory examination: Secondary | ICD-10-CM | POA: Diagnosis present

## 2020-05-14 LAB — SARS CORONAVIRUS 2 (TAT 6-24 HRS): SARS Coronavirus 2: NEGATIVE

## 2020-05-14 NOTE — Telephone Encounter (Signed)
They never called me back to give the patient another option. I just looked and she has arrive for her Covid Test at 8:49am this morning

## 2020-05-15 ENCOUNTER — Ambulatory Visit: Payer: Medicare Other | Attending: Pulmonary Disease

## 2020-05-15 ENCOUNTER — Other Ambulatory Visit: Payer: Self-pay

## 2020-05-15 DIAGNOSIS — J4550 Severe persistent asthma, uncomplicated: Secondary | ICD-10-CM | POA: Insufficient documentation

## 2020-10-15 ENCOUNTER — Ambulatory Visit (INDEPENDENT_AMBULATORY_CARE_PROVIDER_SITE_OTHER): Payer: Medicare Other | Admitting: Pulmonary Disease

## 2020-10-15 ENCOUNTER — Other Ambulatory Visit: Payer: Self-pay

## 2020-10-15 ENCOUNTER — Encounter: Payer: Self-pay | Admitting: Pulmonary Disease

## 2020-10-15 VITALS — BP 128/62 | HR 69 | Temp 98.1°F | Ht 60.0 in | Wt 159.6 lb

## 2020-10-15 DIAGNOSIS — J4550 Severe persistent asthma, uncomplicated: Secondary | ICD-10-CM

## 2020-10-15 MED ORDER — FLUTICASONE-SALMETEROL 250-50 MCG/ACT IN AEPB: 1.0000 | INHALATION_SPRAY | Freq: Two times a day (BID) | RESPIRATORY_TRACT | 11 refills | Status: DC

## 2020-10-15 NOTE — Progress Notes (Signed)
 Subjective:    Patient ID: Anita Kent, female    DOB: [birth year], 80 y.o.   MRN: [id number] Chief Complaint  Patient presents with   Follow-up    Severe asthma   PT PROFILE: 80 y.o. F never smoker with history of asthma, previously patient of Dr. Dema Severin and Dr. Sung Amabile.     DATA: 05/08/14 PFTs: FVC: 2.07 > 2.12 L (88 > 90 %pred), FEV1: 1.43 > 1.60 L (82 > 92 %pred), FEV1/FVC: 69%, TLC: 4.34 L (97 %pred), DLCO 67 %pred, DLCO/VA 81% predicted 09/24/16 CT chest: Peripheral wedge-shaped area of subsegmental atelectasis noted within the anterior left upper lobe. Scar versus subsegmental atelectasis noted within the posteromedial right lung base and right middle lobe 11/18/17 PFTs: FVC: 1.56 > 1.61 L (69 > 71 %pred), FEV1: 1.03 > 1.00 L (59 > 57 %pred), FEV1/FVC: 66%, TLC: 4.90 L (109 %pred), DLCO measurement invalid 08/26/2018 2D echo: Mild DD, LVEF 50 to 55%.  No evidence of pulmonary hypertension.    INTERVAL:  Last visit here was 04/18/2020.  She was doing well with her self-administered Nucala.  She has been on Nucala since 2019.  HPI Patient is an 80 year old lifelong never smoker with persistent asthma controlled with Nucala and Advair (Wixela) she also uses as needed albuterol.  This is a scheduled visit.  She is self injecting Nucala every 4 weeks.  Doing well with this.  Previously she had increased dyspnea on exertion due to a hamstring injury which had reduced her activity significantly.  This has now resolved.  She is not having difficulties obtaining Wixela.  No cough.  Rare use of albuterol inhaler.  Does need refills on Wixela and albuterol.  She does not endorse any other complaint except for nasal congestion worse in the mornings with some postnasal drip.  No chest pain, no orthopnea or paroxysmal nocturnal dyspnea.  No lower extremity edema.  Overall she feels well and looks well.     Review of Systems     Objective:   Physical Exam BP 128/62 (BP Location: Left Arm,  Patient Position: Sitting, Cuff Size: Normal)   Pulse 69   Temp 98.1 F (36.7 C) (Oral)   Ht 5' (1.524 m)   Wt 159 lb 9.6 oz (72.4 kg)   SpO2 98%   BMI 31.17 kg/m   SpO2: 98 % O2 Device: None (Room air)  GENERAL: Well-developed, overweight 80 y/o elderly woman, quite spry.  Age-appropriate.  No acute distress.  Ambulatory with assistance of a cane. HEAD: Normocephalic, atraumatic.  EYES: Pupils equal, round, reactive to light.  No scleral icterus.  MOUTH: Nose/mouth/throat not examined due to masking requirements for COVID 19. NECK: Supple. No thyromegaly. Trachea midline. No JVD.  No adenopathy. PULMONARY: Good air entry bilaterally.  Mild wheezing in the upper lung zones, no other adventitious sounds. CARDIOVASCULAR: S1 and S2. Regular rate and rhythm.  No rubs, murmurs or gallops heard. ABDOMEN: Benign. 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  NEUROLOGIC: No overt focal deficit, gait assisted with cane, fluent speech. SKIN: Intact,warm,dry.  On limited exam, no rashes. PSYCH: Mood and behavior is normal.      Assessment & Plan:     ICD-10-CM   1. Severe persistent asthma  J45.50    Well compensated on Nucala Continue Wixela and as needed albuterol     Patient is doing well.  Continue same regimen.  Follow-up in 6 months time she is to call sooner should any new problems arise.  Gailen Shelter, MD [facility]    *This note was generated using voice recognition software/Dragon and/or AI transcription program.  Despite best efforts to proofread, errors can occur which can change the meaning. Any transcriptional errors that result from this process are unintentional and may not be fully corrected at the time of dictation.

## 2020-10-15 NOTE — Patient Instructions (Signed)
Continue Wixela as you are doing.  Continue Nucala.   Follow-up in 6 months time call sooner should any new problems arise.

## 2020-11-07 ENCOUNTER — Other Ambulatory Visit: Payer: Self-pay | Admitting: Pulmonary Disease

## 2020-12-11 IMAGING — DX DG CHEST 1V PORT
1 series · 1 of 1 positions shown · non-contrast
Comparison: 08/25/2018

CLINICAL DATA: Shortness of breath, chills

EXAM:
PORTABLE CHEST 1 VIEW

[chest ap]
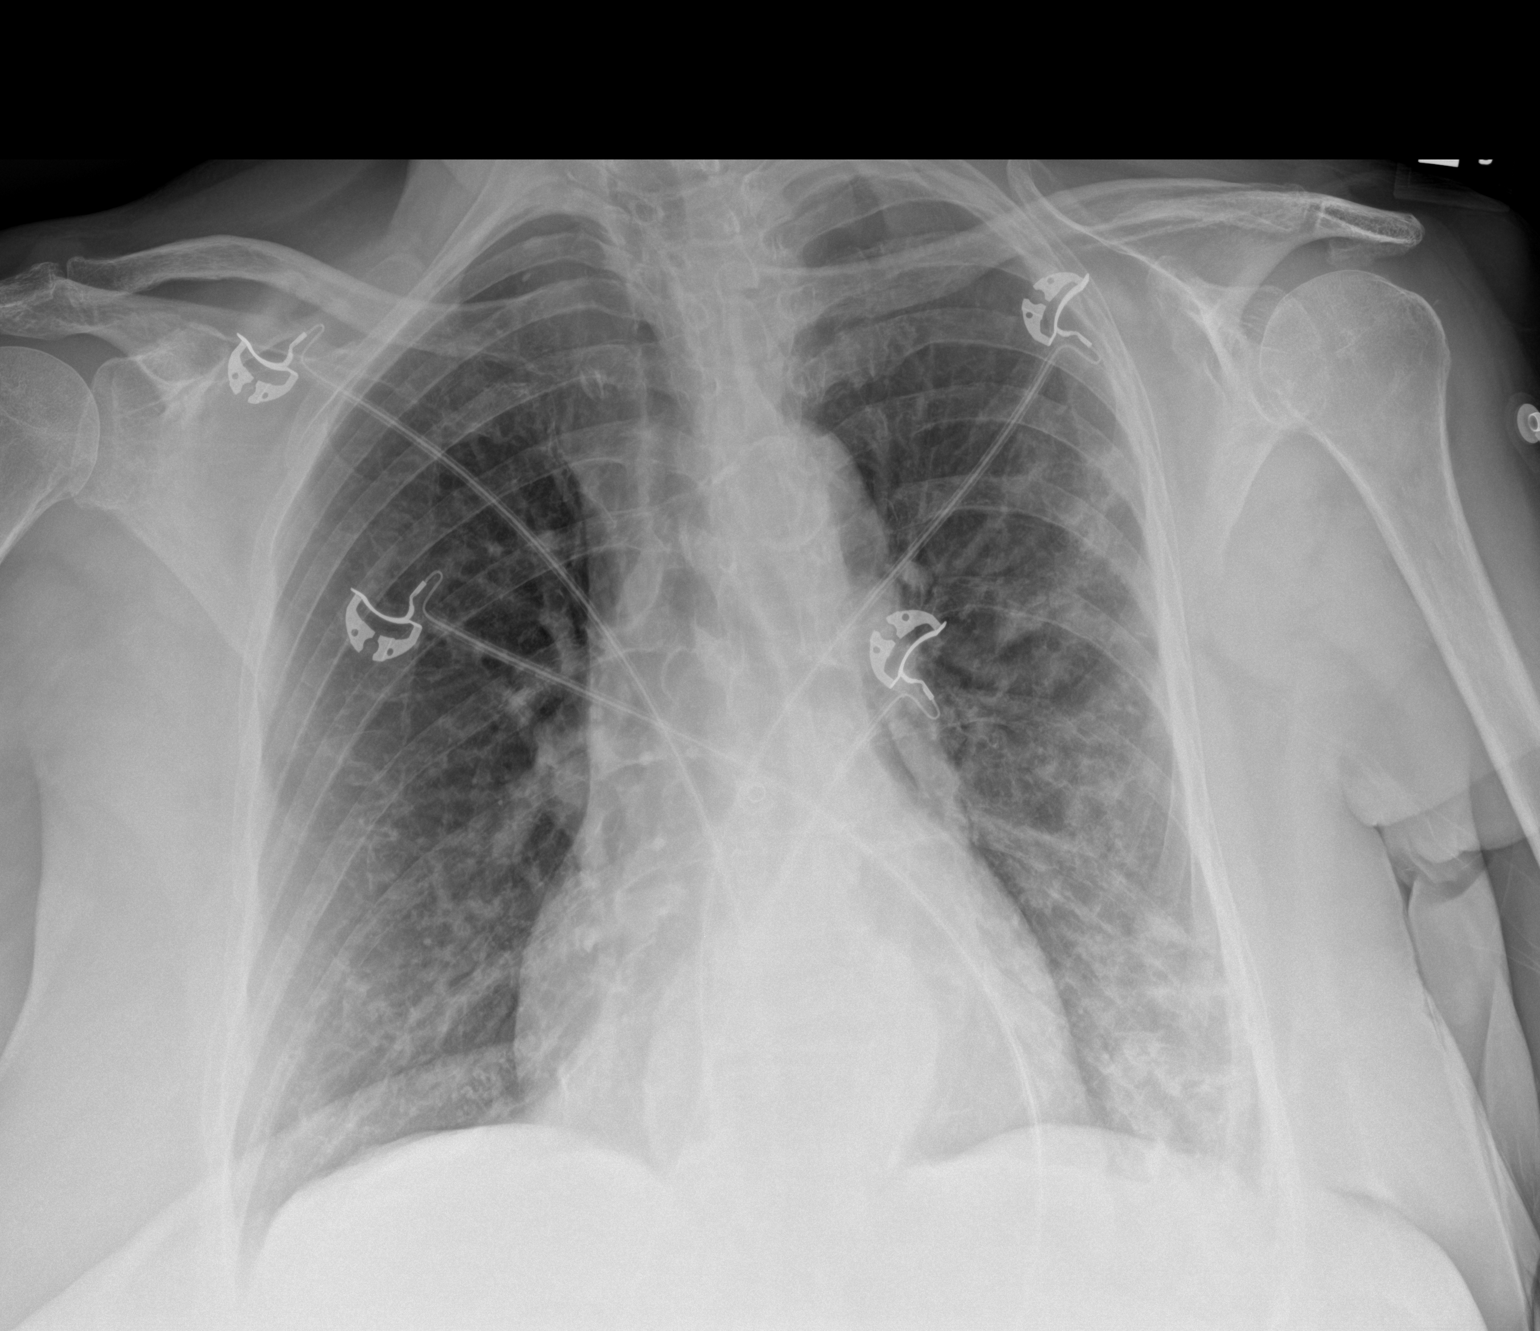

[1 of 1 positions shown; findings below may reference images not displayed]

FINDINGS: Stable heart size. Smoothly marginated retrocardiac opacity
compatible with a moderate-sized hiatal hernia. Atherosclerotic
calcification of the aortic knob. Prominent interstitial markings
with patchy airspace opacities predominantly involving the
peripheral aspects of the lung fields, left worse than right. No
pleural effusion or pneumothorax.
IMPRESSION: 1. Prominent interstitial markings with patchy airspace opacities
predominantly involving the peripheral aspects of the lung fields,
left worse than right. Findings suspicious for multifocal
atypical/viral pneumonia.
2. Moderate-sized hiatal hernia.

## 2020-12-17 ENCOUNTER — Telehealth: Payer: Self-pay | Admitting: Pharmacist

## 2020-12-17 DIAGNOSIS — J4550 Severe persistent asthma, uncomplicated: Secondary | ICD-10-CM

## 2020-12-17 NOTE — Telephone Encounter (Signed)
Received Nucala PAP re-enrollment application from [facility].   Will fax provider portion to [facility] office to be signed by Dr. Patsey Berthold. Will mail patient her portion to be completed and returned to pharmacy team   Last seen on 10/15/20    Knox Saliva, PharmD, MPH, BCPS Clinical Pharmacist (Rheumatology and Pulmonology)

## 2020-12-19 NOTE — Telephone Encounter (Signed)
Signed provider portion received for Nucala PAP renewal application. Pending patient portion that was mailed to patient on 12/17/20  Knox Saliva, PharmD, MPH, BCPS Clinical Pharmacist (Rheumatology and Pulmonology)

## 2021-01-01 NOTE — Telephone Encounter (Signed)
Patient brought by nucala application.  Application has been faxed to pharmacy team. .

## 2021-01-02 NOTE — Telephone Encounter (Signed)
Submitted Patient Assistance Application to [facility] for NUCALA along with provider portion, PA and income documents. Will update patient when we receive a response.  Fax# [phone] Phone# [phone] 

## 2021-01-24 NOTE — Telephone Encounter (Signed)
Called [facility] [place] regarding patient's Nucala PAP renewal application. Per rep, all renewal applications are being reviewed in 2023.  Per rep review, all forms needed for re-enrollment are on file.  Phone: [phone]  Knox Saliva, PharmD, MPH, BCPS Clinical Pharmacist (Rheumatology and Pulmonology)

## 2021-01-31 ENCOUNTER — Telehealth: Payer: Self-pay | Admitting: Pulmonary Disease

## 2021-01-31 NOTE — Telephone Encounter (Signed)
Returned call to patient and advised that [facility] is not processing renewals until 02/16/21. Advised that she can call the pharmacy to schedule one last shipment before the end of the year since she is due 02/13/21  Knox Saliva, PharmD, MPH, BCPS Clinical Pharmacist (Rheumatology and Pulmonology)

## 2021-02-26 NOTE — Telephone Encounter (Signed)
Per [facility] [place], patient's Nucala PAP renewal application has been received and is being reviewed. Application is complete, but may take additional business days to process   Knox Saliva, PharmD, MPH, BCPS Clinical Pharmacist (Rheumatology and Pulmonology)

## 2021-02-28 ENCOUNTER — Telehealth: Payer: Self-pay | Admitting: Pulmonary Disease

## 2021-02-28 NOTE — Telephone Encounter (Signed)
Pharmacy team, do we have samples of Nucala?

## 2021-02-28 NOTE — Telephone Encounter (Signed)
We do, however the majority of our Nucala pt's are also waiting for their renewals to be complete and may require samples. Do we know what day the pt will be due for her injection?

## 2021-02-28 NOTE — Telephone Encounter (Signed)
Spoke to patient.  Patient stated that she is past due for injection. She was due on 02/13/2021. She can not travel to [facility] for sample. She is questioning if this can be sent to [place] office so she can pickup.   Pharmacy team, please advise. Thanks

## 2021-03-03 MED ORDER — NUCALA 100 MG/ML ~~LOC~~ SOAJ: SUBCUTANEOUS | 2 refills | Status: DC

## 2021-03-03 NOTE — Telephone Encounter (Signed)
Received a fax from  [place] to [place] regarding an approval for [facility] patient assistance from 02/28/21 to 02/15/22.   Phone number: [phone]  Copy of application and approval letter sent to media tab.  Rx for Nucala sent to [facility] pharmacy.  Knox Saliva, PharmD, MPH, BCPS Clinical Pharmacist (Rheumatology and Pulmonology)

## 2021-03-03 NOTE — Telephone Encounter (Signed)
We are unable to courier medication samples to [place] office. Pharmacy team unable to expedite process for Nucala PAP. Per rep that I spoke with last week, we should start receiving renewal application responses this week  Knox Saliva, PharmD, MPH, BCPS Clinical Pharmacist (Rheumatology and Pulmonology)

## 2021-03-04 NOTE — Telephone Encounter (Signed)
noted 

## 2021-03-04 NOTE — Telephone Encounter (Signed)
Lm for patient.  

## 2021-03-04 NOTE — Telephone Encounter (Signed)
Patient is aware of below message and voiced her understanding. She stated that she is supposed to receive Nucala tomorrow.  Nothing further needed at this time.

## 2021-03-14 IMAGING — MR MR HIP*R* W/O CM
5 of 6 series · 32 of 40 positions shown · non-contrast
Comparison: PET-CT dated September 29, 2016.

CLINICAL DATA: Right hip pain radiating down the thigh for the past
2-3 months. No injury or prior surgery.

EXAM:
MR OF THE RIGHT HIP WITHOUT CONTRAST
TECHNIQUE: Multiplanar, multisequence MR imaging was performed. No intravenous
contrast was administered.

[Series 3: T2 fat-sat · coronal · right · 4.0mm · 1.19mm/px · 8 of 38 slices shown (1 of 3)]
[im 1/38]
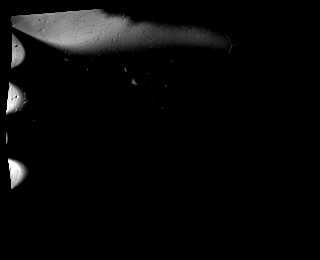
[im 6/38]
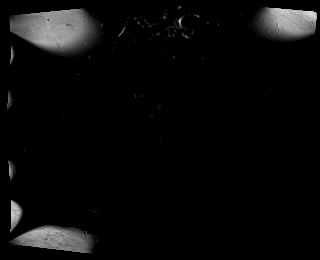
[im 11/38]
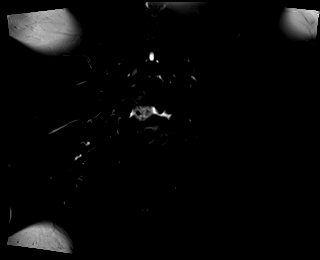
[im 16/38]
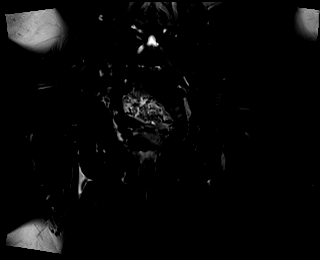
[im 22/38]
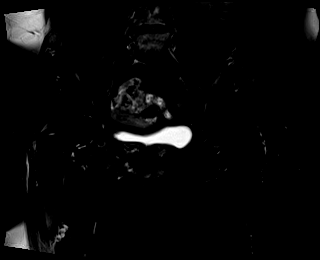
[im 27/38]
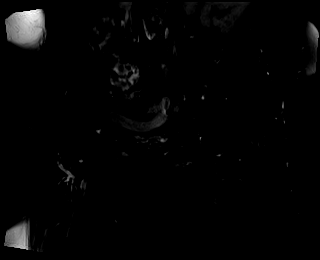
[im 32/38]
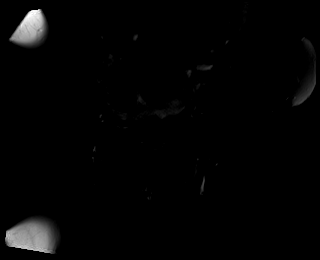
[im 38/38]
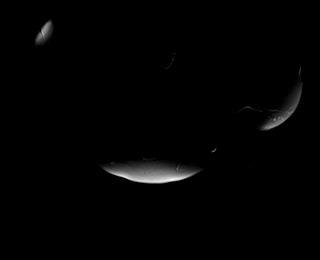

[Series 4: T2 fat-sat · axial · right · 4.0mm · 0.35mm/px · z∈[-68,+77]mm · 6 of 30 slices shown (2 of 3)]
[im 1/30]
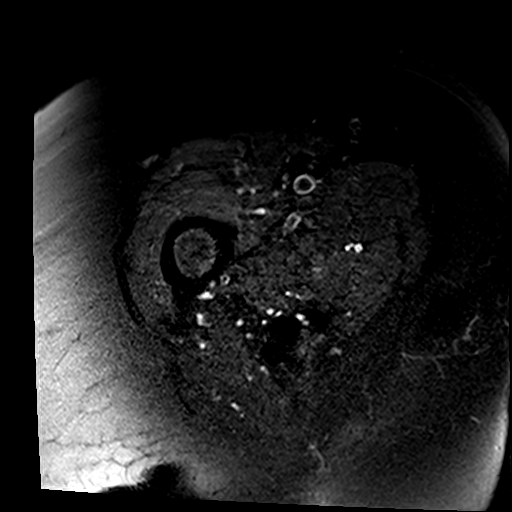
[im 6/30]
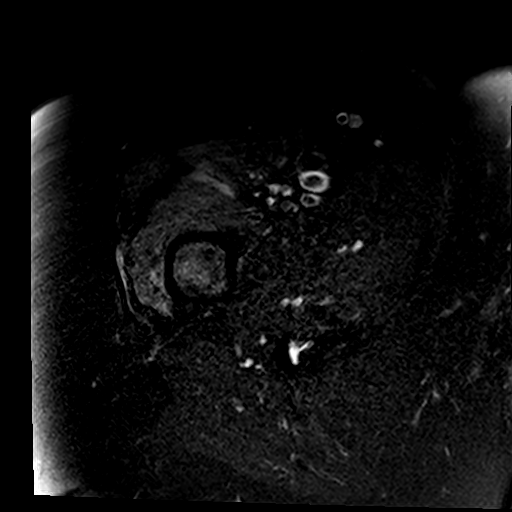
[im 12/30]
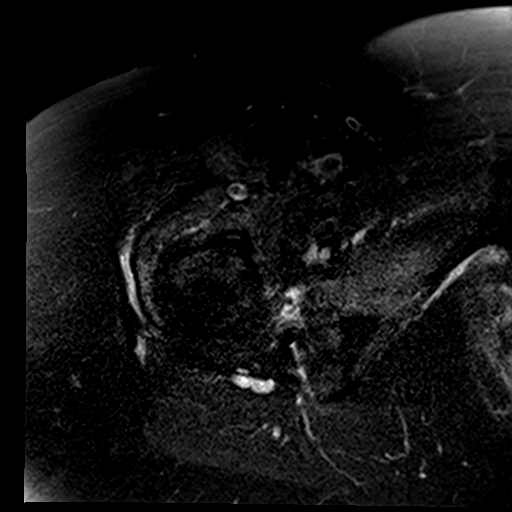
[im 18/30]
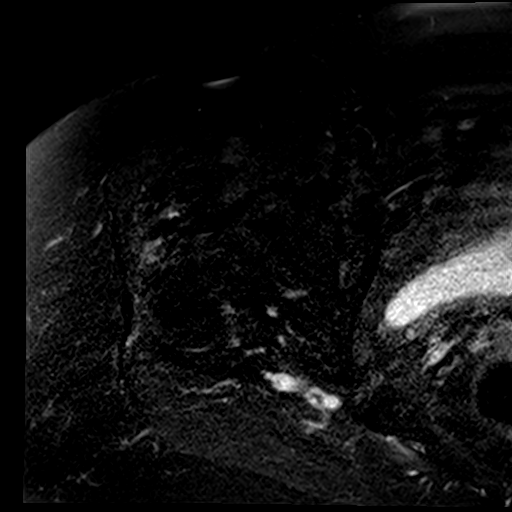
[im 24/30]
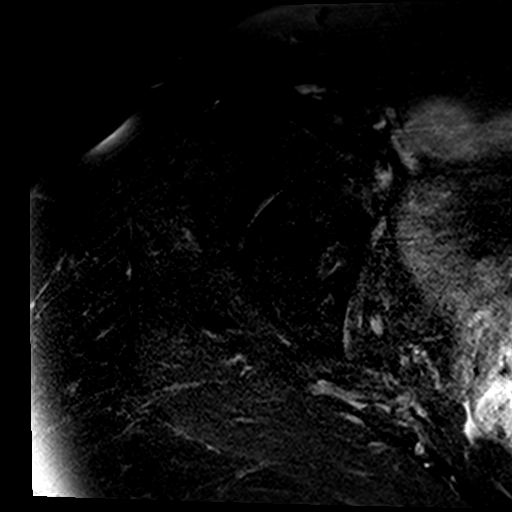
[im 30/30]
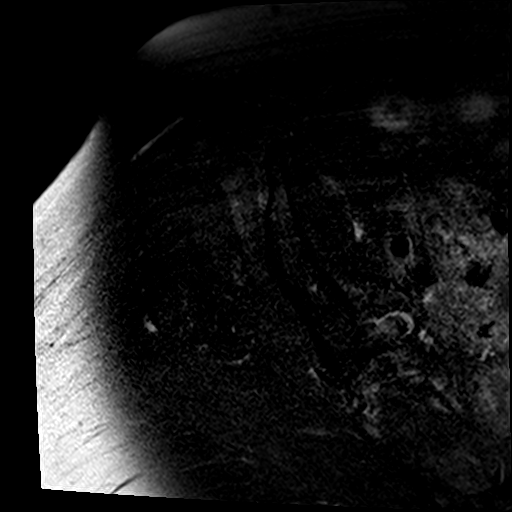

[Series 5: PD fat-sat · sagittal · right · 4.0mm · 0.70mm/px · 6 of 29 slices shown (1 of 2)]
[im 1/29]
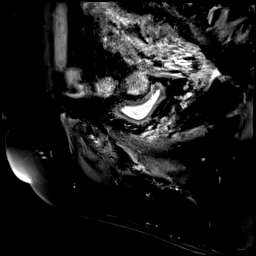
[im 6/29]
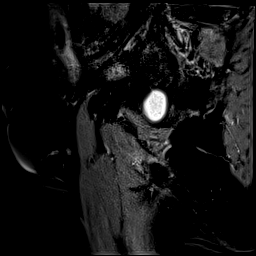
[im 12/29]
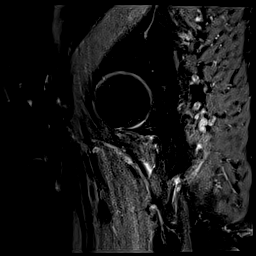
[im 17/29]
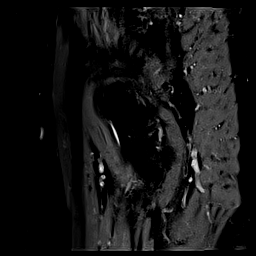
[im 23/29]
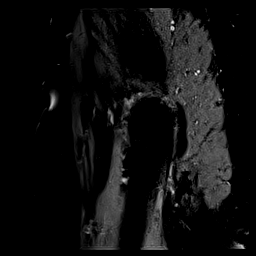
[im 29/29]
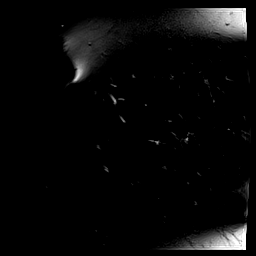

[Series 6: T2 fat-sat · axial · right · 4.0mm · 0.35mm/px · z∈[-68,+77]mm · 6 of 30 slices shown (3 of 3)]
[im 1/30]
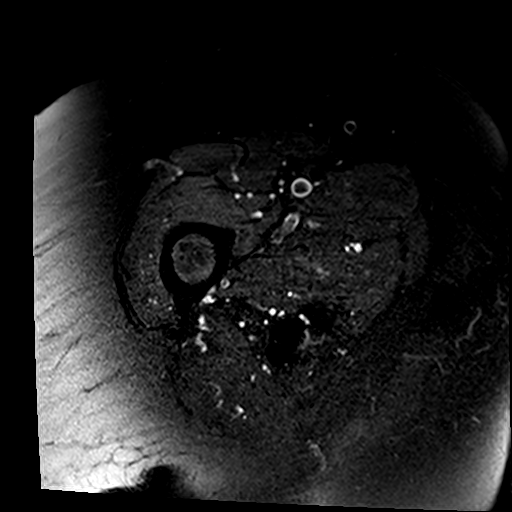
[im 6/30]
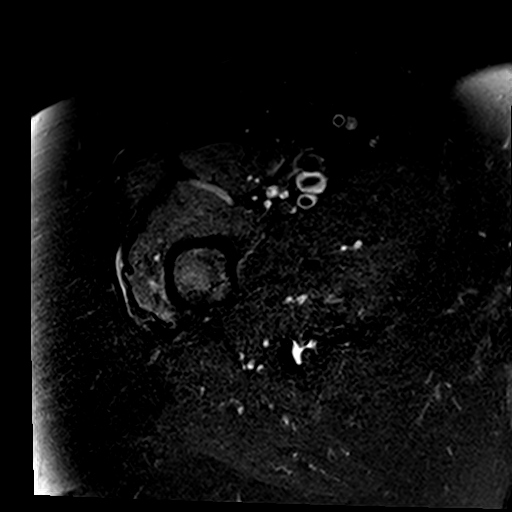
[im 12/30]
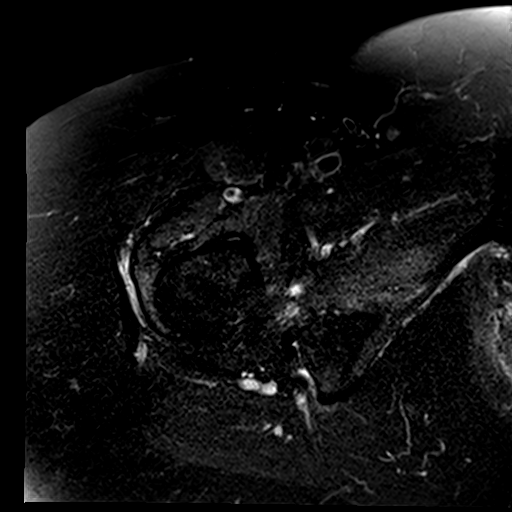
[im 18/30]
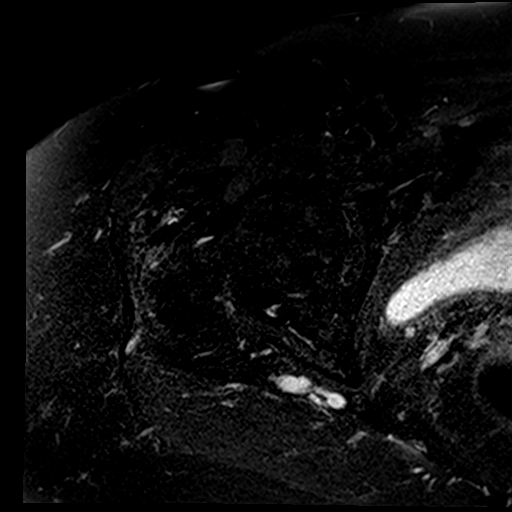
[im 24/30]
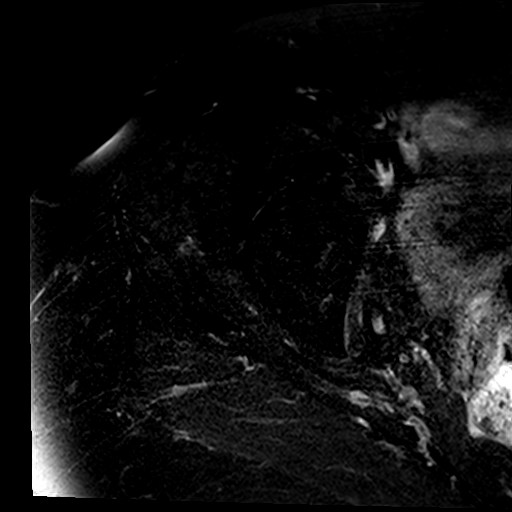
[im 30/30]
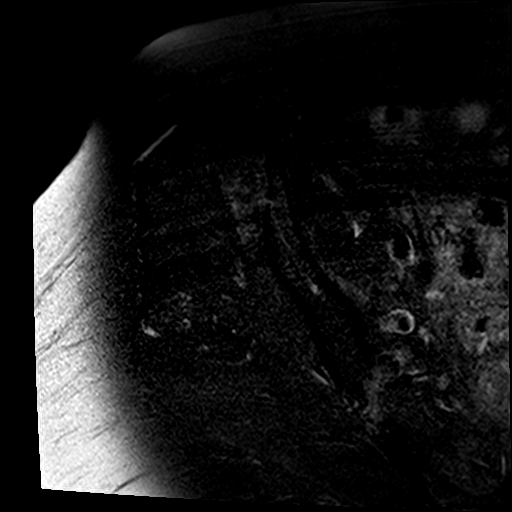

[Series 7: PD fat-sat · coronal · right · 4.0mm · 0.70mm/px · 6 of 29 slices shown (2 of 2)]
[im 1/29]
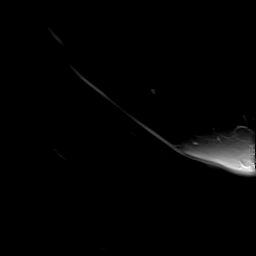
[im 6/29]
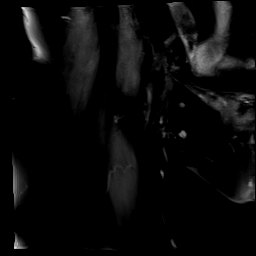
[im 12/29]
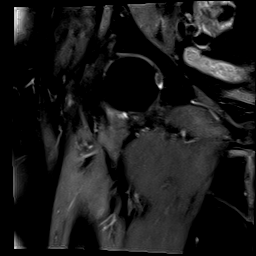
[im 17/29]
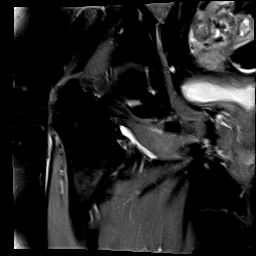
[im 23/29]
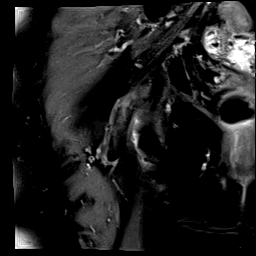
[im 29/29]
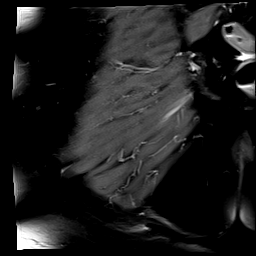

[32 of 40 positions shown; findings below may reference images not displayed]

FINDINGS: Bones: There is no evidence of acute fracture, dislocation or
avascular necrosis. No focal bone lesion. Degenerative changes of
the sacroiliac joints and pubic symphysis.

Articular cartilage and labrum

Articular cartilage: No focal chondral defect or subchondral signal
abnormality identified.

Labrum: Grossly intact, although evaluation is limited due to lack
of intra-articular fluid. No paralabral abnormality.

Joint or bursal effusion

Joint effusion: No significant hip joint effusion.

Bursae: Bilateral peritrochanteric edema without significant bursal
fluid.

Muscles and tendons

Muscles and tendons: Bilateral gluteus minimus tendinosis and
partial tearing. Partial tears of the bilateral hamstring tendon
origins. The iliopsoas tendons are unremarkable. Atrophy of the
bilateral gluteus minimus and right gluteus medius muscles. Mild
edema in the right obturator externus muscle.

Other findings

Miscellaneous: Prior hysterectomy. The visualized internal pelvic
contents appear unremarkable.
IMPRESSION: 1. No acute abnormality or significant degenerative changes of the
hips.
2. Bilateral gluteus minimus tendinosis and partial tearing.
3. Partial tears of the bilateral hamstring tendon origins.

## 2021-04-03 ENCOUNTER — Other Ambulatory Visit: Payer: Self-pay

## 2021-04-03 ENCOUNTER — Telehealth: Payer: Self-pay

## 2021-04-03 ENCOUNTER — Ambulatory Visit (INDEPENDENT_AMBULATORY_CARE_PROVIDER_SITE_OTHER): Payer: Medicare Other | Admitting: Pulmonary Disease

## 2021-04-03 ENCOUNTER — Encounter: Payer: Self-pay | Admitting: Pulmonary Disease

## 2021-04-03 VITALS — BP 140/80 | HR 67 | Temp 98.0°F | Ht 60.0 in | Wt 155.4 lb

## 2021-04-03 DIAGNOSIS — J4550 Severe persistent asthma, uncomplicated: Secondary | ICD-10-CM | POA: Diagnosis not present

## 2021-04-03 DIAGNOSIS — J3089 Other allergic rhinitis: Secondary | ICD-10-CM

## 2021-04-03 NOTE — Telephone Encounter (Signed)
Patient needs a refill on Nucala. Per Dr Patsey Berthold she wanted to check into this. Is there anything we need to do?

## 2021-04-03 NOTE — Patient Instructions (Signed)
Continue your Nucala.  We will check with our pharmacy team to make sure your current on everything needed.  Continue your Wixela.  We will see you in 6 months time call sooner should any new problems arise.

## 2021-04-03 NOTE — Progress Notes (Signed)
Subjective:    Patient ID: Anita Kent, female    DOB: 02-Feb-1941, 81 y.o.   MRN: [id number]  Patient Care Team: Marinda Elk, MD as PCP - General (Physician Assistant) Clent Jacks, RN as Registered Nurse Tyler Pita, MD as Consulting Physician (Pulmonary Disease)  Chief Complaint  Patient presents with   Follow-up   PT PROFILE: 80y.o. F never smoker with history of asthma, previously patient of Dr. Stevenson Clinch and Dr. Alva Garnet.  Started on Nucala in 2019 by Dr. Alva Garnet.   DATA: 05/08/14 PFTs: FVC: 2.07 > 2.12 L (88 > 90 %pred), FEV1: 1.43 > 1.60 L (82 > 92 %pred), FEV1/FVC: 69%, TLC: 4.34 L (97 %pred), DLCO 67 %pred, DLCO/VA 81% predicted 09/24/16 CT chest: Peripheral wedge-shaped area of subsegmental atelectasis noted within the anterior left upper lobe. Scar versus subsegmental atelectasis noted within the posteromedial right lung base and right middle lobe 11/18/17 PFTs: FVC: 1.56 > 1.61 L (69 > 71 %pred), FEV1: 1.03 > 1.00 L (59 > 57 %pred), FEV1/FVC: 66%, TLC: 4.90 L (109 %pred), DLCO measurement invalid 08/26/2018 2D echo: Mild DD, LVEF 50 to 55%.  No evidence of pulmonary hypertension. 05/15/2020 PFTs: FEV1 1.15 L or 74% predicted, FVC 1.65 L or 78% predicted, no bronchodilator response.  FEV1/FVC 70% lung volumes normal, no air trapping DLCO/VA 93%   INTERVAL:  Last seen on 15 October 2020 by me.  No major exacerbations since then.  Did have COVID-19 in December 2022.  No major issues, mostly fatigue but no respiratory symptoms.   HPI Patient is an 81 year old lifelong never smoker with persistent asthma controlled with Nucala and Advair (Wixela) she also uses as needed albuterol. This is a scheduled visit. She is self injecting Nucala every 4 weeks. Doing well with this.  He complains mostly of fatigue and lack of stamina these are chronic issues. She is not having difficulties obtaining Wixela. No cough. Rare use of albuterol inhaler maybe once a month if  not much.  Prescriptions are up-to-date on Wixela, Nucala and albuterol. She does not endorse any other complaint except for nasal congestion worse in the mornings with some postnasal drip. No chest pain, no orthopnea or paroxysmal nocturnal dyspnea. No lower extremity edema. Otherwise feels well and looks well.    Review of Systems A 10 point review of systems was performed and it is as noted above otherwise negative.  Patient Active Problem List   Diagnosis Date Noted   Carotid stenosis 09/15/2018   CAD (coronary artery disease) 09/15/2018   Hyperlipidemia 09/15/2018   Essential hypertension 09/15/2018   Chest pain 08/25/2018   Endometrial cancer, FIGO stage IIIA ([place]) 09/16/2016   Sinusitis, acute 08/13/2014   Emphysema lung ([place]) 05/08/2014   Asthma, chronic 03/06/2014   Cough 03/06/2014   Social History   Tobacco Use   Smoking status: Never   Smokeless tobacco: Never  Substance Use Topics   Alcohol use: No    Alcohol/week: 0.0 standard drinks   Allergies  Allergen Reactions   Oxycodone Itching   Sulfa Antibiotics Hives   Amoxicillin Hives and Rash    Has patient had a PCN reaction causing immediate rash, facial/tongue/throat swelling, SOB or lightheadedness with hypotension: Yes Has patient had a PCN reaction causing severe rash involving mucus membranes or skin necrosis: Yes Has patient had a PCN reaction that required hospitalization: No Has patient had a PCN reaction occurring within the last 10 years: Yes If all of the above answers are "NO",  then may proceed with Cephalosporin use.    Levaquin [Levofloxacin In D5w] Hives   Current Meds  Medication Sig   albuterol (PROVENTIL) (2.5 MG/3ML) 0.083% nebulizer solution Take 3 mLs (2.5 mg total) by nebulization every 6 (six) hours as needed for wheezing or shortness of breath.   Albuterol Sulfate (PROAIR RESPICLICK) 123XX123 (90 Base) MCG/ACT AEPB Inhale 2 puffs into the lungs every 6 (six) hours as needed.   atorvastatin  (LIPITOR) 40 MG tablet Take 1 tablet (40 mg total) by mouth daily at 6 PM.   ibuprofen (ADVIL,MOTRIN) 200 MG tablet Take 400 mg by mouth as needed for headache or moderate pain.    levothyroxine (SYNTHROID, LEVOTHROID) 75 MCG tablet Take 75 mcg by mouth daily before breakfast.   losartan (COZAAR) 50 MG tablet Take 50 mg by mouth daily.   Mepolizumab (NUCALA) 100 MG/ML SOAJ INJECT 100MG SUBCUTANEOUSLY EVERY 4 WEEKS   nitroGLYCERIN (NITROSTAT) 0.4 MG SL tablet Place 1 tablet (0.4 mg total) under the tongue every 5 (five) minutes as needed for chest pain.   omeprazole (PRILOSEC OTC) 20 MG tablet Take 2 tablets (40 mg total) by mouth at bedtime.   Probiotic CAPS Take 1 capsule by mouth daily.   WIXELA INHUB 250-50 MCG/ACT AEPB INHALE 1 PUFF BY MOUTH TWICE DAILY   [DISCONTINUED] megestrol (MEGACE) 40 MG tablet Take 1 tablet (40 mg total) by mouth 2 (two) times daily.   Immunization History  Administered Date(s) Administered   Influenza Split 01/04/2014   Influenza-Unspecified 12/25/2013   PFIZER Comirnaty(Gray Top)Covid-19 Tri-Sucrose Vaccine 03/24/2019, 04/14/2019, 01/03/2020   PFIZER(Purple Top)SARS-COV-2 Vaccination 03/24/2019, 04/14/2019, 01/03/2020   Pneumococcal Polysaccharide-23 01/04/2014   Pneumococcal-Unspecified 12/25/2013   Tdap 06/15/2017       Objective:   Physical Exam BP 140/80 (BP Location: Left Arm, Patient Position: Sitting, Cuff Size: Normal)   Pulse 67   Temp 98 F (36.7 C) (Oral)   Ht 5' (1.524 m)   Wt 155 lb 6.4 oz (70.5 kg)   SpO2 99%   BMI 30.35 kg/m  GENERAL: Well-developed, overweight elderly woman, quite spry.  Age-appropriate.  No acute distress.  Ambulatory with assistance of a cane. HEAD: Normocephalic, atraumatic.  EYES: Pupils equal, round, reactive to light.  No scleral icterus.  MOUTH: Nose/mouth/throat not examined due to masking requirements for COVID 19. NECK: Supple. No thyromegaly. Trachea midline. No JVD.  No adenopathy. PULMONARY: Good air  entry bilaterally.  Mild wheezing in the upper lung zones, no other adventitious sounds. CARDIOVASCULAR: S1 and S2. Regular rate and rhythm.  No rubs, murmurs or gallops heard. ABDOMEN: Benign. 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  NEUROLOGIC: No overt focal deficit, gait assisted with cane, fluent speech. SKIN: Intact,warm,dry.  On limited exam, no rashes. PSYCH: Mood and behavior is normal.      Assessment & Plan:     ICD-10-CM   1. 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 Perennial allergic rhinitis  J30.89    Nucala helping with this as well Well-controlled Nasal hygiene      Patient appears well compensated.  Continue current regimen.  Will see her in follow-up in 6 months time she is to contact us prior to that time should any new difficulties arise.  Anita Don, MD Advanced Bronchoscopy PCCM [facility]    *  This note was dictated using voice recognition software/Dragon.  Despite best efforts to proofread, errors can occur which can change the meaning. Any transcriptional errors that result from this process are unintentional and may not be fully corrected at the time of dictation.

## 2021-04-04 MED ORDER — NUCALA 100 MG/ML ~~LOC~~ SOAJ: SUBCUTANEOUS | 5 refills | Status: DC

## 2021-04-04 NOTE — Telephone Encounter (Signed)
Refill sent for [facility] to [place] for Nucala: [phone]  Dose: 100 mg SQ every 4 weeks  Last OV: 04/03/21 Provider: Dr. Patsey Berthold  Next OV: 6 months  Knox Saliva, PharmD, MPH, BCPS Clinical Pharmacist (Rheumatology and Pulmonology)

## 2021-06-17 ENCOUNTER — Other Ambulatory Visit: Payer: Self-pay | Admitting: Physician Assistant

## 2021-06-17 ENCOUNTER — Other Ambulatory Visit: Payer: Self-pay | Admitting: Internal Medicine

## 2021-06-17 DIAGNOSIS — M12812 Other specific arthropathies, not elsewhere classified, left shoulder: Secondary | ICD-10-CM

## 2021-06-25 ENCOUNTER — Ambulatory Visit
Admission: RE | Admit: 2021-06-25 | Discharge: 2021-06-25 | Disposition: A | Discharge status: Home / Self Care | Payer: Medicare Other | Source: Ambulatory Visit | Attending: Physician Assistant | Admitting: Physician Assistant

## 2021-06-25 DIAGNOSIS — M12812 Other specific arthropathies, not elsewhere classified, left shoulder: Secondary | ICD-10-CM | POA: Insufficient documentation

## 2021-07-21 ENCOUNTER — Telehealth: Payer: Self-pay | Admitting: Pulmonary Disease

## 2021-07-21 DIAGNOSIS — J4550 Severe persistent asthma, uncomplicated: Secondary | ICD-10-CM

## 2021-07-21 MED ORDER — NUCALA 100 MG/ML ~~LOC~~ SOAJ: SUBCUTANEOUS | 5 refills | Status: DC

## 2021-07-21 NOTE — Telephone Encounter (Signed)
Nucala has been sent to preferred pharmacy.  Patient is aware and voiced her understanding.  Nothing further needed.

## 2021-08-20 ENCOUNTER — Telehealth: Payer: Self-pay | Admitting: Pulmonary Disease

## 2021-08-20 DIAGNOSIS — J4550 Severe persistent asthma, uncomplicated: Secondary | ICD-10-CM

## 2021-08-21 ENCOUNTER — Telehealth: Payer: Self-pay | Admitting: Pulmonary Disease

## 2021-08-21 DIAGNOSIS — J4550 Severe persistent asthma, uncomplicated: Secondary | ICD-10-CM

## 2021-08-21 MED ORDER — NUCALA 100 MG/ML ~~LOC~~ SOAJ: SUBCUTANEOUS | 2 refills | Status: DC

## 2021-08-21 NOTE — Telephone Encounter (Addendum)
Please refer to 08/20/2021 phone note.   Routing to pharmacy team.

## 2021-08-21 NOTE — Telephone Encounter (Signed)
Resent prescription for Nucala to [facility]. Last OV on 04/03/21 with Dr. Ardelia Mems, PharmD, MPH, BCPS, CPP Clinical Pharmacist (Rheumatology and Pulmonology)

## 2021-08-21 NOTE — Telephone Encounter (Signed)
Per 6/5 encounter, e-script ran into an issue with the pharmacy that was selected to receive. See below  Last Rx sent 04/04/21 with 5 additional refills. Most recent OV was 04/03/21, no f/u appt is currently scheduled with Pulmonology.

## 2021-08-22 MED ORDER — NUCALA 100 MG/ML ~~LOC~~ SOAJ: SUBCUTANEOUS | 2 refills | Status: DC

## 2021-08-22 NOTE — Telephone Encounter (Signed)
Rx signed and faxed to [facility]. Received fax confirmation.  Patient is aware and voiced her understanding.  Nothing further needed.

## 2021-08-22 NOTE — Telephone Encounter (Signed)
Rx printed and placed in Dr. Patsey Berthold folder for signature.

## 2021-08-22 NOTE — Telephone Encounter (Signed)
Called [facility] [place] to confirm they have Nucala rx on file. Per rep, they do not have rx that was e-scribed yesterday. Per rep, a prescription can be printed and faxed to the pharmacy.  Phone: [phone], option 1, option 2 Fax: [phone] and [phone] (Preferred)  Knox Saliva, PharmD, MPH, BCPS, CPP Clinical Pharmacist (Rheumatology and Pulmonology)

## 2021-08-22 NOTE — Telephone Encounter (Signed)
ATC patient x3-received busy signal.  Will call back.  Pharmacy team, please advise. Thanks

## 2021-09-30 ENCOUNTER — Telehealth: Payer: Self-pay | Admitting: Pulmonary Disease

## 2021-09-30 MED ORDER — AZITHROMYCIN 250 MG PO TABS: ORAL_TABLET | ORAL | 0 refills | Status: AC

## 2021-09-30 MED ORDER — METHYLPREDNISOLONE 4 MG PO TBPK
ORAL_TABLET | ORAL | 0 refills | Status: DC
Start: 2021-09-30 — End: 2021-10-15

## 2021-09-30 NOTE — Telephone Encounter (Signed)
Spoke to patient.  C/o wheezing, increased SOB and sinus drainage x3d. Nucala once monthly. Last injection two weeks ago.  Using Wixela BID and albuterol once daily.   Dr. Patsey Berthold, please advise. Thanks

## 2021-09-30 NOTE — Telephone Encounter (Signed)
Make sure she tests herself for COVID with a home test.  Call if positive.  Otherwise can call in Medrol Dosepak and azithromycin Dosepak.  If her symptoms worsen she should go to  ED.

## 2021-09-30 NOTE — Telephone Encounter (Signed)
Spoke to patient and relayed below message/recommendations.  She will call back with results of covid test.

## 2021-09-30 NOTE — Telephone Encounter (Signed)
Spoke to patient.  She stated that covid test was negative. I have made Dr. Patsey Berthold aware of this verbally.  Zpak and medrol has been sent to preferred pharmacy. Nothing further needed.

## 2021-10-13 ENCOUNTER — Telehealth: Payer: Self-pay | Admitting: Pulmonary Disease

## 2021-10-13 NOTE — Telephone Encounter (Signed)
The Medrol Dosepak provided is not a lower dose than the prednisone.  It is the equivalent and is a standard taper dose.  If she is still having issues she needs to be seen as an acute visit.

## 2021-10-13 NOTE — Telephone Encounter (Signed)
Patient states she is still having the same problems from 8/15- shortness of breath and wheezing. States 6 days of prednisone was called in for a lower dose then she usually gets and it did not knock it out. Patient uses [facility] in [place]. Please advise, call back at [phone]

## 2021-10-13 NOTE — Telephone Encounter (Signed)
Spoke to patient and relayed below message/recommendations. She voiced her understanding.  I offered acute visit. She stated that she would call back to schedule if sx persist.  Nothing further needed at this time.

## 2021-10-13 NOTE — Telephone Encounter (Signed)
Spoke to patient.  She stated that she completed course of medrol that was prescribed on 09/30/21. Sx improved but did not completely subside.  C/o increased SOB, wheezing, prod cough with yellow sputum and chest congestion. Neg covid test last week. She feels that prednisone needs to be extended.  Using albuterol HFA PRN and wixela BID.   Dr. Patsey Berthold, please advise. Thanks

## 2021-10-15 ENCOUNTER — Ambulatory Visit
Admission: RE | Admit: 2021-10-15 | Discharge: 2021-10-15 | Disposition: A | Discharge status: Home / Self Care | Payer: Medicare Other | Source: Ambulatory Visit | Attending: Emergency Medicine | Admitting: Emergency Medicine

## 2021-10-15 ENCOUNTER — Ambulatory Visit (INDEPENDENT_AMBULATORY_CARE_PROVIDER_SITE_OTHER): Payer: Medicare Other

## 2021-10-15 VITALS — BP 150/51 | HR 76 | Temp 97.8°F | Resp 16 | Ht 60.0 in | Wt 150.0 lb

## 2021-10-15 DIAGNOSIS — R062 Wheezing: Secondary | ICD-10-CM

## 2021-10-15 DIAGNOSIS — R059 Cough, unspecified: Secondary | ICD-10-CM | POA: Diagnosis not present

## 2021-10-15 DIAGNOSIS — J441 Chronic obstructive pulmonary disease with (acute) exacerbation: Secondary | ICD-10-CM

## 2021-10-15 MED ORDER — IPRATROPIUM-ALBUTEROL 0.5-2.5 (3) MG/3ML IN SOLN
3.0000 mL | Freq: Once | RESPIRATORY_TRACT | Status: AC
  Administered 2021-10-15: 3 mL via RESPIRATORY_TRACT

## 2021-10-15 MED ORDER — PREDNISONE 20 MG PO TABS: ORAL_TABLET | ORAL | 0 refills | Status: DC

## 2021-10-15 MED ORDER — LEVOFLOXACIN 500 MG PO TABS
500.0000 mg | ORAL_TABLET | Freq: Every day | ORAL | 0 refills | Status: AC
Start: 2021-10-15 — End: 2021-10-20

## 2021-10-15 MED ORDER — AEROCHAMBER MV MISC: 1 refills | Status: AC

## 2021-10-15 NOTE — ED Provider Notes (Signed)
HPI  SUBJECTIVE:  Anita Kent is a 81 y.o. female who presents with over 2 weeks of wheezing, nasal congestion, occasional postnasal drip, cough productive of yellowish sputum, shortness of breath with wheezing and decreased exercise tolerance/dyspnea on exertion.  She denies fevers, chest pain, GERD symptoms, sinus pain or pressure, rhinorrhea, facial swelling, upper dental pain.  She called her pulmonologist on 8/14 for the symptoms, was prescribed a 6-day Medrol Dosepak and a Z-Pak, which patient states did not help.  She is compliant with her Grant Ruts, is on Nucala.  She is only using her albuterol HFA as needed.  She does not have a spacer for her albuterol HFA.  There are no alleviating factors.  Symptoms are worse with being active. 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  [facility]P: [facility].  Pulmonology: [facility] pulmonology in [place].  Past Medical History:  Diagnosis Date   Asthma    COPD (chronic obstructive pulmonary disease) ([place])    Cough variant asthma    Diabetes ([place])    states was told she was not diabetic tested high once on hemoglobin A1C   Dyslipidemia    Emphysema of lung ([place])    endometrial cancer 08/2016   Total Hysterectomy 09/04/2016   HTN (hypertension)    Hypertension    Hypothyroidism    Thyroid disease     Past Surgical History:  Procedure Laterality Date   ABDOMINAL HYSTERECTOMY     CATARACT EXTRACTION, BILATERAL     CYSTOSCOPY N/A 09/04/2016   Procedure: CYSTOSCOPY;  Surgeon: Ward, Honor Loh, MD;  Location: [facility] ORS;  Service: Gynecology;  Laterality: N/A;   DILATATION & CURETTAGE/HYSTEROSCOPY WITH MYOSURE N/A 09/04/2016   Procedure: DILATATION & CURETTAGE/HYSTEROSCOPY WITH MYOSURE;  Surgeon: Ward, Honor Loh, MD;  Location: [facility] ORS;  Service: Gynecology;  Laterality: N/A;   GANGLION CYST EXCISION Left    hand   LAPAROSCOPIC  BILATERAL SALPINGO OOPHERECTOMY Bilateral 09/04/2016   Procedure: LAPAROSCOPIC BILATERAL SALPINGO OOPHORECTOMY;  Surgeon: Ward, Honor Loh, MD;  Location: [facility] ORS;  Service: Gynecology;  Laterality: Bilateral;   LAPAROSCOPIC HYSTERECTOMY  09/04/2016   Procedure: HYSTERECTOMY TOTAL LAPAROSCOPIC;  Surgeon: Maceo Pro, MD;  Location: [facility] ORS;  Service: Gynecology;;   primary open reduction procedure Right 11/09/2013    right ankle   TONSILLECTOMY     TUBAL LIGATION      Family History  Problem Relation Age of Onset   Myasthenia gravis Daughter    Emphysema Mother    COPD Mother    Dementia Mother    Stroke Father    Heart attack Sister     Social History   Tobacco Use   Smoking status: Never   Smokeless tobacco: Never  Vaping Use   Vaping Use: Never used  Substance Use Topics   Alcohol use: No    Alcohol/week: 0.0 standard drinks of alcohol   Drug use: No    No current facility-administered medications for this encounter.  Current Outpatient Medications:    albuterol (PROVENTIL) (2.5 MG/3ML) 0.083% nebulizer solution, Take 3 mLs (2.5 mg total) by nebulization every 6 (six) hours as needed for wheezing or shortness of breath., Disp: 150 mL, Rfl: 5   Albuterol Sulfate (PROAIR RESPICLICK) 491 (90 Base) MCG/ACT AEPB, Inhale 2 puffs into the lungs every 6 (six) hours as needed., Disp: 1 each, Rfl: 6   atorvastatin (LIPITOR) 40 MG tablet, Take 1 tablet (40 mg total) by  mouth daily at 6 PM., Disp: 30 tablet, Rfl: 0   ibuprofen (ADVIL,MOTRIN) 200 MG tablet, Take 400 mg by mouth as needed for headache or moderate pain. , Disp: , Rfl:    levofloxacin (LEVAQUIN) 500 MG tablet, Take 1 tablet (500 mg total) by mouth daily for 5 days., Disp: 5 tablet, Rfl: 0   levothyroxine (SYNTHROID, LEVOTHROID) 75 MCG tablet, Take 75 mcg by mouth daily before breakfast., Disp: , Rfl:    losartan (COZAAR) 50 MG tablet, Take 50 mg by mouth daily., Disp: , Rfl:    Mepolizumab (NUCALA) 100 MG/ML SOAJ,  INJECT '100MG'$  SUBCUTANEOUSLY EVERY 4 WEEKS, Disp: 1 mL, Rfl: 2   nitroGLYCERIN (NITROSTAT) 0.4 MG SL tablet, Place 1 tablet (0.4 mg total) under the tongue every 5 (five) minutes as needed for chest pain., Disp: 30 tablet, Rfl: 12   omeprazole (PRILOSEC OTC) 20 MG tablet, Take 2 tablets (40 mg total) by mouth at bedtime., Disp: 60 tablet, Rfl: 10   predniSONE (DELTASONE) 20 MG tablet, 40 mg once daily of prednisone for 2 days, 20 mg once daily for 5 days, Disp: 9 tablet, Rfl: 0   Probiotic CAPS, Take 1 capsule by mouth daily., Disp: , Rfl:    Spacer/Aero-Holding Chambers (AEROCHAMBER MV) inhaler, Use as instructed, Disp: 1 each, Rfl: 1   WIXELA INHUB 250-50 MCG/ACT AEPB, INHALE 1 PUFF BY MOUTH TWICE DAILY, Disp: 60 each, Rfl: 11  Allergies  Allergen Reactions   Oxycodone Itching   Sulfa Antibiotics Hives   Amoxicillin Hives and Rash    Has patient had a PCN reaction causing immediate rash, facial/tongue/throat swelling, SOB or lightheadedness with hypotension: Yes Has patient had a PCN reaction causing severe rash involving mucus membranes or skin necrosis: Yes Has patient had a PCN reaction that required hospitalization: No Has patient had a PCN reaction occurring within the last 10 years: Yes If all of the above answers are "NO", then may proceed with Cephalosporin use.    Levaquin [Levofloxacin In D5w] Hives     ROS  As noted in HPI.   Physical Exam  BP (!) 150/51 (BP Location: Left Arm)   Pulse 76   Temp 97.8 F (36.6 C) (Oral)   Resp 16   Ht 5' (1.524 m)   Wt 68 kg   SpO2 97%   BMI 29.29 kg/m   Constitutional: Well developed, well nourished, no acute distress Eyes:  EOMI, conjunctiva normal bilaterally HENT: Normocephalic, atraumatic,mucus membranes moist.  No nasal congestion.  Normal turbinates.  No maxillary, frontal sinus tenderness.  No postnasal drip. Respiratory: Normal inspiratory effort, fair air movement, faint expiratory wheezing base of left lung.  No  anterior, lateral chest wall tenderness Cardiovascular: Normal rate, regular rhythm, no murmurs rubs or gallops GI: nondistended skin: No rash, skin intact Musculoskeletal: no deformities Neurologic: Alert & oriented x 3, no focal neuro deficits Psychiatric: Speech and behavior appropriate   ED Course   Medications  ipratropium-albuterol (DUONEB) 0.5-2.5 (3) MG/3ML nebulizer solution 3 mL (3 mLs Nebulization Given 10/15/21 1301)    Orders Placed This Encounter  Procedures   DG Chest 2 View    Standing Status:   Standing    Number of Occurrences:   1    Order Specific Question:   Reason for Exam (SYMPTOM  OR DIAGNOSIS REQUIRED)    Answer:   Productive cough, history of COPD, rule out pneumonia, pulmonary edema, pleural effusion    No results found for this or any previous visit (from  the past 24 hour(s)). DG Chest 2 View  Result Date: 10/15/2021 CLINICAL DATA:  Provided history: Productive cough. Wheezing. History of COPD and asthma. Rule out pneumonia, pulmonary edema, pleural effusion. EXAM: CHEST - 2 VIEW COMPARISON:  Prior chest radiographs 07/04/2019 and earlier. FINDINGS: Heart size within normal limits. Aortic atherosclerosis. No appreciable airspace consolidation or pulmonary edema. No evidence of pleural effusion or pneumothorax. Degenerative changes of the spine. Moderate-sized hiatal hernia. IMPRESSION: No evidence of acute cardiopulmonary abnormality. Aortic Atherosclerosis (ICD10-I70.0). Moderate-sized hiatal hernia. Electronically Signed   By: Kellie Simmering D.O.   On: 10/15/2021 13:20    ED Clinical Impression  1. COPD exacerbation [facility])      ED Assessment/Plan  Outside records reviewed.  As noted in HPI.  Patient called her pulmonologist for wheezing on 8/15.  She was treated with a Z-Pak and Medrol dose pack.  She called them back on 8/28, and was advised to come in.  Patient states that she was told to come here for evaluation.  I suspect a COPD exacerbation.   There is no evidence of sinusitis.  She is in no respiratory distress, satting well on room air, and is afebrile, but she did take an antipyretic within 6 hours of evaluation.  Will check chest x-ray, give DuoNeb.    Reviewed imaging independently.  No pleural effusion, pneumothorax, pulmonary edema or consolidation.  Moderate sized hiatal hernia.  See radiology report for full details.  On reevaluation post DuoNeb, patient states that she feels better.  She has significantly improved air movement, loud wheezing, particularly on the left side.  Discussed case with her pulmonologist.  Agrees with regularly scheduled bronchodilators, Augmentin, and recommends 40 mg of prednisone for 2 days and then 20 mg for 5 days thereafter.   Patient reports hives with amoxicillin and sulfa.  Levaquin is reported as an allergy as well, but she does not remember having allergic reaction to it.  This is our only other option for COPD exacerbation at this time due to recent antibiotic use.  She is willing to try it.  Discussed imaging, MDM, treatment plan, and plan for follow-up with patient. Discussed sn/sx that should prompt return to the ED. patient agrees with plan.   Meds ordered this encounter  Medications   ipratropium-albuterol (DUONEB) 0.5-2.5 (3) MG/3ML nebulizer solution 3 mL   Spacer/Aero-Holding Chambers (AEROCHAMBER MV) inhaler    Sig: Use as instructed    Dispense:  1 each    Refill:  1   predniSONE (DELTASONE) 20 MG tablet    Sig: 40 mg once daily of prednisone for 2 days, 20 mg once daily for 5 days    Dispense:  9 tablet    Refill:  0   levofloxacin (LEVAQUIN) 500 MG tablet    Sig: Take 1 tablet (500 mg total) by mouth daily for 5 days.    Dispense:  5 tablet    Refill:  0      *This clinic note was created using Lobbyist. Therefore, there may be occasional mistakes despite careful proofreading.  ?    Melynda Ripple, MD 10/15/21 1635

## 2021-10-15 NOTE — Discharge Instructions (Addendum)
2 puffs from your albuterol inhaler using your spacer every 4 hours for 2 days, then every 6 hours for 2 days, then as needed.  You can back off on the albuterol if you start to feel better sooner.  Take the prednisone as directed, and finish the Levaquin, unless your pulmonologist tells you to stop it.  Stop the Levaquin immediately if you get hives or rash, and let your pulmonologist know.

## 2021-10-15 NOTE — ED Triage Notes (Signed)
Pt c/o wheezing, cough onset x2 weeks , reports hx of asthma, pt states her pulmonologist sent her in something on 8/14 but states did not clear up.

## 2021-11-24 ENCOUNTER — Telehealth: Payer: Self-pay | Admitting: Pulmonary Disease

## 2021-11-24 MED ORDER — FLUTICASONE-SALMETEROL 250-50 MCG/ACT IN AEPB
1.0000 | INHALATION_SPRAY | Freq: Two times a day (BID) | RESPIRATORY_TRACT | 3 refills | Status: DC
Start: 2021-11-24 — End: 2021-12-01

## 2021-11-24 NOTE — Telephone Encounter (Signed)
Called and spoke with patient, advised that she was seen back in February of this year and I would send in refills of her Grant Ruts to her pharmacy [facility] in [place].  Nothing further needed.

## 2021-11-30 ENCOUNTER — Other Ambulatory Visit: Payer: Self-pay | Admitting: Pulmonary Disease

## 2021-12-02 ENCOUNTER — Ambulatory Visit: Payer: Medicare Other | Admitting: Pulmonary Disease

## 2021-12-15 ENCOUNTER — Encounter (INDEPENDENT_AMBULATORY_CARE_PROVIDER_SITE_OTHER): Payer: Self-pay

## 2021-12-22 ENCOUNTER — Telehealth: Payer: Self-pay | Admitting: Pulmonary Disease

## 2021-12-22 DIAGNOSIS — J4550 Severe persistent asthma, uncomplicated: Secondary | ICD-10-CM

## 2021-12-22 NOTE — Telephone Encounter (Signed)
Called and spoke to patient.  She stated that patient assistance is needing Nucala Rx for the rest of the year.  Pharmacy team, please advise. Thanks

## 2021-12-24 NOTE — Telephone Encounter (Signed)
I have printed the forms for the patient and will give them to her at her appointment. Nothing further is needed.

## 2021-12-24 NOTE — Telephone Encounter (Signed)
Refill sent for [facility] to [place] for [place]: [phone] (via fax [phone])  Dose: 100 mg SQ every 4 weeks  Last OV: 04/03/21 Provider: Dr. Patsey Berthold  Next OV: 6 months - 12/31/21  Patient advised to complete Nucala PAP paperwork for 2024 enrollment at [employer] on 12/31/21.  Application can be found at: [email or web address]   Knox Saliva, PharmD, MPH, BCPS Clinical Pharmacist (Rheumatology and Pulmonology)

## 2021-12-31 ENCOUNTER — Encounter: Payer: Self-pay | Admitting: Pulmonary Disease

## 2021-12-31 ENCOUNTER — Ambulatory Visit (INDEPENDENT_AMBULATORY_CARE_PROVIDER_SITE_OTHER): Payer: Medicare Other | Admitting: Pulmonary Disease

## 2021-12-31 VITALS — BP 136/80 | HR 75 | Temp 97.8°F | Ht 60.0 in | Wt 153.0 lb

## 2021-12-31 DIAGNOSIS — R0602 Shortness of breath: Secondary | ICD-10-CM

## 2021-12-31 DIAGNOSIS — K449 Diaphragmatic hernia without obstruction or gangrene: Secondary | ICD-10-CM

## 2021-12-31 DIAGNOSIS — J3089 Other allergic rhinitis: Secondary | ICD-10-CM | POA: Diagnosis not present

## 2021-12-31 DIAGNOSIS — R0982 Postnasal drip: Secondary | ICD-10-CM

## 2021-12-31 DIAGNOSIS — J4550 Severe persistent asthma, uncomplicated: Secondary | ICD-10-CM

## 2021-12-31 LAB — NITRIC OXIDE: Nitric Oxide: 10

## 2021-12-31 NOTE — Patient Instructions (Signed)
Your airway inflammation is well controlled on the Nucala.  Continue using your Wixela and the as needed albuterol.  We will see you in follow-up in 6 months time call sooner should any new problems arise.

## 2021-12-31 NOTE — Progress Notes (Signed)
Subjective:    Patient ID: Anita Kent, female    DOB: 1940-03-02, 81 y.o.   MRN: [id number] Patient Care Team: Marinda Elk, MD as PCP - General (Physician Assistant) Clent Jacks, RN as Registered Nurse Tyler Pita, MD as Consulting Physician (Pulmonary Disease)  Chief Complaint  Patient presents with   Follow-up    SOB with exertion. No wheezing. Nasal congestion. Dry cough.   PT PROFILE: 81y.o. F never smoker with history of asthma, previously patient of Dr. Stevenson Clinch and Dr. Alva Garnet.  Started on Nucala in 2019 by Dr. Alva Garnet.   DATA: 05/08/14 PFTs: FVC: 2.07 > 2.12 L (88 > 90 %pred), FEV1: 1.43 > 1.60 L (82 > 92 %pred), FEV1/FVC: 69%, TLC: 4.34 L (97 %pred), DLCO 67 %pred, DLCO/VA 81% predicted 09/24/16 CT chest: Peripheral wedge-shaped area of subsegmental atelectasis noted within the anterior left upper lobe. Scar versus subsegmental atelectasis noted within the posteromedial right lung base and right middle lobe 11/18/17 PFTs: FVC: 1.56 > 1.61 L (69 > 71 %pred), FEV1: 1.03 > 1.00 L (59 > 57 %pred), FEV1/FVC: 66%, TLC: 4.90 L (109 %pred), DLCO measurement invalid 08/26/2018 2D echo: Mild DD, LVEF 50 to 55%.  No evidence of pulmonary hypertension. 05/15/2020 PFTs: FEV1 1.15 L or 74% predicted, FVC 1.65 L or 78% predicted, no bronchodilator response.  FEV1/FVC 70% lung volumes normal, no air trapping DLCO/VA 93% 10/15/2021 chest x-ray lateral: No acute cardiopulmonary disease, hyperinflation, hiatal hernia present   INTERVAL:  Seen last on 03 April 2021 by me.  No major exacerbations since then.  Some nasal congestion which she gets usually in the fall months.    HPI Patient is an 81 year old lifelong never smoker with severe persistent asthma controlled with Nucala and Advair (Wixela) she also uses as needed albuterol. This is a scheduled visit. She is self injecting Nucala every 4 weeks. Doing well with this.  She does not endorse any wheezing or cough.   She does have chronic nasal congestion but this is fairly well-controlled with nasal hygiene. She is not having difficulties obtaining Wixela. Rare use of albuterol inhaler. No chest pain, no orthopnea or paroxysmal nocturnal dyspnea. No lower extremity edema.  She has dyspnea on heavy exertion at baseline but this has not changed for years now.  He does have a large hiatal hernia however is asymptomatic in this regard with the use of Prilosec and antireflux measures.  Otherwise feels well and looks well.    Review of Systems A 10 point review of systems was performed and it is as noted above otherwise negative.  Patient Active Problem List   Diagnosis Date Noted   Hiatal hernia 01/19/2022   Carotid stenosis 09/15/2018   CAD (coronary artery disease) 09/15/2018   Hyperlipidemia 09/15/2018   Essential hypertension 09/15/2018   Chest pain 08/25/2018   Endometrial cancer, FIGO stage IIIA ([place]) 09/16/2016   Sinusitis, acute 08/13/2014   Emphysema lung ([place]) 05/08/2014   Asthma, chronic 03/06/2014   Cough 03/06/2014   Social History   Tobacco Use   Smoking status: Never   Smokeless tobacco: Never  Substance Use Topics   Alcohol use: No    Alcohol/week: 0.0 standard drinks of alcohol   Allergies  Allergen Reactions   Oxycodone Itching   Sulfa Antibiotics Hives   Amoxicillin Hives and Rash    Has patient had a PCN reaction causing immediate rash, facial/tongue/throat swelling, SOB or lightheadedness with hypotension: Yes Has patient had a PCN reaction causing  severe rash involving mucus membranes or skin necrosis: Yes Has patient had a PCN reaction that required hospitalization: No Has patient had a PCN reaction occurring within the last 10 years: Yes If all of the above answers are "NO", then may proceed with Cephalosporin use.    Levaquin [Levofloxacin In D5w] Hives   Current Meds  Medication Sig   albuterol (PROVENTIL) (2.5 MG/3ML) 0.083% nebulizer solution Take 3 mLs (2.5 mg  total) by nebulization every 6 (six) hours as needed for wheezing or shortness of breath.   Albuterol Sulfate (PROAIR RESPICLICK) 774 (90 Base) MCG/ACT AEPB Inhale 2 puffs into the lungs every 6 (six) hours as needed.   atorvastatin (LIPITOR) 40 MG tablet Take 1 tablet (40 mg total) by mouth daily at 6 PM.   hydrochlorothiazide (HYDRODIURIL) 12.5 MG tablet Take 12.5 mg by mouth daily.   ibuprofen (ADVIL,MOTRIN) 200 MG tablet Take 400 mg by mouth as needed for headache or moderate pain.    levothyroxine (SYNTHROID, LEVOTHROID) 75 MCG tablet Take 75 mcg by mouth daily before breakfast.   losartan (COZAAR) 50 MG tablet Take 50 mg by mouth daily.   meloxicam (MOBIC) 15 MG tablet Take 15 mg by mouth daily.   Mepolizumab (NUCALA) 100 MG/ML SOAJ INJECT '100MG'$  SUBCUTANEOUSLY EVERY 4 WEEKS   omeprazole (PRILOSEC OTC) 20 MG tablet Take 2 tablets (40 mg total) by mouth at bedtime.   predniSONE (DELTASONE) 20 MG tablet 40 mg once daily of prednisone for 2 days, 20 mg once daily for 5 days   Probiotic CAPS Take 1 capsule by mouth daily.   Spacer/Aero-Holding Chambers (AEROCHAMBER MV) inhaler Use as instructed   WIXELA INHUB 250-50 MCG/ACT AEPB INHALE 1 PUFF BY MOUTH TWICE DAILY   Immunization History  Administered Date(s) Administered   Influenza Split 01/04/2014   Influenza-Unspecified 12/25/2013   PFIZER Comirnaty(Gray Top)Covid-19 Tri-Sucrose Vaccine 03/24/2019, 04/14/2019, 01/03/2020   PFIZER(Purple Top)SARS-COV-2 Vaccination 03/24/2019, 04/14/2019, 01/03/2020   Pneumococcal Polysaccharide-23 01/04/2014   Pneumococcal-Unspecified 12/25/2013   Tdap 06/15/2017       Objective:   Physical Exam BP 136/80 (BP Location: Left Arm, Cuff Size: Normal)   Pulse 75   Temp 97.8 F (36.6 C)   Ht 5' (1.524 m)   Wt 153 lb (69.4 kg)   SpO2 100%   BMI 29.88 kg/m  GENERAL: Well-developed, overweight elderly woman, quite spry.  Age-appropriate.  No acute distress.  Ambulatory with assistance of a  cane. HEAD: Normocephalic, atraumatic.  EYES: Pupils equal, round, reactive to light.  No scleral icterus.  MOUTH: Nose/mouth/throat not examined due to masking requirements for COVID 19. NECK: Supple. No thyromegaly. Trachea midline. No JVD.  No adenopathy. PULMONARY: Good air entry bilaterally.  Mild wheezing in the upper lung zones, no other adventitious sounds. CARDIOVASCULAR: S1 and S2. Regular rate and rhythm.  No rubs, murmurs or gallops heard. ABDOMEN: Benign. 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  NEUROLOGIC: No overt focal deficit, gait assisted with cane, fluent speech. SKIN: Intact,warm,dry.  On limited exam, no rashes. PSYCH: Mood and behavior is normal.  Lab Results  Component Value Date   NITRICOXIDE 10 12/31/2021      Assessment & Plan:     ICD-10-CM   1. 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    2. Perennial allergic rhinitis  J30.89    Currently well-controlled Continue nasal hygiene Continue as  needed Zyrtec at nighttime      Orders Placed This Encounter  Procedures   Nitric oxide    Patient is well-controlled on Nucala.  Will continue Nucala therapy.  This has been controlling her asthma very well.  She has no evidence of type II inflammation on the airway today.  Will see the patient in follow-up in 6 months time call sooner should any new problems arise.   Renold Don, MD Advanced Bronchoscopy PCCM [facility]    *This note was dictated using voice recognition software/Dragon.  Despite best efforts to proofread, errors can occur which can change the meaning. Any transcriptional errors that result from this process are unintentional and may not be fully corrected at the time of dictation.

## 2022-01-19 ENCOUNTER — Encounter: Payer: Self-pay | Admitting: Pulmonary Disease

## 2022-01-19 ENCOUNTER — Telehealth: Payer: Self-pay | Admitting: Pharmacist

## 2022-01-19 DIAGNOSIS — K449 Diaphragmatic hernia without obstruction or gangrene: Secondary | ICD-10-CM | POA: Insufficient documentation

## 2022-01-19 NOTE — Telephone Encounter (Addendum)
SAVED a Prior Authorization RENEWAL request to [facility] for [place] via CoverMyMeds. Will need OV note to submit clinicals with PA renewal. Routing to Dr. Patsey Berthold  Key: [id number]  PAP renewal pending PA renewal. Application is in PAP pending info folder in pharmacy office for now.  Knox Saliva, PharmD, MPH, BCPS, CPP Clinical Pharmacist (Rheumatology and Pulmonology)

## 2022-01-20 ENCOUNTER — Telehealth: Payer: Self-pay | Admitting: Pulmonary Disease

## 2022-01-20 DIAGNOSIS — J4550 Severe persistent asthma, uncomplicated: Secondary | ICD-10-CM

## 2022-01-20 NOTE — Telephone Encounter (Signed)
PA submitted to [facility] for Nucala with attached clinicals  Key: [id number]  Knox Saliva, PharmD, MPH, BCPS, CPP Clinical Pharmacist (Rheumatology and Pulmonology)

## 2022-01-20 NOTE — Telephone Encounter (Signed)
Received notification from [facility] regarding a prior authorization for [place]. Authorization has been APPROVED from 01/20/2022 to 02/16/2023. Approval letter sent to scan center.  Authorization #  [id number]  Will need to submit PAP renewal which is in pharmacy office.  Knox Saliva, PharmD, MPH, BCPS, CPP Clinical Pharmacist (Rheumatology and Pulmonology)

## 2022-01-21 ENCOUNTER — Other Ambulatory Visit (HOSPITAL_COMMUNITY): Payer: Self-pay

## 2022-01-21 ENCOUNTER — Telehealth: Payer: Self-pay

## 2022-01-21 DIAGNOSIS — J4550 Severe persistent asthma, uncomplicated: Secondary | ICD-10-CM

## 2022-01-21 MED ORDER — NUCALA 100 MG/ML ~~LOC~~ SOAJ: SUBCUTANEOUS | 5 refills | Status: DC

## 2022-01-21 NOTE — Telephone Encounter (Deleted)
Submitted Patient Assistance Application to [facility] [place] for [place] along with provider portion, PA and income documents. Will update patient when we receive a response.  Fax# [phone] Phone# [phone]

## 2022-01-21 NOTE — Telephone Encounter (Signed)
Rx has been faxed with PAP renewal today. But nevertheless will refax rx.  Knox Saliva, PharmD, MPH, BCPS, CPP Clinical Pharmacist (Rheumatology and Pulmonology)

## 2022-01-21 NOTE — Telephone Encounter (Signed)
Creating a new encounter as previous encounter being signed and closed prematurely.  Submitted Patient Assistance Application to [facility] [place] for [place] along with provider portion, PA and income documents. Will update patient when we receive a response.   Fax# [phone] Phone# [phone]

## 2022-01-23 NOTE — Telephone Encounter (Signed)
Pt contacted clinic stating that she is still unable to fill her Rx after calling this morning. F/u with [facility] and the rep informs me that they have not received anything after the fax sent in on 11/9 that did not contain the prescriber's signature.   Informed the rep that I had confirmation receipts stating that the fax was successfully sent on 12/6. Upon further investigation, it turns out that the Rx's (both the one I submitted with the renewal application AND the separate one that [facility] sent in after the fact) had been automatically sorted into the renewal queue, which will not actually be touched until January 1st, 2024.  Rep stated that she would take the prescription from the renewal application and forward it to the pharmacy, however it first needs to be approved by "the higher ups". I inquired if there would be a faster way of doing this (such as faxing directly to the pharmacy) but the rep stated that this was the ONLY way.  Contacted pt and provided update, informed her that the rep assured me that she will reach out to pt once she has gotten approval to send the Rx. I requested that pt contact me here at the clinic again on Monday if she does not hear anything over the weekend. She verbalized understanding to all. Will await f/u if there is any.

## 2022-01-28 NOTE — Telephone Encounter (Signed)
Per pharmacy rep, they received Nucala prescription today. Conference called patient. Scheduled Nucala shipment for 02/03/2022 (patient is out of town on 01/30/2022).  Knox Saliva, PharmD, MPH, BCPS, CPP Clinical Pharmacist (Rheumatology and Pulmonology)

## 2022-03-10 MED ORDER — NUCALA 100 MG/ML ~~LOC~~ SOAJ: SUBCUTANEOUS | 5 refills | Status: DC

## 2022-03-10 NOTE — Addendum Note (Signed)
Addended by: Cassandria Anger on: 03/10/2022 04:32 PM   Modules accepted: Orders

## 2022-03-10 NOTE — Telephone Encounter (Signed)
Called [facility] [place] for 2024 PAP enrollment status. Per rep, patient is approved from 02/18/2022 through 02/16/2023. Rx has already been triaged. Rep will refax approval letter  Phone# [phone]  Knox Saliva, PharmD, MPH, BCPS, CPP Clinical Pharmacist (Rheumatology and Pulmonology)

## 2022-03-25 ENCOUNTER — Other Ambulatory Visit: Payer: Self-pay | Admitting: Pulmonary Disease

## 2022-03-27 ENCOUNTER — Encounter: Payer: Self-pay | Admitting: Pulmonary Disease

## 2022-05-04 ENCOUNTER — Telehealth: Payer: Self-pay | Admitting: Pulmonary Disease

## 2022-05-04 DIAGNOSIS — J4550 Severe persistent asthma, uncomplicated: Secondary | ICD-10-CM

## 2022-05-04 NOTE — Telephone Encounter (Signed)
Pt calling for nucala injection refills

## 2022-05-06 MED ORDER — NUCALA 100 MG/ML ~~LOC~~ SOAJ: SUBCUTANEOUS | 8 refills | Status: DC

## 2022-05-06 NOTE — Telephone Encounter (Signed)
Refill for Nucala sent to [facility] via fax today. Left VM for pt to advise that she can call company to schedule reifll if due.  Knox Saliva, PharmD, MPH, BCPS, CPP Clinical Pharmacist (Rheumatology and Pulmonology)

## 2022-07-30 ENCOUNTER — Other Ambulatory Visit: Payer: Self-pay | Admitting: Pulmonary Disease

## 2022-10-24 ENCOUNTER — Ambulatory Visit
Admission: EM | Admit: 2022-10-24 | Discharge: 2022-10-24 | Disposition: A | Discharge status: Home / Self Care | Payer: Medicare Other | Attending: Family Medicine | Admitting: Family Medicine

## 2022-10-24 DIAGNOSIS — M546 Pain in thoracic spine: Secondary | ICD-10-CM | POA: Diagnosis not present

## 2022-10-24 MED ORDER — TIZANIDINE HCL 4 MG PO TABS
4.0000 mg | ORAL_TABLET | Freq: Three times a day (TID) | ORAL | 0 refills | Status: AC | PRN
Start: 2022-10-24 — End: ?

## 2022-10-24 MED ORDER — NAPROXEN 500 MG PO TABS: 500.0000 mg | ORAL_TABLET | Freq: Two times a day (BID) | ORAL | 0 refills | Status: AC

## 2022-10-24 NOTE — ED Triage Notes (Signed)
Back pain that started yesterday morning when she got out of bed. Taking tylenol that isnt helping. She's alternating heat and ice.

## 2022-10-24 NOTE — Discharge Instructions (Signed)
If medication was prescribed, stop by the pharmacy to pick up your prescriptions.  For your  pain, Take 1500 mg Tylenol twice a day, take muscle relaxer (Tizanidine) twice a day, take Naprosyn twice a day,  as needed for pain.   Apply warm compresses intermittently, as needed.  As pain recedes, begin normal activities slowly as tolerated.  Follow up with primary care provider or an orthopedic provider, if symptoms persist.  Watch for worsening symptoms such as an increasing weakness or loss of sensation, increasing pain and/or the loss of bladder or bowel function. Should any of these occur, go to the emergency department immediately.

## 2022-10-24 NOTE — ED Provider Notes (Signed)
[facility]    CSN: [id number] Arrival date & time: 10/24/22  0809      History   Chief Complaint No chief complaint on file.   HPI  HPI Anita Kent is a 82 y.o. female.   Anita Kent presents for right mid back pain that started yesterday morning after waking up. Felt it as soon she walking to the kitchen. Pain is constant but eases up with Tylenol and been alternating heat and ice.  Pain described as tight and feels like it seizures up.  Has a "huge ache."   Has known back and hip issues.  She had some nausea but no vomiting.   No lifting, falls or known trauma. Denies weakness, numbness, tingling, dysuria, leg pain, leg weakness, new tingling, chest pain, incontinence, perianal numbness.   Continues to have  pain with movement. Putting weight on it makes it feel somewhat better.       Past Medical History:  Diagnosis Date   Asthma    COPD (chronic obstructive pulmonary disease) (HCC)    Cough variant asthma    Diabetes (HCC)    states was told she was not diabetic tested high once on hemoglobin A1C   Dyslipidemia    Emphysema of lung (HCC)    endometrial cancer 08/2016   Total Hysterectomy 09/04/2016   HTN (hypertension)    Hypertension    Hypothyroidism    Thyroid disease     Patient Active Problem List   Diagnosis Date Noted   Hiatal hernia 01/19/2022   Carotid stenosis 09/15/2018   CAD (coronary artery disease) 09/15/2018   Hyperlipidemia 09/15/2018   Essential hypertension 09/15/2018   Chest pain 08/25/2018   Endometrial cancer, FIGO stage IIIA (HCC) 09/16/2016   Sinusitis, acute 08/13/2014   Emphysema lung (HCC) 05/08/2014   Asthma, chronic 03/06/2014   Cough 03/06/2014    Past Surgical History:  Procedure Laterality Date   ABDOMINAL HYSTERECTOMY     CATARACT EXTRACTION, BILATERAL     CYSTOSCOPY N/A 09/04/2016   Procedure: CYSTOSCOPY;  Surgeon: Ward, Elenora Fender, MD;  Location: [facility];  Service: Gynecology;  Laterality: N/A;    DILATATION & CURETTAGE/HYSTEROSCOPY WITH MYOSURE N/A 09/04/2016   Procedure: DILATATION & CURETTAGE/HYSTEROSCOPY WITH MYOSURE;  Surgeon: Ward, Elenora Fender, MD;  Location: [facility];  Service: Gynecology;  Laterality: N/A;   GANGLION CYST EXCISION Left    hand   LAPAROSCOPIC BILATERAL SALPINGO OOPHERECTOMY Bilateral 09/04/2016   Procedure: LAPAROSCOPIC BILATERAL SALPINGO OOPHORECTOMY;  Surgeon: Ward, Elenora Fender, MD;  Location: [facility];  Service: Gynecology;  Laterality: Bilateral;   LAPAROSCOPIC HYSTERECTOMY  09/04/2016   Procedure: HYSTERECTOMY TOTAL LAPAROSCOPIC;  Surgeon: Ward, Elenora Fender, MD;  Location: [facility];  Service: Gynecology;;   primary open reduction procedure Right 11/09/2013    right ankle   TONSILLECTOMY     TUBAL LIGATION      OB History   No obstetric history on file.      Home Medications    Prior to Admission medications   Medication Sig Start Date End Date Taking? Authorizing Provider  albuterol (PROVENTIL) (2.5 MG/3ML) 0.083% nebulizer solution Take 3 mLs (2.5 mg total) by nebulization every 6 (six) hours as needed for wheezing or shortness of breath. 01/25/18   Merwyn Katos, MD  Albuterol Sulfate (PROAIR RESPICLICK) 108 (90 Base) MCG/ACT AEPB Inhale 2 puffs into the lungs every 6 (six) hours as needed. 04/18/20   Salena Saner, MD  atorvastatin (LIPITOR) 40 MG tablet Take 1 tablet (  40 mg total) by mouth daily at 6 PM. 08/26/18   Adrian Saran, MD  hydrochlorothiazide (HYDRODIURIL) 12.5 MG tablet Take 12.5 mg by mouth daily. 11/11/21   [provider]  ibuprofen (ADVIL,MOTRIN) 200 MG tablet Take 400 mg by mouth as needed for headache or moderate pain.     [provider]  levothyroxine (SYNTHROID, LEVOTHROID) 75 MCG tablet Take 75 mcg by mouth daily before breakfast.    [provider]  losartan (COZAAR) 50 MG tablet Take 50 mg by mouth daily.    [provider]  meloxicam (MOBIC) 15 MG tablet Take 15 mg by mouth daily.    [provider]  Mepolizumab (NUCALA) 100 MG/ML SOAJ INJECT 100MG  SUBCUTANEOUSLY EVERY 4 WEEKS 05/06/22   Salena Saner, MD  omeprazole (PRILOSEC OTC) 20 MG tablet Take 2 tablets (40 mg total) by mouth at bedtime. 11/01/17   Merwyn Katos, MD  predniSONE (DELTASONE) 20 MG tablet 40 mg once daily of prednisone for 2 days, 20 mg once daily for 5 days 10/15/21   Domenick Gong, MD  Probiotic CAPS Take 1 capsule by mouth daily.    [provider]  Spacer/Aero-Holding Chambers (AEROCHAMBER MV) inhaler Use as instructed 10/15/21   Domenick Gong, MD  Monte Fantasia INHUB 250-50 MCG/ACT AEPB INHALE 1 PUFF INTO THE LUNGS IN THE MORNING AND AT BEDTIME 07/30/22   Salena Saner, MD    Family History Family History  Problem Relation Age of Onset   Myasthenia gravis Daughter    Emphysema Mother    COPD Mother    Dementia Mother    Stroke Father    Heart attack Sister     Social History Social History   Tobacco Use   Smoking status: Never   Smokeless tobacco: Never  Vaping Use   Vaping status: Never Used  Substance Use Topics   Alcohol use: No    Alcohol/week: 0.0 standard drinks of alcohol   Drug use: No     Allergies   Oxycodone, Sulfa antibiotics, Amoxicillin, and Levaquin [levofloxacin in d5w]   Review of Systems Review of Systems: egative unless otherwise stated in HPI.      Physical Exam Triage Vital Signs ED Triage Vitals  Encounter Vitals Group     BP      Systolic BP Percentile      Diastolic BP Percentile      Pulse      Resp      Temp      Temp src      SpO2      Weight      Height      Head Circumference      Peak Flow      Pain Score      Pain Loc      Pain Education      Exclude from Growth Chart    No data found.  Updated Vital Signs There were no vitals taken for this visit.  Visual Acuity Right Eye Distance:   Left Eye Distance:   Bilateral Distance:    Right Eye Near:   Left Eye Near:    Bilateral Near:     Physical  Exam GEN: well appearing female in no acute distress  CVS: well perfused  RESP: speaking in full sentences without pause, no respiratory distress  MSK:  Lumbar spine: - Inspection: no gross deformity or asymmetry, swelling or ecchymosis. No skin changes  - Palpation: No ***TTP over the spinous  processes, ***bilateral lumbar paraspinal muscles, no SI joint tenderness bilaterally - ROM: full active ROM of the lumbar spine in flexion and extension but with mild pain ***with flexion/extension  - Strength: 5/5 strength of lower extremity in L4-S1 nerve root distributions b/l - Neuro: sensation intact in the L4-S1 nerve root distribution b/l, ***2+ L4 and S1 reflexes - Special testing: Negative straight leg raise SKIN: warm, dry, no overly skin rash or erythema    UC Treatments / Results  Labs (all labs ordered are listed, but only abnormal results are displayed) Labs Reviewed - No data to display  EKG   Radiology No results found.   Procedures Procedures (including critical care time)  Medications Ordered in UC Medications - No data to display  Initial Impression / Assessment and Plan / UC Course  I have reviewed the triage vital signs and the nursing notes.  Pertinent labs & imaging results that were available during my care of the patient were reviewed by me and considered in my medical decision making (see chart for details).      Pt is a 82 y.o.  female with *** days of *** back pain after ***.  Has history of ***low back pain.     On chart review had lumbar x-rays done at [facility] on 06/22/2022 that showed 2 views of the LS-spine ordered and interpreted on today's visit does show some mild scoliosis.  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   Does appear to have a little bit of spondylolisthesis of L4-L5 and L5-S1.   No acute bony abnormalities noted.  She appears to have  complete loss of the intravertebral disc space at the L5-S1 and L4-L5.    Obtained ***lumbar plain films.  Xray personally interpreted by me were ***unremarkable for fracture, ***or significant malalignment.  Radiologist report reviewed and notes ***  Patient to gradually return to normal activities, as tolerated and continue ordinary activities within the limits permitted by pain. Prescribed Naproxen sodium *** and muscle relaxer *** for pain relief.  Advised patient to avoid other NSAIDs while taking ***Naprosyn. Tylenol and Lidocaine patches PRN for multimodal pain relief. Counseled patient on red flag symptoms and when to seek immediate care.  ***No red flags suggesting cauda equina syndrome or progressive major motor weakness. Patient to follow up with orthopedic provider if symptoms do not improve with conservative treatment.  Return and ED precautions given.    Discussed MDM, treatment plan and plan for follow-up with patient who agrees with plan.   Final Clinical Impressions(s) / UC Diagnoses   Final diagnoses:  None   Discharge Instructions   None    ED Prescriptions   None    PDMP not reviewed this encounter.

## 2022-11-24 ENCOUNTER — Encounter: Payer: Self-pay | Admitting: Emergency Medicine

## 2022-11-24 ENCOUNTER — Ambulatory Visit
Admission: EM | Admit: 2022-11-24 | Discharge: 2022-11-24 | Disposition: A | Discharge status: Home / Self Care | Payer: Medicare Other | Attending: Physician Assistant | Admitting: Physician Assistant

## 2022-11-24 DIAGNOSIS — H00024 Hordeolum internum left upper eyelid: Secondary | ICD-10-CM

## 2022-11-24 MED ORDER — ERYTHROMYCIN 5 MG/GM OP OINT: TOPICAL_OINTMENT | OPHTHALMIC | 0 refills | Status: AC

## 2022-11-24 NOTE — ED Triage Notes (Signed)
Pt presents with a bump on her left eye lid since yesterday. Pt has applied warm compresses to the eye.

## 2022-11-24 NOTE — ED Provider Notes (Signed)
[facility]    CSN: [id number] Arrival date & time: 11/24/22  0806      History   Chief Complaint Chief Complaint  Patient presents with   Eye Problem    HPI Anita Kent is a 82 y.o. female presenting for painful lump of the left upper eyelid since yesterday.  She denies injury.  No drainage from the eye.  Reports a little of tearing.  Denies fever or significant swelling.  Has been applying warm compresses.  No other associated symptoms.  HPI  Past Medical History:  Diagnosis Date   Asthma    COPD (chronic obstructive pulmonary disease) (HCC)    Cough variant asthma    Diabetes (HCC)    states was told she was not diabetic tested high once on hemoglobin A1C   Dyslipidemia    Emphysema of lung (HCC)    endometrial cancer 08/2016   Total Hysterectomy 09/04/2016   HTN (hypertension)    Hypertension    Hypothyroidism    Thyroid disease     Patient Active Problem List   Diagnosis Date Noted   Hiatal hernia 01/19/2022   Carotid stenosis 09/15/2018   CAD (coronary artery disease) 09/15/2018   Hyperlipidemia 09/15/2018   Essential hypertension 09/15/2018   Chest pain 08/25/2018   Endometrial cancer, FIGO stage IIIA (HCC) 09/16/2016   Sinusitis, acute 08/13/2014   Emphysema lung (HCC) 05/08/2014   Asthma, chronic 03/06/2014   Cough 03/06/2014    Past Surgical History:  Procedure Laterality Date   ABDOMINAL HYSTERECTOMY     CATARACT EXTRACTION, BILATERAL     CYSTOSCOPY N/A 09/04/2016   Procedure: CYSTOSCOPY;  Surgeon: Ward, Elenora Fender, MD;  Location: [facility];  Service: Gynecology;  Laterality: N/A;   DILATATION & CURETTAGE/HYSTEROSCOPY WITH MYOSURE N/A 09/04/2016   Procedure: DILATATION & CURETTAGE/HYSTEROSCOPY WITH MYOSURE;  Surgeon: Ward, Elenora Fender, MD;  Location: [facility];  Service: Gynecology;  Laterality: N/A;   GANGLION CYST EXCISION Left    hand   LAPAROSCOPIC BILATERAL SALPINGO OOPHERECTOMY Bilateral 09/04/2016   Procedure: LAPAROSCOPIC  BILATERAL SALPINGO OOPHORECTOMY;  Surgeon: Ward, Elenora Fender, MD;  Location: [facility];  Service: Gynecology;  Laterality: Bilateral;   LAPAROSCOPIC HYSTERECTOMY  09/04/2016   Procedure: HYSTERECTOMY TOTAL LAPAROSCOPIC;  Surgeon: Ward, Elenora Fender, MD;  Location: [facility];  Service: Gynecology;;   primary open reduction procedure Right 11/09/2013    right ankle   TONSILLECTOMY     TUBAL LIGATION      OB History   No obstetric history on file.      Home Medications    Prior to Admission medications   Medication Sig Start Date End Date Taking? Authorizing Provider  erythromycin ophthalmic ointment Place a 1/2 inch ribbon of ointment into the upper eyelid q6h 11/24/22 12/01/22 Yes Eusebio Friendly B, PA-C  albuterol (PROVENTIL) (2.5 MG/3ML) 0.083% nebulizer solution Take 3 mLs (2.5 mg total) by nebulization every 6 (six) hours as needed for wheezing or shortness of breath. 01/25/18   Merwyn Katos, MD  Albuterol Sulfate (PROAIR RESPICLICK) 108 (90 Base) MCG/ACT AEPB Inhale 2 puffs into the lungs every 6 (six) hours as needed. 04/18/20   Salena Saner, MD  atorvastatin (LIPITOR) 40 MG tablet Take 1 tablet (40 mg total) by mouth daily at 6 PM. 08/26/18   Adrian Saran, MD  hydrochlorothiazide (HYDRODIURIL) 12.5 MG tablet Take 12.5 mg by mouth daily. 11/11/21   [provider]  ibuprofen (ADVIL,MOTRIN) 200 MG tablet Take 400 mg by mouth as  needed for headache or moderate pain.     [provider]  levothyroxine (SYNTHROID, LEVOTHROID) 75 MCG tablet Take 75 mcg by mouth daily before breakfast.    [provider]  losartan (COZAAR) 50 MG tablet Take 50 mg by mouth daily.    [provider]  Mepolizumab (NUCALA) 100 MG/ML SOAJ INJECT 100MG  SUBCUTANEOUSLY EVERY 4 WEEKS 05/06/22   Salena Saner, MD  naproxen (NAPROSYN) 500 MG tablet Take 1 tablet (500 mg total) by mouth 2 (two) times daily. 10/24/22   Katha Cabal, DO  omeprazole (PRILOSEC OTC) 20 MG tablet Take 2  tablets (40 mg total) by mouth at bedtime. 11/01/17   Merwyn Katos, MD  predniSONE (DELTASONE) 20 MG tablet 40 mg once daily of prednisone for 2 days, 20 mg once daily for 5 days 10/15/21   Domenick Gong, MD  Probiotic CAPS Take 1 capsule by mouth daily.    [provider]  Spacer/Aero-Holding Chambers (AEROCHAMBER MV) inhaler Use as instructed 10/15/21   Domenick Gong, MD  tiZANidine (ZANAFLEX) 4 MG tablet Take 1 tablet (4 mg total) by mouth every 8 (eight) hours as needed for muscle spasms. 10/24/22   Brimage, Seward Meth, DO  WIXELA INHUB 250-50 MCG/ACT AEPB INHALE 1 PUFF INTO THE LUNGS IN THE MORNING AND AT BEDTIME 07/30/22   Salena Saner, MD    Family History Family History  Problem Relation Age of Onset   Myasthenia gravis Daughter    Emphysema Mother    COPD Mother    Dementia Mother    Stroke Father    Heart attack Sister     Social History Social History   Tobacco Use   Smoking status: Never   Smokeless tobacco: Never  Vaping Use   Vaping status: Never Used  Substance Use Topics   Alcohol use: No    Alcohol/week: 0.0 standard drinks of alcohol   Drug use: No     Allergies   Nsaids, Diphenhydramine, Oxycodone, Sulfa antibiotics, Amoxicillin, and Levaquin [levofloxacin in d5w]   Review of Systems Review of Systems  Constitutional:  Negative for fatigue and fever.  HENT:  Negative for facial swelling.   Eyes:  Positive for pain. Negative for photophobia, discharge, redness, itching and visual disturbance.  Neurological:  Negative for headaches.     Physical Exam Triage Vital Signs ED Triage Vitals  Encounter Vitals Group     BP 11/24/22 0902 (!) 160/68     Systolic BP Percentile --      Diastolic BP Percentile --      Pulse Rate 11/24/22 0902 61     Resp 11/24/22 0902 16     Temp 11/24/22 0902 98.1 F (36.7 C)     Temp Source 11/24/22 0902 Oral     SpO2 11/24/22 0902 99 %     Weight --      Height --      Head Circumference --       Peak Flow --      Pain Score 11/24/22 0900 0     Pain Loc --      Pain Education --      Exclude from Growth Chart --    No data found.  Updated Vital Signs BP (!) 160/68 (BP Location: Right Arm)   Pulse 61   Temp 98.1 F (36.7 C) (Oral)   Resp 16   SpO2 99%     Physical Exam Vitals and nursing note reviewed.  Constitutional:  General: She is not in acute distress.    Appearance: Normal appearance. She is not ill-appearing or toxic-appearing.  HENT:     Head: Normocephalic and atraumatic.     Nose: Nose normal.     Mouth/Throat:     Mouth: Mucous membranes are moist.     Pharynx: Oropharynx is clear.  Eyes:     General: No scleral icterus.       Right eye: No discharge.        Left eye: Hordeolum (tiny external and larger internal upper) present.No discharge.     Conjunctiva/sclera: Conjunctivae normal.  Cardiovascular:     Rate and Rhythm: Normal rate.  Pulmonary:     Effort: Pulmonary effort is normal. No respiratory distress.  Musculoskeletal:     Cervical back: Neck supple.  Skin:    General: Skin is dry.  Neurological:     General: No focal deficit present.     Mental Status: She is alert. Mental status is at baseline.     Motor: No weakness.     Gait: Gait normal.  Psychiatric:        Mood and Affect: Mood normal.      UC Treatments / Results  Labs (all labs ordered are listed, but only abnormal results are displayed) Labs Reviewed - No data to display  EKG   Radiology No results found.  Procedures Procedures (including critical care time)  Medications Ordered in UC Medications - No data to display  Initial Impression / Assessment and Plan / UC Course  I have reviewed the triage vital signs and the nursing notes.  Pertinent labs & imaging results that were available during my care of the patient were reviewed by me and considered in my medical decision making (see chart for details).   82 year old female presents for stye of left  upper eyelid since yesterday.  She appears to have a tiny stye of the external eyelid and a moderate stye of the internal eyelid.  Slight crusting around her eyelashes.  No conjunctivitis.  Will treat at this time with erythromycin ophthalmic ointment and frequent use of warm compresses.  Reviewed return precautions.  Final Clinical Impressions(s) / UC Diagnoses   Final diagnoses:  Hordeolum internum of left upper eyelid     Discharge Instructions      -You have a stye. -Use the eye ointment. -Warm compresses every few hours over the next week -If increased pain/swelling return for re-evaluation    ED Prescriptions     Medication Sig Dispense Auth. Provider   erythromycin ophthalmic ointment Place a 1/2 inch ribbon of ointment into the upper eyelid q6h 3.5 g Shirlee Latch, PA-C      PDMP not reviewed this encounter.   Shirlee Latch, PA-C 11/24/22 [phone]

## 2022-11-24 NOTE — Discharge Instructions (Signed)
-  You have a stye. -Use the eye ointment. -Warm compresses every few hours over the next week -If increased pain/swelling return for re-evaluation

## 2022-12-01 ENCOUNTER — Other Ambulatory Visit: Payer: Self-pay | Admitting: Pulmonary Disease

## 2022-12-03 IMAGING — MR MR SHOULDER*L* W/O CM
6 series · 40 of 40 positions shown · non-contrast
Comparison: None Available.

CLINICAL DATA: Left shoulder pain for 4-5 weeks.

EXAM:
MRI OF THE LEFT SHOULDER WITHOUT CONTRAST
TECHNIQUE: Multiplanar, multisequence MR imaging of the shoulder was performed.
No intravenous contrast was administered.

[Series 3: T2 fat-sat · axial · 4.0mm · 0.55mm/px · z∈[-38,+52]mm · 7 of 20 slices shown (1 of 4)]
[im 1/20]
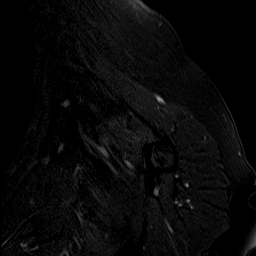
[im 4/20]
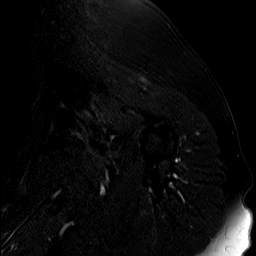
[im 7/20]
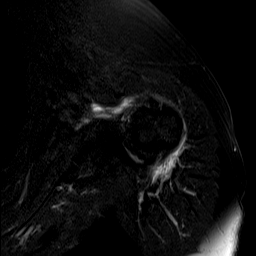
[im 10/20]
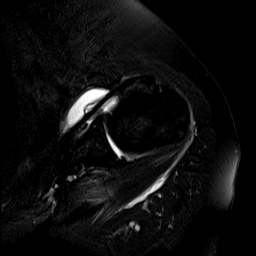
[im 13/20]
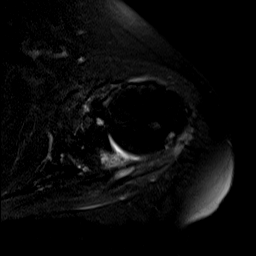
[im 16/20]
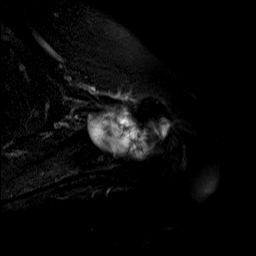
[im 20/20]
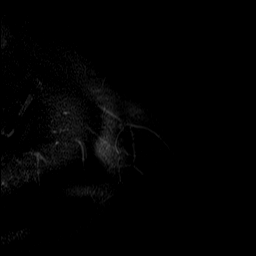

[Series 4: T2 fat-sat · oblique · 4.0mm · 0.55mm/px · 7 of 20 slices shown (2 of 4)]
[im 1/20]
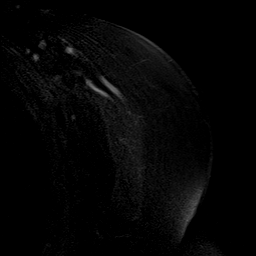
[im 4/20]
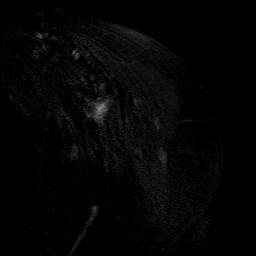
[im 7/20]
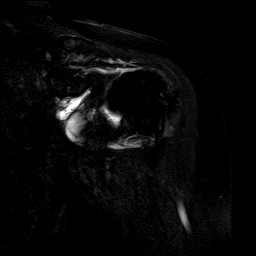
[im 10/20]
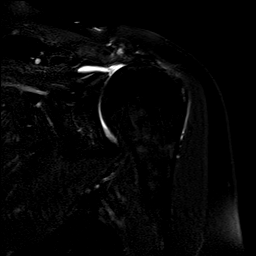
[im 13/20]
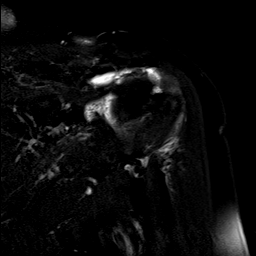
[im 16/20]
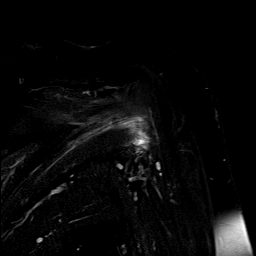
[im 20/20]
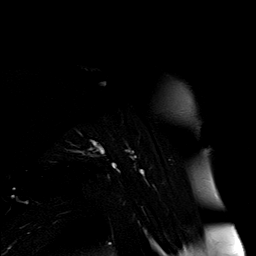

[Series 5: PD · oblique · 4.0mm · 0.55mm/px · 7 of 20 slices shown]
[im 1/20]
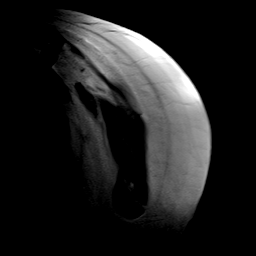
[im 4/20]
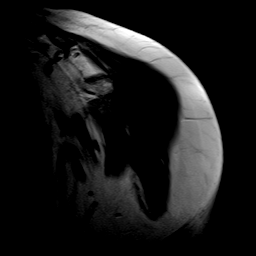
[im 7/20]
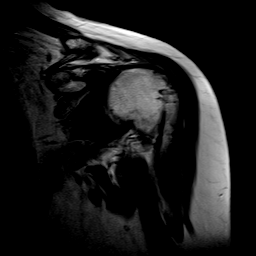
[im 10/20]
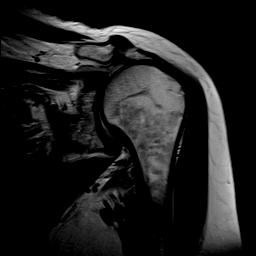
[im 13/20]
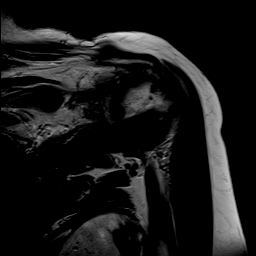
[im 16/20]
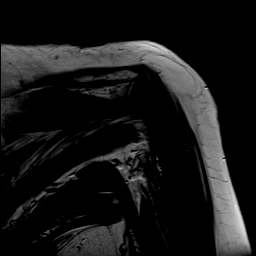
[im 20/20]
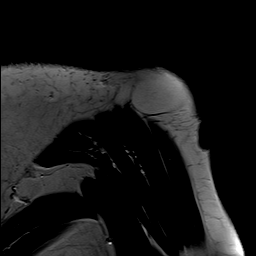

[Series 6: T2 fat-sat · oblique · 4.0mm · 0.55mm/px · 6 of 18 slices shown (3 of 4)]
[im 1/18]
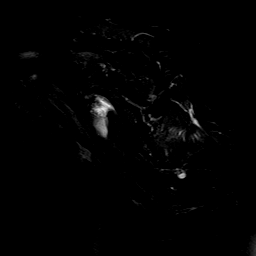
[im 4/18]
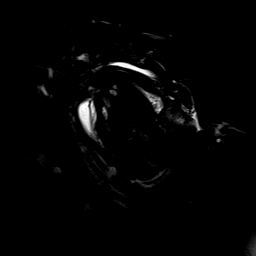
[im 7/18]
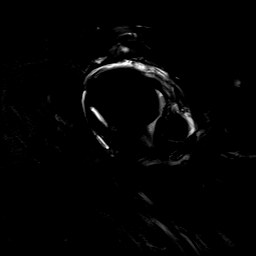
[im 11/18]
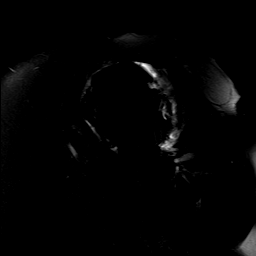
[im 14/18]
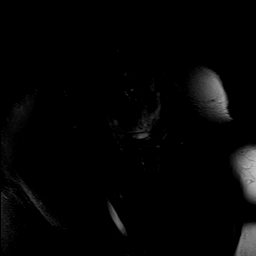
[im 18/18]
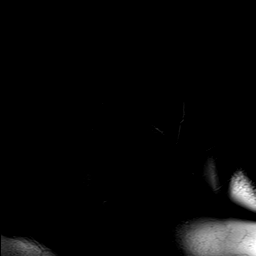

[Series 7: T1 · oblique · 4.0mm · 0.59mm/px · 6 of 18 slices shown]
[im 1/18]
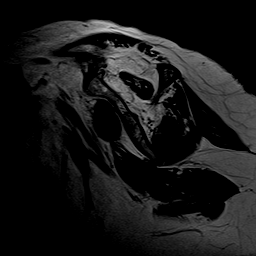
[im 4/18]
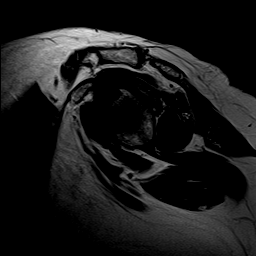
[im 7/18]
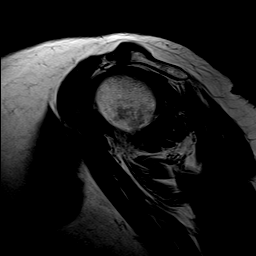
[im 11/18]
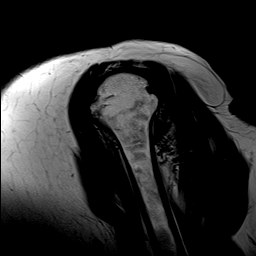
[im 14/18]
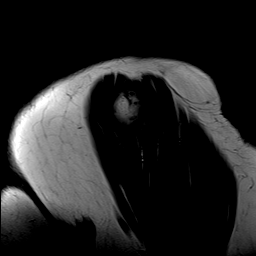
[im 18/18]
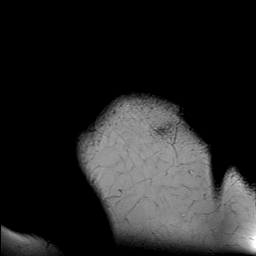

[Series 8: T2 fat-sat · axial · 4.0mm · 0.59mm/px · z∈[-39,+51]mm · 7 of 20 slices shown (4 of 4)]
[im 1/20]
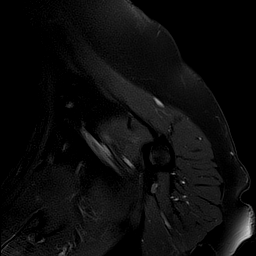
[im 4/20]
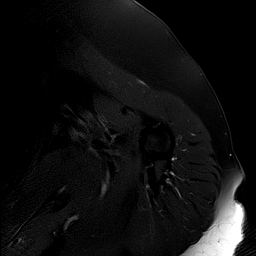
[im 7/20]
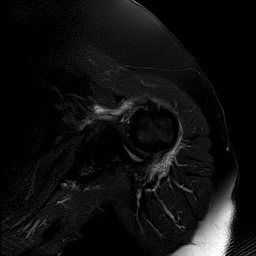
[im 10/20]
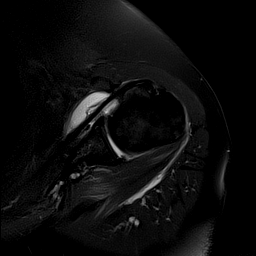
[im 13/20]
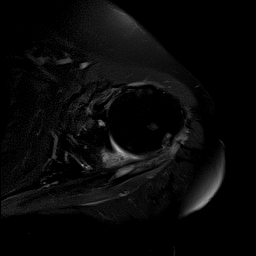
[im 16/20]
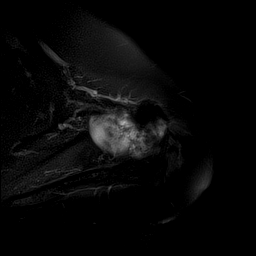
[im 20/20]
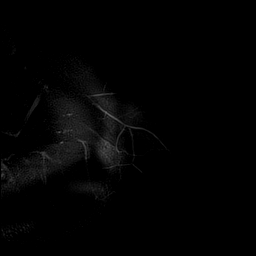

[40 of 40 positions shown; findings below may reference images not displayed]

FINDINGS: Rotator cuff: Large full-thickness retracted rotator cuff tear. The
supraspinatus and infraspinatus tendons are completely torn and
retracted. The supraspinatus tendon is retracted approximately 4 cm
and the infraspinatus tendon approximately 2.5 cm. The upper fibers
of the subscapularis tendon are also torn and retracted. The mid and
lower fibers are intact.

Muscles: Fatty atrophy of the supraspinatus and infraspinatus
muscles.

Biceps long head: Not identified. Likely chronically torn and
retracted.

Acromioclavicular Joint: Moderate degenerative changes. Type 2
acromion. No lateral downsloping or subacromial spurring. Marked
narrowing of the humeroacromial space due to the rotator cuff tear.

Glenohumeral Joint: Mild degenerative changes. The humeral head is
riding high in the glenoid fossa. Moderate-sized joint effusion.

Labrum: Degenerated and likely torn superior labrum. The anterior
and posterior labrum are intact.

Bones:  No acute bony findings.

Other: Expected fluid in the subacromial/subdeltoid bursa.
IMPRESSION: 1. Large full-thickness retracted rotator cuff tear as described
above. Fatty atrophy of the supraspinatus and infraspinatus muscle
suggesting a chronic tear.
2. Likely chronically torn and retracted long head biceps tendon.
3. Degenerated and torn superior labrum.
4. Moderate AC joint degenerative changes but no other significant
findings for bony impingement.

## 2022-12-29 ENCOUNTER — Other Ambulatory Visit: Payer: Self-pay | Admitting: Pulmonary Disease

## 2023-01-07 ENCOUNTER — Telehealth: Payer: Self-pay

## 2023-01-07 ENCOUNTER — Encounter: Payer: Self-pay | Admitting: Pulmonary Disease

## 2023-01-07 ENCOUNTER — Ambulatory Visit (INDEPENDENT_AMBULATORY_CARE_PROVIDER_SITE_OTHER): Payer: Medicare Other | Admitting: Pulmonary Disease

## 2023-01-07 VITALS — BP 148/74 | HR 65 | Temp 97.1°F | Ht 59.0 in | Wt 123.0 lb

## 2023-01-07 DIAGNOSIS — J4550 Severe persistent asthma, uncomplicated: Secondary | ICD-10-CM

## 2023-01-07 DIAGNOSIS — K449 Diaphragmatic hernia without obstruction or gangrene: Secondary | ICD-10-CM

## 2023-01-07 DIAGNOSIS — R0602 Shortness of breath: Secondary | ICD-10-CM

## 2023-01-07 DIAGNOSIS — J3089 Other allergic rhinitis: Secondary | ICD-10-CM

## 2023-01-07 LAB — NITRIC OXIDE: Nitric Oxide: 10

## 2023-01-07 NOTE — Telephone Encounter (Signed)
Patient was seen in the office today. She said she believes she needs a refill on her Nucala. Please advise.

## 2023-01-07 NOTE — Progress Notes (Signed)
Subjective:    Patient ID: Anita Kent, female    DOB: 07-20-1940, 82 y.o.   MRN: [id number]  Patient Care Team: Patrice Paradise, MD as PCP - General (Physician Assistant) Benita Gutter, RN as Registered Nurse Salena Saner, MD as Consulting Physician (Pulmonary Disease)  Chief Complaint  Patient presents with   Follow-up    Cough with clear sputum in the morning. DOE. No wheezing.     BACKGROUND/INTERVAL: 82 y.o. F, never smoker with history of asthma, previously patient of Dr. Dema Severin and Dr. Sung Amabile.  Started on Nucala in 2019 by Dr. Sung Amabile.   HPI Discussed the use of AI scribe software for clinical note transcription with the patient, who gave verbal consent to proceed.  History of Present Illness   The patient, currently on Nucala and Wixela for asthma management, reports no recent asthma flares. She has not been using her rescue inhaler or nebulizer, and the last two refills of ProAir expired unused. She has nebulizer solution on hand for emergencies.  The patient denies any recent fevers, chills, or sweats. She has declined flu vaccination due to a previous adverse reaction and has not received the RSV vaccine.  The patient also has a known hiatal hernia but reports no current issues with reflux, although she is on medication for it. She is also in the process of renewing her handicap sticker and ensuring her Nucala prescription is up to date for the coming year.  The patient's level of inflammation in the airway is well controlled (FeNO 10), consistent with the previous year's level.      DATA: 05/08/14 PFTs: FVC: 2.07 > 2.12 L (88 > 90 %pred), FEV1: 1.43 > 1.60 L (82 > 92 %pred), FEV1/FVC: 69%, TLC: 4.34 L (97 %pred), DLCO 67 %pred, DLCO/VA 81% predicted 09/24/16 CT chest: Peripheral wedge-shaped area of subsegmental atelectasis noted within the anterior left upper lobe. Scar versus subsegmental atelectasis noted within the posteromedial right lung base  and right middle lobe 11/18/17 PFTs: FVC: 1.56 > 1.61 L (69 > 71 %pred), FEV1: 1.03 > 1.00 L (59 > 57 %pred), FEV1/FVC: 66%, TLC: 4.90 L (109 %pred), DLCO measurement invalid 08/26/2018 2D echo: Mild DD, LVEF 50 to 55%.  No evidence of pulmonary hypertension. 05/15/2020 PFTs: FEV1 1.15 L or 74% predicted, FVC 1.65 L or 78% predicted, no bronchodilator response.  FEV1/FVC 70% lung volumes normal, no air trapping DLCO/VA 93% 10/15/2021 chest x-ray lateral: No acute cardiopulmonary disease, hyperinflation, hiatal hernia present    Review of Systems A 10 point review of systems was performed and it is as noted above otherwise negative.   Patient Active Problem List   Diagnosis Date Noted   Hiatal hernia 01/19/2022   Carotid stenosis 09/15/2018   CAD (coronary artery disease) 09/15/2018   Hyperlipidemia 09/15/2018   Essential hypertension 09/15/2018   Chest pain 08/25/2018   Endometrial cancer, FIGO stage IIIA (HCC) 09/16/2016   Sinusitis, acute 08/13/2014   Emphysema lung (HCC) 05/08/2014   Asthma, chronic 03/06/2014   Cough 03/06/2014    Social History   Tobacco Use   Smoking status: Never   Smokeless tobacco: Never  Substance Use Topics   Alcohol use: No    Alcohol/week: 0.0 standard drinks of alcohol    Allergies  Allergen Reactions   Nsaids Nausea And Vomiting    Trialed NSAIDs twice for MSK pain with nausea/vomiting both times.   Diphenhydramine Other (See Comments)    "Knocks me for a loop"  Oxycodone Itching   Sulfa Antibiotics Hives   Amoxicillin Hives and Rash    Has patient had a PCN reaction causing immediate rash, facial/tongue/throat swelling, SOB or lightheadedness with hypotension: Yes Has patient had a PCN reaction causing severe rash involving mucus membranes or skin necrosis: Yes Has patient had a PCN reaction that required hospitalization: No Has patient had a PCN reaction occurring within the last 10 years: Yes If all of the above answers are "NO",  then may proceed with Cephalosporin use.    Levaquin [Levofloxacin In D5w] Hives    Current Meds  Medication Sig   albuterol (PROVENTIL) (2.5 MG/3ML) 0.083% nebulizer solution Take 3 mLs (2.5 mg total) by nebulization every 6 (six) hours as needed for wheezing or shortness of breath.   Albuterol Sulfate (PROAIR RESPICLICK) 108 (90 Base) MCG/ACT AEPB Inhale 2 puffs into the lungs every 6 (six) hours as needed.   atorvastatin (LIPITOR) 40 MG tablet Take 1 tablet (40 mg total) by mouth daily at 6 PM.   fluticasone-salmeterol (WIXELA INHUB) 250-50 MCG/ACT AEPB INHALE 1 PUFF BY MOUTH TWICE DAILY   hydrochlorothiazide (HYDRODIURIL) 12.5 MG tablet Take 12.5 mg by mouth daily.   ibuprofen (ADVIL,MOTRIN) 200 MG tablet Take 400 mg by mouth as needed for headache or moderate pain.    levothyroxine (SYNTHROID, LEVOTHROID) 75 MCG tablet Take 75 mcg by mouth daily before breakfast.   losartan (COZAAR) 50 MG tablet Take 50 mg by mouth daily.   naproxen (NAPROSYN) 500 MG tablet Take 1 tablet (500 mg total) by mouth 2 (two) times daily.   omeprazole (PRILOSEC OTC) 20 MG tablet Take 2 tablets (40 mg total) by mouth at bedtime.   Probiotic CAPS Take 1 capsule by mouth daily.   Spacer/Aero-Holding Chambers (AEROCHAMBER MV) inhaler Use as instructed   tiZANidine (ZANAFLEX) 4 MG tablet Take 1 tablet (4 mg total) by mouth every 8 (eight) hours as needed for muscle spasms.   [DISCONTINUED] Mepolizumab (NUCALA) 100 MG/ML SOAJ INJECT 100MG  SUBCUTANEOUSLY EVERY 4 WEEKS    Immunization History  Administered Date(s) Administered   Influenza Split 01/04/2014   Influenza-Unspecified 12/25/2013   PFIZER Comirnaty(Gray Top)Covid-19 Tri-Sucrose Vaccine 03/24/2019, 04/14/2019, 01/03/2020   PFIZER(Purple Top)SARS-COV-2 Vaccination 03/24/2019, 04/14/2019, 01/03/2020   Pneumococcal Polysaccharide-23 01/04/2014   Pneumococcal-Unspecified 12/25/2013   Tdap 06/15/2017        Objective:     BP (!) 148/74 (BP Location:  Right Arm, Cuff Size: Normal)   Pulse 65   Temp (!) 97.1 F (36.2 C)   Ht 4\' 11"  (1.499 m)   Wt 123 lb (55.8 kg)   SpO2 99%   BMI 24.84 kg/m   SpO2: 99 % O2 Device: None (Room air)  GENERAL: Well-developed, overweight elderly woman, quite spry.  Age-appropriate.  No acute distress.  Ambulatory with assistance of a cane. HEAD: Normocephalic, atraumatic.  EYES: Pupils equal, round, reactive to light.  No scleral icterus.  MOUTH: Nose/mouth/throat not examined due to masking requirements for COVID 19. NECK: Supple. No thyromegaly. Trachea midline. No JVD.  No adenopathy. PULMONARY: Good air entry bilaterally.  Mild wheezing in the upper lung zones, no other adventitious sounds. CARDIOVASCULAR: S1 and S2. Regular rate and rhythm.  No rubs, murmurs or gallops heard. ABDOMEN: Benign. 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  NEUROLOGIC: No overt focal deficit, gait assisted with cane, fluent speech. SKIN: Intact,warm,dry.  On limited exam, no rashes. PSYCH: Mood and behavior is normal.  Lab Results  Component Value Date  NITRICOXIDE 10 01/07/2023    Assessment & Plan:     ICD-10-CM   1. Severe persistent asthma without complication  J45.50 Nitric oxide    2. Perennial allergic rhinitis  J30.89     3. Hiatal hernia  K44.9       Orders Placed This Encounter  Procedures   Nitric oxide    Discussion:    Asthma Asthma is well-controlled with no recent flares. Currently on [place]. Minimal use of rescue inhaler or nebulizer. Airway inflammation levels are stable compared to last year. Emphasized the importance of maintaining current medication regimen and having a rescue inhaler available. - Refill Nucala for next year - Notify pharmacy team for [employer] - Checked nitric oxide today - Ensure availability of rescue inhaler  Hiatal Hernia Good-sized hiatal hernia with no issues with reflux. Currently on medication.  General Health  Maintenance Does not take the flu vaccine due to a previous adverse reaction and has not taken the RSV vaccine. - Discuss flu vaccine alternatives if necessary - Consider RSV vaccine if indicated  Follow-up - Schedule follow-up appointment in six months to a year.       Gailen Shelter, MD Advanced Bronchoscopy PCCM [facility]    *This note was generated using voice recognition software/Dragon and/or AI transcription program.  Despite best efforts to proofread, errors can occur which can change the meaning. Any transcriptional errors that result from this process are unintentional and may not be fully corrected at the time of dictation.

## 2023-01-07 NOTE — Patient Instructions (Signed)
VISIT SUMMARY:  During today's visit, we reviewed your asthma management and hiatal hernia. Your asthma is well-controlled with your current medications, and you have not needed your rescue inhaler or nebulizer recently. We also discussed your hiatal hernia, which is not causing any current issues. Additionally, we talked about your flu and RSV vaccination status.  YOUR PLAN:  -ASTHMA: Asthma is a condition where your airways narrow and swell, making it difficult to breathe. Your asthma is well-controlled with Nucala and Wixela, and you have not had any recent flares. Please continue your current medication regimen, ensure you have a rescue inhaler available, and we will refill your Nucala prescription for the next year.  -HIATAL HERNIA: A hiatal hernia occurs when part of your stomach pushes up through your diaphragm. You are currently not experiencing any reflux issues and are on medication to manage this condition.  -GENERAL HEALTH MAINTENANCE: You have chosen not to take the flu vaccine due to a previous adverse reaction and have not received the RSV vaccine. We can discuss alternatives to the flu vaccine if necessary and consider the RSV vaccine if indicated.  INSTRUCTIONS:  Please schedule a follow-up appointment in six months to a year.

## 2023-01-11 MED ORDER — NUCALA 100 MG/ML ~~LOC~~ SOAJ
SUBCUTANEOUS | 5 refills | Status: DC
Start: 2023-01-11 — End: 2023-09-20

## 2023-01-11 NOTE — Telephone Encounter (Signed)
Refill for Nucala faxed to [facility]  Chesley Mires, PharmD, MPH, BCPS, CPP Clinical Pharmacist (Rheumatology and Pulmonology)

## 2023-01-28 ENCOUNTER — Telehealth: Payer: Self-pay | Admitting: Pulmonary Disease

## 2023-01-28 NOTE — Telephone Encounter (Signed)
Patient havingtrouble getting her Mepolizumab (NUCALA) 100 MG/ML SOAJ

## 2023-02-01 NOTE — Telephone Encounter (Signed)
Patient states she called [facility] and was told that rx was received with 13 refills. I spoke with [facility] - they were able to locate rx that was faxed at the end of November and hve forwarded that to pharmacy. They said patient can call back in 24 hours to schedule shipment.  Rturned call to patient to notify of this  Chesley Mires, PharmD, MPH, BCPS, CPP Clinical Pharmacist (Rheumatology and Pulmonology)

## 2023-03-02 ENCOUNTER — Telehealth: Payer: Self-pay

## 2023-03-02 NOTE — Telephone Encounter (Signed)
 Received a fax from  [employer] regarding an approval for NUCALA patient assistance until 02/16/24. Approval letter sent to scan center.  Phone #: [phone] Fax #: [phone]

## 2023-04-05 ENCOUNTER — Encounter: Payer: Self-pay | Admitting: Pulmonary Disease

## 2023-09-20 ENCOUNTER — Telehealth: Payer: Self-pay | Admitting: Pulmonary Disease

## 2023-09-20 DIAGNOSIS — J4550 Severe persistent asthma, uncomplicated: Secondary | ICD-10-CM

## 2023-09-20 MED ORDER — NUCALA 100 MG/ML ~~LOC~~ SOAJ: SUBCUTANEOUS | 5 refills | Status: DC

## 2023-09-20 MED ORDER — NUCALA 100 MG/ML ~~LOC~~ SOAJ: SUBCUTANEOUS | 3 refills | Status: DC

## 2023-09-20 NOTE — Telephone Encounter (Signed)
 Rx for Nucala  sent to incorrect pharmacy. Rx has to be faxed to [facility]  Sherry Pennant, PharmD, MPH, BCPS, CPP Clinical Pharmacist (Rheumatology and Pulmonology)

## 2023-09-20 NOTE — Addendum Note (Signed)
 Addended by: DAYNE SHERRY RAMAN on: 09/20/2023 04:35 PM   Modules accepted: Orders

## 2023-09-20 NOTE — Telephone Encounter (Signed)
 Copied from CRM [id number]. Topic: Clinical - Medication Refill >> Sep 20, 2023  9:53 AM Benton O wrote: Medication: Mepolizumab  (NUCALA ) 100 MG/ML SOAJ  Has the patient contacted their pharmacy? Yes no refills  (Agent: If no, request that the patient contact the pharmacy for the refill. If patient does not wish to contact the pharmacy document the reason why and proceed with request.) (Agent: If yes, when and what did the pharmacy advise?)  This is the patient's preferred pharmacy:  [facility] - [place], [place] - [address] [place] [address] Phone: [phone] Fax: [phone]  [facility] - [place], [place] - [address] [place] [address] Phone: [phone] Fax: [phone]  [facility] [place]  - [place], [place] - [address] [place] [address] Phone: [phone] Fax: [phone]  [facility] - [place], [place] - [address] [address] Phone: [phone] Fax: [phone]  [facility] - [place], [place] - [address] [place] [address] Phone: [phone] Fax: [phone]  [facility] - [place]  - [place], [place] - [address] [place] [address] Phone: [phone] Fax: [phone]  Is this the correct pharmacy for this prescription? Yes If no, delete pharmacy and type the correct one.  [facility] - [place], [place] - [address] [place]  [address] Phone: [phone] Fax: [phone]   Has the prescription been filled recently? No  Is the patient out of the medication? Yes  Has the patient been seen for an appointment in the last year OR does the patient have an upcoming appointment?  Yes  Can we respond through MyChart? No would like a text or a call when meds have been sent to pharmacy mail order   Agent: Please be advised that Rx refills may take up to 3 business days. We ask that you follow-up with your pharmacy.

## 2023-10-05 ENCOUNTER — Ambulatory Visit: Admitting: Pulmonary Disease

## 2023-11-09 ENCOUNTER — Ambulatory Visit (INDEPENDENT_AMBULATORY_CARE_PROVIDER_SITE_OTHER): Admitting: Pulmonary Disease

## 2023-11-09 ENCOUNTER — Encounter: Payer: Self-pay | Admitting: Pulmonary Disease

## 2023-11-09 VITALS — BP 126/60 | HR 75 | Temp 97.7°F | Ht 59.0 in | Wt 125.8 lb

## 2023-11-09 DIAGNOSIS — J8283 Eosinophilic asthma: Secondary | ICD-10-CM

## 2023-11-09 DIAGNOSIS — J3089 Other allergic rhinitis: Secondary | ICD-10-CM | POA: Diagnosis not present

## 2023-11-09 LAB — NITRIC OXIDE: Nitric Oxide: 14

## 2023-11-09 NOTE — Progress Notes (Signed)
 Subjective:    Patient ID: Anita Kent, female    DOB: 1940-10-03, 83 y.o.   MRN: [id number]  Patient Care Team: Marikay Eva POUR, PA as PCP - General (Physician Assistant) Tamea Dedra CROME, MD as Consulting Physician (Pulmonary Disease)  Chief Complaint  Patient presents with   Asthma    Post nasal drip due to sinus. Cough with clear/yellow mucus.     BACKGROUND/INTERVAL:82 y.o. F, never smoker, with history of asthma, previously patient of Dr. Theta and Dr. Linard.  Started on Nucala  in 2019 by Dr. Linard.  First visit with me on 30 December 2018, last seen on 07 January 2023.  HPI Discussed the use of AI scribe software for clinical note transcription with the patient, who gave verbal consent to proceed.  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  She describes the Nucala  injections as her 'lifesaver' and confirms regular use of Wixela, noting that she usually receives a three-month supply and recently refilled it a month ago.  She does not use albuterol  and has never needed it, although she still possesses a nebulizer. She has not received a flu vaccine yet.  Since her last visit in November she has not had any major exacerbations.     DATA: 05/08/14 PFTs: FVC: 2.07 > 2.12 L (88 > 90 %pred), FEV1: 1.43 > 1.60 L (82 > 92 %pred), FEV1/FVC: 69%, TLC: 4.34 L (97 %pred), DLCO 67 %pred, DLCO/VA 81% predicted 09/24/16 CT chest: Peripheral wedge-shaped area of subsegmental atelectasis noted within the anterior left upper lobe. Scar versus subsegmental atelectasis noted within the posteromedial right lung base and right middle lobe 11/18/17 PFTs: FVC: 1.56 > 1.61 L (69 > 71 %pred), FEV1: 1.03 > 1.00 L (59 > 57 %pred), FEV1/FVC: 66%, TLC: 4.90 L (109 %pred), DLCO measurement invalid 08/26/2018 2D echo: Mild DD, LVEF 50 to 55%.  No evidence of pulmonary hypertension. 05/15/2020  PFTs: FEV1 1.15 L or 74% predicted, FVC 1.65 L or 78% predicted, no bronchodilator response.  FEV1/FVC 70% lung volumes normal, no air trapping DLCO/VA 93% 10/15/2021 chest x-ray lateral: No acute cardiopulmonary disease, hyperinflation, hiatal hernia present   Review of Systems A 10 point review of systems was performed and it is as noted above otherwise negative.   Patient Active Problem List   Diagnosis Date Noted   Hiatal hernia 01/19/2022   Carotid stenosis 09/15/2018   CAD (coronary artery disease) 09/15/2018   Hyperlipidemia 09/15/2018   Essential hypertension 09/15/2018   Chest pain 08/25/2018   Endometrial cancer, FIGO stage IIIA (HCC) 09/16/2016   Sinusitis, acute 08/13/2014   Emphysema lung (HCC) 05/08/2014   Asthma, chronic 03/06/2014   Cough 03/06/2014    Social History   Tobacco Use   Smoking status: Never   Smokeless tobacco: Never  Substance Use Topics   Alcohol use: No    Alcohol/week: 0.0 standard drinks of alcohol    Allergies  Allergen Reactions   Nsaids Nausea And Vomiting    Trialed NSAIDs twice for MSK pain with nausea/vomiting both times.   Diphenhydramine Other (See Comments)    Knocks me for a loop   Oxycodone  Itching   Sulfa Antibiotics Hives   Amoxicillin  Hives and Rash    Has patient had a PCN reaction causing immediate rash, facial/tongue/throat swelling, SOB or lightheadedness with hypotension: Yes Has patient had a PCN reaction causing severe rash involving mucus  membranes or skin necrosis: Yes Has patient had a PCN reaction that required hospitalization: No Has patient had a PCN reaction occurring within the last 10 years: Yes If all of the above answers are NO, then may proceed with Cephalosporin use.    Levaquin  [Levofloxacin  In D5w] Hives    Current Meds  Medication Sig   albuterol  (PROVENTIL ) (2.5 MG/3ML) 0.083% nebulizer solution Take 3 mLs (2.5 mg total) by nebulization every 6 (six) hours as needed for wheezing or  shortness of breath.   Albuterol  Sulfate (PROAIR  RESPICLICK) 108 (90 Base) MCG/ACT AEPB Inhale 2 puffs into the lungs every 6 (six) hours as needed.   atorvastatin  (LIPITOR) 40 MG tablet Take 1 tablet (40 mg total) by mouth daily at 6 PM.   fluticasone -salmeterol (WIXELA INHUB ) 250-50 MCG/ACT AEPB INHALE 1 PUFF BY MOUTH TWICE DAILY   hydrochlorothiazide (HYDRODIURIL) 12.5 MG tablet Take 12.5 mg by mouth daily.   ibuprofen  (ADVIL ,MOTRIN ) 200 MG tablet Take 400 mg by mouth as needed for headache or moderate pain.    levothyroxine  (SYNTHROID , LEVOTHROID) 75 MCG tablet Take 75 mcg by mouth daily before breakfast.   losartan  (COZAAR ) 50 MG tablet Take 50 mg by mouth daily.   Mepolizumab  (NUCALA ) 100 MG/ML SOAJ INJECT 100MG  SUBCUTANEOUSLY EVERY 4 WEEKS   naproxen  (NAPROSYN ) 500 MG tablet Take 1 tablet (500 mg total) by mouth 2 (two) times daily.   omeprazole  (PRILOSEC  OTC) 20 MG tablet Take 2 tablets (40 mg total) by mouth at bedtime.   Probiotic CAPS Take 1 capsule by mouth daily.   Spacer/Aero-Holding Chambers (AEROCHAMBER MV) inhaler Use as instructed   tiZANidine  (ZANAFLEX ) 4 MG tablet Take 1 tablet (4 mg total) by mouth every 8 (eight) hours as needed for muscle spasms.    Immunization History  Administered Date(s) Administered   Influenza Split 01/04/2014   Influenza-Unspecified 12/25/2013   PFIZER Comirnaty(Gray Top)Covid-19 Tri-Sucrose Vaccine 03/24/2019, 04/14/2019, 01/03/2020   PFIZER(Purple Top)SARS-COV-2 Vaccination 03/24/2019, 04/14/2019, 01/03/2020   Pneumococcal Polysaccharide-23 01/04/2014   Pneumococcal-Unspecified 12/25/2013   Tdap 06/15/2017        Objective:     BP 126/60   Pulse 75   Temp 97.7 F (36.5 C) (Temporal)   Ht 4' 11 (1.499 m)   Wt 125 lb 12.8 oz (57.1 kg)   SpO2 96%   BMI 25.41 kg/m   SpO2: 96 %  GENERAL: Well-developed, overweight elderly woman, quite spry.  Age-appropriate.  No acute distress.  Ambulatory with assistance of a cane. HEAD:  Normocephalic, atraumatic.  EYES: Pupils equal, round, reactive to light.  No scleral icterus.  MOUTH: Dentition intact, oral mucosa moist.  No thrush. NECK: Supple. No thyromegaly. Trachea midline. No JVD.  No adenopathy. PULMONARY: Good air entry bilaterally.  No adventitious sounds. CARDIOVASCULAR: S1 and S2. Regular rate and rhythm.  No rubs, murmurs or gallops heard. ABDOMEN: Benign. 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  NEUROLOGIC: No overt focal deficit, gait assisted with cane, fluent speech. SKIN: Intact,warm,dry.  On limited exam, no rashes. PSYCH: Mood and behavior is normal.  Lab Results  Component Value Date   NITRICOXIDE 14 11/09/2023  *This result suggests low (<25) Type II (T2) airway inflammation indicating a low likelihood of active T2-driven airway inflammation.  In a patient with active T2 driven asthma management it suggests good control.       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 Perennial allergic  rhinitis  J30.89       Orders Placed This Encounter  Procedures   Nitric oxide    Discussion:    Eosinophilic asthma Eosinophilic asthma is being treated with Nucala  and Wixela. She reports significant improvement with Nucala  and has not required albuterol . - Send prescription for [facility] to [facility] [place]. - Check airway inflammation test, nitric oxide  14 ppb.  Indicating good control. - Consider reducing Wixela dose to 100 mcg if airway inflammation remains stable on follow-up.      Follow-up in 6 months time call sooner should any new problems arise.  Advised if symptoms do not improve or worsen, to please contact office for sooner follow up or seek emergency care.    I spent 35 minutes of dedicated to the care of this patient on the date of this encounter to include pre-visit review of records, face-to-face time with the patient discussing conditions above, post visit ordering  of testing, clinical documentation with the electronic health record, making appropriate referrals as documented, and communicating necessary findings to members of the patients care team.     C. Leita Sanders, MD Advanced Bronchoscopy PCCM [facility]    *This note was generated using voice recognition software/Dragon and/or AI transcription program.  Despite best efforts to proofread, errors can occur which can change the meaning. Any transcriptional errors that result from this process are unintentional and may not be fully corrected at the time of dictation.

## 2023-11-09 NOTE — Patient Instructions (Signed)
 VISIT SUMMARY:  Today, you came in for a follow-up visit to discuss your eosinophilic asthma. You mentioned that the Nucala  injections have been very helpful and that you are regularly using Wixela. You also noted that you have not needed to use albuterol  and have not received a flu shot.  YOUR PLAN:  -EOSINOPHILIC ASTHMA: Eosinophilic asthma is a type of asthma characterized by high levels of eosinophils, a type of white blood cell, which can cause inflammation and airway obstruction. You are currently being treated with Nucala  and Wixela, and you have reported significant improvement. We will send a prescription for [facility] to [facility] in [place]. Additionally, we will check your airway inflammation levels. If the test results are favorable, we may consider reducing your Wixela dose to 100 mcg to minimize any potential impact on your bones.  INSTRUCTIONS:  Please follow up with the airway inflammation test as discussed. If you have any questions or concerns, feel free to contact our office.

## 2024-01-07 ENCOUNTER — Telehealth: Payer: Self-pay

## 2024-01-07 DIAGNOSIS — J8283 Eosinophilic asthma: Secondary | ICD-10-CM

## 2024-01-07 NOTE — Telephone Encounter (Signed)
 Patient enrolled into asthma grant through [employer]: Amount: $2000 Award Period: 07/11/2023 - 01/06/2025 ID: [id number] BIN: [id number] PCN: [id number] Group: [id number] For pharmacy inquiries, contact PD[employer] at [phone]. For patient inquiries, contact [employer] at [phone].  MyChart message sent ot patient. Patient can fill with [facility] after 02/17/2024. She should continue filling Nucala  through [employer] through end of 2025 calendar year

## 2024-01-19 ENCOUNTER — Telehealth: Payer: Self-pay | Admitting: Pulmonary Disease

## 2024-01-19 MED ORDER — FLUTICASONE-SALMETEROL 100-50 MCG/ACT IN AEPB: 1.0000 | INHALATION_SPRAY | Freq: Two times a day (BID) | RESPIRATORY_TRACT | 3 refills | Status: AC

## 2024-01-19 NOTE — Telephone Encounter (Signed)
 Can send Wixela refill to pharmacy with 90-day supply.  May reduce strength to 100/50, 1 inhalation twice a day.

## 2024-01-19 NOTE — Telephone Encounter (Signed)
 Copied from CRM #[id number]. Topic: Clinical - Medication Refill >> Jan 19, 2024  9:55 AM Whitney O wrote: Medication: fluticasone -salmeterol (WIXELA INHUB ) 250-50 MCG/ACT AEPB  Has the patient contacted their pharmacy? No patient was told by doctor to contact office because dosage is to be reduced (Agent: If no, request that the patient contact the pharmacy for the refill. If patient does not wish to contact the pharmacy document the reason why and proceed with request.) (Agent: If yes, when and what did the pharmacy advise?)  This is the patient's preferred pharmacy:  [id number] [place], [place] - [address] AT [facility] [address] [place] [address] [place] [address] Phone: [phone] Fax: [phone]    Is this the correct pharmacy for this prescription? Yes If no, delete pharmacy and type the correct one.   Has the prescription been filled recently? No  Is the patient out of the medication? No got enough to last for aanother week or so   Has the patient been seen for an appointment in the last year OR does the patient have an upcoming appointment? Yes  Can we respond through MyChart? Yes  Agent: Please be advised that Rx refills may take up to 3 business days. We ask that you follow-up with your pharmacy.

## 2024-01-19 NOTE — Telephone Encounter (Signed)
I have sent in the script and notified the patient.  Nothing further needed.

## 2024-02-18 ENCOUNTER — Telehealth: Payer: Self-pay

## 2024-02-18 NOTE — Telephone Encounter (Signed)
 Please see 01/07/24 telephone thread.

## 2024-02-18 NOTE — Telephone Encounter (Signed)
 Copied from CRM #[id number]. Topic: Clinical - Prescription Issue >> Feb 16, 2024  2:04 PM Benton O wrote: Reason for CRM: patient is calling because the injection she takes its time to renew and they say that dr tamea has to do it online and they have a new fax number .

## 2024-02-23 ENCOUNTER — Ambulatory Visit: Attending: Pulmonary Disease

## 2024-02-23 DIAGNOSIS — J8283 Eosinophilic asthma: Secondary | ICD-10-CM

## 2024-02-23 DIAGNOSIS — J4550 Severe persistent asthma, uncomplicated: Secondary | ICD-10-CM

## 2024-02-23 MED ORDER — NUCALA 100 MG/ML ~~LOC~~ SOAJ
SUBCUTANEOUS | 5 refills | Status: AC
  Filled 2024-03-07: qty 1, 28d supply, fill #0

## 2024-02-23 NOTE — Telephone Encounter (Signed)
 See pharmacotherapy visit note 02/23/24. She is receiving her last shipment of Nucala  today for her dose due today, then will need supply from [facility] for next dose due 03/22/24. Rx triaged to [facility] - see pharmacotherapy visit note.

## 2024-02-23 NOTE — Progress Notes (Unsigned)
 [facility] - [employer]  Referring Provider: Dr. Tamea  Virtual Visit via Telephone Note  I connected with Anita Kent on 02/23/2024 at 12:30 PM EST by telephone and verified that I am speaking with the correct person using two identifiers.  Location: Patient: home Provider: office   I discussed the limitations, risks, security and privacy concerns of performing an evaluation and management service by telephone and the availability of in person appointments. I also discussed with the patient that there may be a patient responsible charge related to this service. The patient expressed understanding and agreed to proceed.  HPI: Anita Kent is a 84 y.o. female who presents to the pharmacotherapy clinic via telephone for follow-up Nucala  counseling. She received Nucala  through patient assistance in 2025, but will be transitioning to using [facility] in 2026 as she is covered by a grant. Contact with patient today to discuss process for getting Nucala  in 2026.    Patient Active Problem List   Diagnosis Date Noted   Hiatal hernia 01/19/2022   Carotid stenosis 09/15/2018   CAD (coronary artery disease) 09/15/2018   Hyperlipidemia 09/15/2018   Essential hypertension 09/15/2018   Chest pain 08/25/2018   Endometrial cancer, FIGO stage IIIA (HCC) 09/16/2016   Sinusitis, acute 08/13/2014   Emphysema lung (HCC) 05/08/2014   Asthma, chronic 03/06/2014   Cough 03/06/2014    Patient's Medications  New Prescriptions   No medications on file  Previous Medications   ALBUTEROL  (PROVENTIL ) (2.5 MG/3ML) 0.083% NEBULIZER SOLUTION    Take 3 mLs (2.5 mg total) by nebulization every 6 (six) hours as needed for wheezing or shortness of breath.   ALBUTEROL  SULFATE (PROAIR  RESPICLICK) 108 (90 BASE) MCG/ACT AEPB    Inhale 2 puffs into the lungs every 6 (six) hours as needed.   ATORVASTATIN  (LIPITOR) 40 MG TABLET    Take 1 tablet (40 mg total) by  mouth daily at 6 PM.   FLUTICASONE -SALMETEROL (WIXELA INHUB) 100-50 MCG/ACT AEPB    Inhale 1 puff into the lungs 2 (two) times daily.   HYDROCHLOROTHIAZIDE (HYDRODIURIL) 12.5 MG TABLET    Take 12.5 mg by mouth daily.   IBUPROFEN  (ADVIL ,MOTRIN ) 200 MG TABLET    Take 400 mg by mouth as needed for headache or moderate pain.    LEVOTHYROXINE  (SYNTHROID , LEVOTHROID) 75 MCG TABLET    Take 75 mcg by mouth daily before breakfast.   LOSARTAN  (COZAAR ) 50 MG TABLET    Take 50 mg by mouth daily.   MEPOLIZUMAB  (NUCALA ) 100 MG/ML SOAJ    INJECT 100MG  SUBCUTANEOUSLY EVERY 4 WEEKS   NAPROXEN  (NAPROSYN ) 500 MG TABLET    Take 1 tablet (500 mg total) by mouth 2 (two) times daily.   OMEPRAZOLE  (PRILOSEC  OTC) 20 MG TABLET    Take 2 tablets (40 mg total) by mouth at bedtime.   PROBIOTIC CAPS    Take 1 capsule by mouth daily.   SPACER/AERO-HOLDING CHAMBERS (AEROCHAMBER MV) INHALER    Use as instructed   TIZANIDINE  (ZANAFLEX ) 4 MG TABLET    Take 1 tablet (4 mg total) by mouth every 8 (eight) hours as needed for muscle spasms.  Modified Medications   No medications on file  Discontinued Medications   No medications on file    Allergies: Allergies[1]  Past Medical History: Past Medical History:  Diagnosis Date   Asthma    COPD (chronic obstructive pulmonary disease) (HCC)    Cough variant asthma    Diabetes (HCC)  states was told she was not diabetic tested high once on hemoglobin A1C   Dyslipidemia    Emphysema of lung (HCC)    endometrial cancer 08/2016   Total Hysterectomy 09/04/2016   HTN (hypertension)    Hypertension    Hypothyroidism    Thyroid  disease     Social History: Social History   Socioeconomic History   Marital status: Widowed    Spouse name: Not on file   Number of children: Not on file   Years of education: Not on file   Highest education level: Not on file  Occupational History   Not on file  Tobacco Use   Smoking status: Never   Smokeless tobacco: Never  Vaping Use    Vaping status: Never Used  Substance and Sexual Activity   Alcohol use: No    Alcohol/week: 0.0 standard drinks of alcohol   Drug use: No   Sexual activity: Never  Other Topics Concern   Not on file  Social History Narrative   Not on file   Social Drivers of Health   Tobacco Use: Low Risk (11/09/2023)   Patient History    Smoking Tobacco Use: Never    Smokeless Tobacco Use: Never    Passive Exposure: Not on file  Financial Resource Strain: Low Risk  (08/11/2023)   Received from [facility]   Overall Financial Resource Strain (CARDIA)    Difficulty of Paying Living Expenses: Not hard at all  Food Insecurity: No Food Insecurity (08/11/2023)   Received from [facility]   Epic    Within the past 12 months, you worried that your food would run out before you got the money to buy more.: Never true    Within the past 12 months, the food you bought just didn't last and you didn't have money to get more.: Never true  Transportation Needs: No Transportation Needs (08/11/2023)   Received from [facility] - [facility]    In the past 12 months, has lack of transportation kept you from medical appointments or from getting medications?: No    Lack of Transportation (Non-Medical): No  Physical Activity: Not on file  Stress: Not on file  Social Connections: Not on file  Depression (EYV7-0): Not on file  Alcohol Screen: Not on file  Housing: Low Risk  (10/12/2023)   Received from [facility]   Epic    In the last 12 months, was there a time when you were not able to pay the mortgage or rent on time?: No    In the past 12 months, how many times have you moved where you were living?: 0    At any time in the past 12 months, were you homeless or living in a shelter (including now)?: No  Utilities: Not At Risk (08/11/2023)   Received from [facility]   Epic    In the past 12 months has the  electric, gas, oil, or water company threatened to shut off services in your home?: No  Health Literacy: Not on file      Assessment/Plan: 1. Patient is taking Nucala  for eosinophilic asthma. Patient educated on purpose, proper use of Nucala . Administered as a SubQ injection once every 28 days.   She denies questions/concerns about Nucala  at this time.   Access:  - Obtained through insurance + grant - Can be filled at [facility]: [phone]   Patient enrolled into asthma  grant through PAF: Amount: $2000 Award Period: 07/11/2023 - 01/06/2025 ID: [id number] BIN: [id number] PCN: [id number] Group: [id number] For pharmacy inquiries, contact PDMI at [phone]. For patient inquiries, contact PAF at [phone].   Plan:  - CONTINUE Nucala  100mg  [place] every 28 days  - Rx will be triaged to [facility]  I discussed the assessment and treatment plan with the patient. The patient was provided an opportunity to ask questions and all were answered. The patient agreed with the plan and demonstrated an understanding of the instructions.   The patient was advised to call back or seek an in-person evaluation if the symptoms worsen or if the condition fails to improve as anticipated.  I provided 10 minutes of non-face-to-face time during this encounter.  Patient verbalizes understanding and agreement with plan.   Aleck Puls, PharmD, BCPS, CPP Clinical Pharmacist  [facility]     [1]  Allergies Allergen Reactions   Nsaids Nausea And Vomiting    Trialed NSAIDs twice for MSK pain with nausea/vomiting both times.   Diphenhydramine Other (See Comments)    Knocks me for a loop   Oxycodone  Itching   Sulfa Antibiotics Hives   Amoxicillin  Hives and Rash    Has patient had a PCN reaction causing immediate rash, facial/tongue/throat swelling, SOB or lightheadedness with hypotension: Yes Has patient had a PCN reaction causing severe rash  involving mucus membranes or skin necrosis: Yes Has patient had a PCN reaction that required hospitalization: No Has patient had a PCN reaction occurring within the last 10 years: Yes If all of the above answers are NO, then may proceed with Cephalosporin use.    Levaquin  [Levofloxacin  In D5w] Hives

## 2024-02-23 NOTE — Addendum Note (Signed)
 Addended by: Rashad Auld L on: 02/23/2024 12:57 PM   Modules accepted: Orders

## 2024-03-06 ENCOUNTER — Other Ambulatory Visit (HOSPITAL_COMMUNITY): Payer: Self-pay

## 2024-03-06 ENCOUNTER — Other Ambulatory Visit: Payer: Self-pay

## 2024-03-06 NOTE — Telephone Encounter (Signed)
 Submitted a Prior Authorization request to [employer] for NUCALA  via CoverMyMeds. Will update once we receive a response.  Key: [id number]

## 2024-03-07 ENCOUNTER — Other Ambulatory Visit: Payer: Self-pay

## 2024-03-07 ENCOUNTER — Other Ambulatory Visit (HOSPITAL_COMMUNITY): Payer: Self-pay

## 2024-03-07 NOTE — Telephone Encounter (Signed)
 Received notification from [facility] MEDICARE regarding a prior authorization for NUCALA . Authorization has been APPROVED from 02/17/24 to 02/15/25. Approval letter sent to scan center.  Per test claim, copay for 28 days supply is $1329.61  Patient can fill through [facility]: [phone]   Authorization # [id number] Phone # [phone]

## 2024-03-07 NOTE — Progress Notes (Signed)
 Specialty Pharmacy Initial Fill Coordination Note  Anita Kent is a 84 y.o. female contacted today regarding initial fill of specialty medication(s) Mepolizumab  (Nucala )   Patient requested Delivery   Delivery date: 03/09/24   Verified address: [address], [place], [place] [address]   Medication will be filled on: 03/08/24   Patient is aware of $0 copayment.

## 2024-03-08 ENCOUNTER — Other Ambulatory Visit: Payer: Self-pay

## 2024-03-14 ENCOUNTER — Other Ambulatory Visit: Payer: Self-pay

## 2024-03-14 NOTE — Progress Notes (Signed)
 Called & spoke with patient aware of new shipping date 01/28 for 01/29

## 2024-03-21 ENCOUNTER — Other Ambulatory Visit: Payer: Self-pay

## 2024-03-21 NOTE — Progress Notes (Signed)
 Patient continues Nucala  for asthma. Previously filled through patient assistance. See pharmacotherapy visit note 02/23/24. Counseled in detail on continued use prior to first dispense from [facility].
